# Patient Record
Sex: Female | Born: 1940 | Race: White | Hispanic: No | Marital: Married | Smoking: Current every day smoker
Health system: Southern US, Community
[De-identification: ages and names within clinical notes are randomized; demographics above are authoritative.]

## PROBLEM LIST (undated history)

## (undated) DIAGNOSIS — I471 Supraventricular tachycardia: Secondary | ICD-10-CM

## (undated) DIAGNOSIS — G40309 Generalized idiopathic epilepsy and epileptic syndromes, not intractable, without status epilepticus: Secondary | ICD-10-CM

## (undated) DIAGNOSIS — G4730 Sleep apnea, unspecified: Secondary | ICD-10-CM

## (undated) DIAGNOSIS — G4710 Hypersomnia, unspecified: Secondary | ICD-10-CM

## (undated) DIAGNOSIS — K219 Gastro-esophageal reflux disease without esophagitis: Secondary | ICD-10-CM

## (undated) DIAGNOSIS — T7840XA Allergy, unspecified, initial encounter: Secondary | ICD-10-CM

## (undated) DIAGNOSIS — I6529 Occlusion and stenosis of unspecified carotid artery: Secondary | ICD-10-CM

## (undated) DIAGNOSIS — H269 Unspecified cataract: Secondary | ICD-10-CM

## (undated) DIAGNOSIS — J45909 Unspecified asthma, uncomplicated: Secondary | ICD-10-CM

## (undated) DIAGNOSIS — R1310 Dysphagia, unspecified: Secondary | ICD-10-CM

## (undated) DIAGNOSIS — H9190 Unspecified hearing loss, unspecified ear: Secondary | ICD-10-CM

## (undated) DIAGNOSIS — I00 Rheumatic fever without heart involvement: Secondary | ICD-10-CM

## (undated) DIAGNOSIS — Z974 Presence of external hearing-aid: Secondary | ICD-10-CM

## (undated) DIAGNOSIS — R569 Unspecified convulsions: Secondary | ICD-10-CM

## (undated) DIAGNOSIS — J40 Bronchitis, not specified as acute or chronic: Secondary | ICD-10-CM

## (undated) DIAGNOSIS — R0602 Shortness of breath: Secondary | ICD-10-CM

## (undated) DIAGNOSIS — Z973 Presence of spectacles and contact lenses: Secondary | ICD-10-CM

## (undated) DIAGNOSIS — E785 Hyperlipidemia, unspecified: Secondary | ICD-10-CM

## (undated) DIAGNOSIS — G4734 Idiopathic sleep related nonobstructive alveolar hypoventilation: Secondary | ICD-10-CM

## (undated) DIAGNOSIS — J449 Chronic obstructive pulmonary disease, unspecified: Secondary | ICD-10-CM

## (undated) DIAGNOSIS — I2510 Atherosclerotic heart disease of native coronary artery without angina pectoris: Secondary | ICD-10-CM

## (undated) DIAGNOSIS — R519 Headache, unspecified: Secondary | ICD-10-CM

## (undated) DIAGNOSIS — IMO0001 Reserved for inherently not codable concepts without codable children: Secondary | ICD-10-CM

## (undated) DIAGNOSIS — R112 Nausea with vomiting, unspecified: Secondary | ICD-10-CM

## (undated) DIAGNOSIS — Z9889 Other specified postprocedural states: Secondary | ICD-10-CM

## (undated) DIAGNOSIS — M1990 Unspecified osteoarthritis, unspecified site: Secondary | ICD-10-CM

## (undated) DIAGNOSIS — R51 Headache: Secondary | ICD-10-CM

## (undated) DIAGNOSIS — F419 Anxiety disorder, unspecified: Secondary | ICD-10-CM

## (undated) DIAGNOSIS — I4719 Other supraventricular tachycardia: Secondary | ICD-10-CM

## (undated) DIAGNOSIS — E7800 Pure hypercholesterolemia, unspecified: Secondary | ICD-10-CM

## (undated) DIAGNOSIS — I509 Heart failure, unspecified: Secondary | ICD-10-CM

## (undated) DIAGNOSIS — J4489 Other specified chronic obstructive pulmonary disease: Secondary | ICD-10-CM

## (undated) DIAGNOSIS — C801 Malignant (primary) neoplasm, unspecified: Secondary | ICD-10-CM

## (undated) HISTORY — PX: ABLATION: SHX5711

## (undated) HISTORY — DX: Anxiety disorder, unspecified: F41.9

## (undated) HISTORY — DX: Pure hypercholesterolemia, unspecified: E78.00

## (undated) HISTORY — PX: SKIN CANCER EXCISION: SHX779

## (undated) HISTORY — DX: Other specified chronic obstructive pulmonary disease: J44.89

## (undated) HISTORY — DX: Unspecified osteoarthritis, unspecified site: M19.90

## (undated) HISTORY — DX: Gastro-esophageal reflux disease without esophagitis: K21.9

## (undated) HISTORY — DX: Sleep apnea, unspecified: G47.30

## (undated) HISTORY — DX: Generalized idiopathic epilepsy and epileptic syndromes, not intractable, without status epilepticus: G40.309

## (undated) HISTORY — DX: Reserved for inherently not codable concepts without codable children: IMO0001

## (undated) HISTORY — DX: Occlusion and stenosis of unspecified carotid artery: I65.29

## (undated) HISTORY — PX: TONSILLECTOMY AND ADENOIDECTOMY: SUR1326

## (undated) HISTORY — DX: Atherosclerotic heart disease of native coronary artery without angina pectoris: I25.10

## (undated) HISTORY — DX: Unspecified asthma, uncomplicated: J45.909

## (undated) HISTORY — DX: Chronic obstructive pulmonary disease, unspecified: J44.9

## (undated) HISTORY — DX: Hyperlipidemia, unspecified: E78.5

## (undated) HISTORY — DX: Hypersomnia, unspecified: G47.10

## (undated) HISTORY — DX: Shortness of breath: R06.02

## (undated) HISTORY — PX: HERNIA REPAIR: SHX51

## (undated) HISTORY — DX: Bronchitis, not specified as acute or chronic: J40

## (undated) HISTORY — DX: Heart failure, unspecified: I50.9

## (undated) HISTORY — DX: Allergy, unspecified, initial encounter: T78.40XA

## (undated) HISTORY — PX: ABDOMINAL HYSTERECTOMY: SHX81

## (undated) HISTORY — DX: Dysphagia, unspecified: R13.10

---

## 2009-09-10 ENCOUNTER — Ambulatory Visit: Payer: Self-pay | Admitting: Vascular Surgery

## 2009-12-31 ENCOUNTER — Ambulatory Visit: Payer: Self-pay | Admitting: Vascular Surgery

## 2010-03-11 ENCOUNTER — Ambulatory Visit: Payer: Self-pay | Admitting: Vascular Surgery

## 2010-03-11 DIAGNOSIS — R0602 Shortness of breath: Secondary | ICD-10-CM

## 2010-03-11 HISTORY — DX: Shortness of breath: R06.02

## 2010-10-06 ENCOUNTER — Ambulatory Visit: Payer: Self-pay | Admitting: Vascular Surgery

## 2010-10-26 ENCOUNTER — Ambulatory Visit (HOSPITAL_COMMUNITY)
Admission: RE | Admit: 2010-10-26 | Discharge: 2010-10-26 | Payer: Self-pay | Source: Home / Self Care | Attending: Vascular Surgery | Admitting: Vascular Surgery

## 2010-11-11 ENCOUNTER — Inpatient Hospital Stay (HOSPITAL_COMMUNITY)
Admission: RE | Admit: 2010-11-11 | Discharge: 2010-11-12 | Payer: Self-pay | Source: Home / Self Care | Attending: Vascular Surgery | Admitting: Vascular Surgery

## 2010-11-11 ENCOUNTER — Encounter: Payer: Self-pay | Admitting: Vascular Surgery

## 2010-11-11 HISTORY — PX: CAROTID ENDARTERECTOMY: SUR193

## 2010-11-12 NOTE — Op Note (Signed)
NAME:  [name], [name]            ACCOUNT NO.:  [id number]  MEDICAL RECORD NO.:  [id number]          PATIENT TYPE:  INP  LOCATION:  3303                         FACILITY:  [facility]  PHYSICIAN:  Di Kindle. Edilia Bo, M.D.DATE OF BIRTH:  01-31-1941  DATE OF PROCEDURE:  11/11/2010 DATE OF DISCHARGE:                              OPERATIVE REPORT   PREOPERATIVE DIAGNOSIS:  Symptomatic greater than 80% left carotid stenosis.  POSTOPERATIVE DIAGNOSIS:  Symptomatic greater than 80% left carotid stenosis.  PROCEDURES:  Left carotid endarterectomy with Dacron patch angioplasty.  SURGEON:  Di Kindle. Edilia Bo, MD  ASSISTANT:  Della Goo, PA-C.  ANESTHESIA:  General.  FINDINGS:  95% left carotid stenosis.  INDICATIONS:  This is a 70 year old woman who I have been following with carotid disease.  The carotid stenosis on the left progressed to greater than 80%.  She had had one episode of transient loss of function of theright hand, which lasted several minutes, and it was felt likely related to her left carotid stenosis.  She is brought in for left carotid endarterectomy in order to lower her risk of future stroke.  TECHNIQUE:  The patient was taken to the operating room, received a general anesthetic.  The left neck was prepped and draped in usual sterile fashion.  An incision was made along the anterior border of the sternocleidomastoid and the dissection carried down to the common carotid artery, which was dissected free and controlled with Rumel tourniquet.  Of note, the plaque extended very low in the common carotid artery, and I had to extend the incision and dissect out the common carotid artery quite low.  I then divided the facial vein between 2-0 silk ties and exposed the internal carotid artery above the plaque. There was a very bulky calcific plaque at the bifurcation.  The superior thyroid artery and external carotid arteries were also controlled.  Next, the  patient was heparinized.  Clamp was then placed on the internal then the external and common carotid artery.  A longitudinal arteriotomy was made in the common carotid artery, and this was extended to the plaque into the internal carotid artery.  A long 10 shunt was placed into the internal carotid artery then backbled and then placed in the common carotid artery and secured with Rumel tourniquet.  Flow was reestablished to the shunt.  An endarterectomy plane was established proximally, and the plaque was sharply divided.  Eversion endarterectomy was performed of the external carotid artery.  Distally, there was a nice taper in the plaque, and no tacking sutures were required.  The artery was irrigated with copious amounts of heparin and dextran, and all loose debris removed.  A long Dacron patch was then sewn using continuous 6-0 Prolene suture.  Prior to completing the patch closure, the shunt was removed.  The artery backbled and flushed appropriately, and anastomosis completed.  Flow was reestablished first to the external carotid artery and into the internal carotid artery.  At the completion, there was a good pulse distal to the patch and a good Doppler signal. Hemostasis was obtained in the wound, and the wound was closed with deep layer of 3-0  Vicryl.  The platysma was closed with running 3-0 Vicryl, and the skin closed with 4-0 subcuticular stitch. Sterile dressing was applied.  The patient tolerated the procedure well, was transferred to the recovery room in stable condition.  All needle and sponge counts were correct.     Di Kindle. Edilia Bo, M.D.     CSD/MEDQ  D:  11/11/2010  T:  11/11/2010  Job:  [id number]  cc:   Baldo Daub, MD  Electronically Signed by Waverly Ferrari M.D. on 11/12/2010 10:22:16 AM

## 2010-11-13 NOTE — Consult Note (Signed)
[name], [name] NO.:  [id number]  MEDICAL RECORD NO.:  [id number]          PATIENT TYPE:  INP  LOCATION:  3303                         FACILITY:  [facility]  PHYSICIAN:  Luis Abed, MD, FACCDATE OF BIRTH:  September 14, 1941  DATE OF CONSULTATION:  11/11/2010 DATE OF DISCHARGE:                                CONSULTATION   PRIMARY CARDIOLOGIST:  Dr. Dulce Sellar.  CARDIOVASCULAR THORACIC SURGEON:  Di Kindle. Edilia Bo, MD  REASON FOR CONSULTATION:  Chest discomfort.  HISTORY OF PRESENT ILLNESS:  [name] is a 70 year old Caucasian female with no known cardiac history but other than SVT for which she had ablation approximately 4-6 years ago (no records available or whatever about this SVT 6 years ago), but known carotid artery disease having just had left carotid endarterectomy today and known 60-79% stenosis in the right carotid.  History also is significant for hyperlipidemia, COPD (intermittent home O2 use), and long history of tobacco abuse smoking 2 packs per day since 1984, who presents now with substernal chest discomfort described as tightness in the immediate postop setting.  The patient reports a long history of intermittent chest discomfort that is difficult to pin down in terms of its character.  She describes tightness as well as heaviness in her chest that can be associated with position, deep inspiration, cough, as well as exertion.  She is unsure if her exercise tolerance has changed over the last year but she does note that she has to take breaks when doing her housework but unsure if these breaks have increased in frequency.  She denies lower extremity edema but does report chronic orthopnea.  Denies PND.  No other recent changes like fevers, chills, nausea and vomiting, although today she is mildly nauseous with her symptoms.  She does report a history of minimal diaphoresis, although she has not been diaphoretic today with her  chest discomfort.  She also reports over the last few months minimal symptoms of palpitations but nothing as severe as what she had prior to her ablation.  Currently, she is frustrated as her mouth is very dry and she is having difficulty talking.  She also is frustrated because she is not quite as clear-headed, is normal on pain medication after her left carotid endarterectomy.  The patient is still experiencing her substernal chest discomfort, especially severe with cough..  The patient had EKG completed which shows some very subtle changes in the superior lateral leads when compared to prior tracing.  Mostly nonspecific ST-T wave changes.  No cardiac enzymes are back yet but CBC and CMET unremarkable.  The patient's vital signs are close to within normal limits and stable, but respiration rate is 29, 99% on 2 liters and BP minimally elevated at 134/51.  She appears uncomfortable but not in distress currently.  PAST MEDICAL HISTORY: 1. Carotid artery disease.     a.     Left carotid endarterectomy, November 11, 2010. b.     Right carotid stenosis 60-79%. 2. Hyperlipidemia. 3. COPD (intermittent O2 at home). 4. Ongoing tobacco abuse (54 pack years, 2 ppd currently). 5. AV nodal reentrant SVT.     a.  Ablation 2005 and again in 2007. 6. GERD. 7. Gastritis. 8. Peptic ulcer. 9. History of seizure disorder. 10.S/P C-section x2. 11.S/P foot surgery. 12.S/P hernia repair.  SOCIAL HISTORY:  The patient has a 54 pack-year smoking history, currently smoking 2 ppd, no significant EtOH, no illicit drug use.  No herbal meds, regular diet, no regular exercise but independent in terms of daily activities.  FAMILY HISTORY:  Negative for premature diagnosis of coronary artery disease.  REVIEW OF SYSTEMS:  Please see HPI.  All other systems reviewed were negative.  CODE STATUS:  Full.  ALLERGIES AND INTOLERANCES: 1. CONTRAST MEDIA. 2. AVELOX.  MEDICATIONS: 1. ECASA 325 mg p.o.  daily. 2. Bethanechol 25 mg b.i.d. 3. IV antibiotics. 4. Protonix daily. 5. Dilantin. 6. Spiriva. 7. P.r.n. medications.  PHYSICAL EXAMINATION:  VITAL SIGNS:  Temperature 98.3 degrees Fahrenheit, BP 134/51 with pulse 73 and respiration rate 29, O2 saturation 99% on 2 liters nasal cannula. GENERAL:  The patient is slightly confused and hard of hearing but easily redirected and answers all questions appropriately, appears uncomfortable, and a little lethargic. HEAD:  Normocephalic, atraumatic.  Pupils are equal, round, reactive to light.  Extraocular muscles are intact.  Nares are patent without discharge.  Oropharynx without erythema or exudates. NECK:  Supple without lymphadenopathy.  No thyromegaly. HEART:  Rate is irregular with audible S1, S2 and S4.  No clicks, rubs, murmurs, or gallops.  Pulses are 2+ and equal in both upper and lower extremities bilaterally. LUNGS:  Clear to auscultation bilaterally. SKIN:  No rashes, lesions, or petechiae. ABDOMEN:  Soft, nontender, nondistended.  Normal abdominal bowel sounds. No rebound or guarding.  No hepatosplenomegaly. EXTREMITIES:  Without clubbing, cyanosis, or edema. MUSCULOSKELETAL:  Without joint deformity or effusions.  No spinal or CVA tenderness.  Cranial nerves II-XII grossly intact.  No cranial nerve VIII.  The patient is hard-of-hearing.  Strength 5/5 in all extremities and axial groups, normal sensation and normal cerebellar function.  RADIOLOGY:  None.  EKG:  NSR, 82 bpm, minimal T-wave inversion with less than 1 mm of ST depression in V4-V6 with very subtly changed from prior tracing.  No significant Q-waves.  Normal axis.  No evidence of hypertrophy, PR 142, QRS 70, and QTC 432.  LABORATORY DATA:  WBC is 9.5, HGB 15.4, HCT 45.1, PLT count is 285,000. Sodium 142, potassium 5.0, chloride 103, bicarb 26, BUN 13, creatinine 1.01, glucose 110.  Liver function tests within normal limits.  Protime 12.5, INR 0.91.   Urinalysis with small amount of blood and few squamous epithelial cells.  ASSESSMENT/PLAN:  The patient initially seen by Lenard Galloway, PA-C and then seen and thoroughly assessed by attending cardiologist, Dr. Willa Rough.  [name] is a 70 year old Caucasian female with no known cardiac history other than supraventricular tachycardia for which she underwent ablation in 2005 and 2007, but history significant for carotid artery disease for which she underwent carotid endarterectomy today, chronic obstructive pulmonary disease with home O2 intermittently, hyperlipidemia, and 54 pack-year smoking history ongoing currently at 2 packs per day who presents with chest discomfort with both typical and atypical features in the absence of any convincing objective evidence of ischemia.  Chest pain - would follow enzymes and if negative arrange for followup with Dr. Dulce Sellar.  If positive will need diagnostic left heart catheterization.  It is noteworthy that the patient had a negative nuclear stress test and normal 2-D echocardiogram 1 year ago.  Also symptoms could easily be explained by chronic obstructive pulmonary  disease.     Jarrett Ables, PAC   ______________________________ Luis Abed, MD, FACCMS/MEDQ  D:  11/11/2010  T:  11/12/2010  Job:  [id number]  Electronically Signed by Jarrett Ables PAC on 11/12/2010 01:45:45 PM Electronically Signed by Willa Rough MD FACC on 11/13/2010 09:10:41 AM

## 2010-11-15 LAB — CBC
HCT: 45.1 % (ref 36.0–46.0)
HCT: 35.9 % — ABNORMAL LOW (ref 36.0–46.0)
Hemoglobin: 15.4 g/dL — ABNORMAL HIGH (ref 12.0–15.0)
Hemoglobin: 12.7 g/dL (ref 12.0–15.0)
Hemoglobin: 12.1 g/dL (ref 12.0–15.0)
MCH: 29.9 pg (ref 26.0–34.0)
MCH: 30.3 pg (ref 26.0–34.0)
MCHC: 33.8 g/dL (ref 30.0–36.0)
MCV: 87.6 fL (ref 78.0–100.0)
Platelets: 285 10*3/uL (ref 150–400)
Platelets: 227 10*3/uL (ref 150–400)
RBC: 5.15 MIL/uL — ABNORMAL HIGH (ref 3.87–5.11)
RBC: 4 MIL/uL (ref 3.87–5.11)
WBC: 9.5 10*3/uL (ref 4.0–10.5)
WBC: 15.7 10*3/uL — ABNORMAL HIGH (ref 4.0–10.5)
WBC: 11.7 10*3/uL — ABNORMAL HIGH (ref 4.0–10.5)

## 2010-11-15 LAB — COMPREHENSIVE METABOLIC PANEL
ALT: 23 U/L (ref 0–35)
AST: 26 U/L (ref 0–37)
Albumin: 4.2 g/dL (ref 3.5–5.2)
Calcium: 9.5 mg/dL (ref 8.4–10.5)
Chloride: 103 mEq/L (ref 96–112)
Creatinine, Ser: 1.01 mg/dL (ref 0.4–1.2)
Sodium: 142 mEq/L (ref 135–145)
Total Bilirubin: 0.7 mg/dL (ref 0.3–1.2)

## 2010-11-15 LAB — BASIC METABOLIC PANEL
BUN: 8 mg/dL (ref 6–23)
BUN: 4 mg/dL — ABNORMAL LOW (ref 6–23)
CO2: 25 mEq/L (ref 19–32)
Calcium: 8 mg/dL — ABNORMAL LOW (ref 8.4–10.5)
Calcium: 7.6 mg/dL — ABNORMAL LOW (ref 8.4–10.5)
Chloride: 107 mEq/L (ref 96–112)
Creatinine, Ser: 0.81 mg/dL (ref 0.4–1.2)
Creatinine, Ser: 0.72 mg/dL (ref 0.4–1.2)
GFR calc Af Amer: >60 mL/min (ref >60)
GFR calc non Af Amer: >60 mL/min (ref >60)
GFR calc non Af Amer: >60 mL/min (ref >60)
Potassium: 4 mEq/L (ref 3.5–5.1)

## 2010-11-15 LAB — CARDIAC PANEL(CRET KIN+CKTOT+MB+TROPI)
CK, MB: 1.6 ng/mL (ref 0.3–4.0)
Total CK: 108 U/L (ref 7–177)
Troponin I: 0.02 ng/mL (ref 0.00–0.06)
Troponin I: <0.01 ng/mL (ref 0.00–0.06)

## 2010-11-15 LAB — URINALYSIS, ROUTINE W REFLEX MICROSCOPIC
Nitrite: NEGATIVE
Protein, ur: NEGATIVE mg/dL

## 2010-11-15 LAB — TYPE AND SCREEN
ABO/RH(D): O POS
Antibody Screen: NEGATIVE

## 2010-11-15 LAB — URINE MICROSCOPIC-ADD ON

## 2010-11-16 NOTE — Discharge Summary (Signed)
NAME:  [name], AVERY            ACCOUNT NO.:  [id number]  MEDICAL RECORD NO.:  [id number]          PATIENT TYPE:  INP  LOCATION:  3303                         FACILITY:  [facility]  PHYSICIAN:  Di Kindle. Edilia Bo, M.D.DATE OF BIRTH:  05-07-1941  DATE OF ADMISSION:  11/11/2010 DATE OF DISCHARGE:  11/12/2010                              DISCHARGE SUMMARY   REASON FOR ADMISSION:  Symptomatic left carotid stenosis.  HISTORY:  This is a pleasant 70 year old woman who was seen with a greater than 80% left carotid stenosis.  She had had a previous episode of transient loss of function in her right hand which had lasted several minutes, this was in the past.  She has had no further episodes.  Given the severity of the carotid stenosis and her previous symptoms, left carotid endarterectomy was recommended in order to lower her risk of future stroke.  Remainder of the history and physical is as dictated without addition or deletion.  HOSPITAL COURSE:  The patient was admitted on November 11, 2010.  She underwent an uneventful left carotid endarterectomy with Dacron patch angioplasty.  Of note, it is a very long stenosis and required extensive endarterectomy extending well down into the common carotid artery. However, she remained hemodynamically stable throughout the procedure and awoke neurologically intact.  She was monitored in the recovery room where she remained stable.  She was then transferred to the step-down unit.  Once she got to the step-down unit, she began complaining of some substernal chest pressure.  An EKG was obtained which showed some very subtle changes and nonspecific ST-T wave abnormalities with possible lateral ischemia.  She was started on intravenous nitroglycerin. Cardiac enzymes were obtained, and Cardiology consult was consulted. They evaluated the patient.  Her enzymes remained negative, and she remained stable.  On postoperative day #1, Cardiology evaluated  the patient and felt that her chest pain was not cardiac in origin.  She continued to have some vague chest pain; however, she is somewhat of a difficult historian.  She remained neurologically intact.  She was eating without difficulty, voiding and ambulating, and she was discharged home with instructions to follow up in 2 weeks in our office. She has also been asked to follow up with Dr. Dulce Sellar in [facility] her cardiologist.  DISCHARGE DIAGNOSIS:  Symptomatic left carotid stenosis.  SECONDARY DIAGNOSES: 1. Hyperlipidemia. 2. Chronic obstructive pulmonary disease. 3. History of ongoing tobacco abuse. 4. History of atrioventricular nodal reentrant supraventricular     tachycardia status post ablation in 2005 and 2007.  CONDITION ON DISCHARGE:  Good.  PROCEDURES THIS ADMISSION:  Left carotid endarterectomy with Dacron patch angioplasty on November 11, 2010.  MEDICATIONS ON DISCHARGE: 1. L-arginine 500 mg p.o. b.i.d. 2. Mucinex 1200 mg p.o. every 12 hours. 3. Alendronate 70 mg p.o. weekly. 4. Spiriva 18 mcg inhaled p.o. daily. 5. Proventil inhaler 2 puffs daily as needed. 6. Vitamin E over the counter one p.o. daily. 7. Bethanechol 25 mg p.o. b.i.d. 8. Nexium 40 mg p.o. nightly. 9. Aspirin 81 mg p.o. 2-3 times a day. 10.Dilantin 100 mg p.o. q.i.d.  CONDITION ON DISCHARGE:  Good.  PERTINENT LABORATORY EVALUATION:  This admission includes negative cardiac enzymes as described above.  Creatinine 0.72.  Hemoglobin is 12.1 on discharge.     Di Kindle. Edilia Bo, M.D.     CSD/MEDQ  D:  11/12/2010  T:  11/13/2010  Job:  [id number]  cc:   Dr. Norman Herrlich  Electronically Signed by Waverly Ferrari M.D. on 11/16/2010 03:25:57 PM

## 2010-12-01 ENCOUNTER — Ambulatory Visit (INDEPENDENT_AMBULATORY_CARE_PROVIDER_SITE_OTHER): Payer: PRIVATE HEALTH INSURANCE | Admitting: Vascular Surgery

## 2010-12-01 DIAGNOSIS — I6529 Occlusion and stenosis of unspecified carotid artery: Secondary | ICD-10-CM

## 2010-12-03 NOTE — Assessment & Plan Note (Signed)
OFFICE VISIT  [name], [name] DOB:  12-Sep-1941                                       12/01/2010 NFAOZ#:[id number]  I saw this patient in the office today for follow-up after her recent left carotid endarterectomy.  This is a pleasant 69-year woman whom I have been following with carotid disease.  The stenosis on the left progressed to greater than 80%.  She had had one episode of transient loss of function in the right hand which lasted several minutes and it was felt that the left carotid stenosis could be symptomatic.  She underwent elective left carotid endarterectomy with Dacron patch angioplasty on 11/11/2010.  She did well postoperatively and returns for her first outpatient visit.  Overall she has been doing quite well has no specific complaint.  PHYSICAL EXAMINATION:  Blood pressure is 132/84, heart rate is 74, temperature 98.1.  The neck incision is healed nicely.  She has no focal neurologic deficits.  Lungs:  Clear bilaterally to auscultation.  Overall I am pleased with her progress.  She does have a known 60-79% right carotid stenosis and will follow this closely.  She will be due for carotid duplex scan in 6 months which I have ordered.  She knows to call sooner if she has problems.  In the meantime she knows to continue taking her aspirin.    Di Kindle. Edilia Bo, M.D. Electronically Signed  CSD/MEDQ  D:  12/01/2010  T:  12/02/2010  Job:  [id number]  cc:   Norman Herrlich, M.D.

## 2011-01-03 LAB — COMPREHENSIVE METABOLIC PANEL
ALT: 19 U/L (ref 0–35)
AST: 24 U/L (ref 0–37)
Albumin: 3.9 g/dL (ref 3.5–5.2)
BUN: 11 mg/dL (ref 6–23)
Creatinine, Ser: 0.91 mg/dL (ref 0.4–1.2)
Glucose, Bld: 76 mg/dL (ref 70–99)
Sodium: 137 mEq/L (ref 135–145)
Total Bilirubin: 0.3 mg/dL (ref 0.3–1.2)
Total Protein: 6.7 g/dL (ref 6.0–8.3)

## 2011-01-03 LAB — CBC
HCT: 44.2 % (ref 36.0–46.0)
Hemoglobin: 15.3 g/dL — ABNORMAL HIGH (ref 12.0–15.0)
MCHC: 34.6 g/dL (ref 30.0–36.0)
MCV: 88.9 fL (ref 78.0–100.0)
RBC: 4.97 MIL/uL (ref 3.87–5.11)

## 2011-01-03 LAB — PROTIME-INR: Prothrombin Time: 12.6 seconds (ref 11.6–15.2)

## 2011-01-03 LAB — SURGICAL PCR SCREEN
MRSA, PCR: NEGATIVE
Staphylococcus aureus: NEGATIVE

## 2011-01-03 LAB — URINALYSIS, ROUTINE W REFLEX MICROSCOPIC
Hgb urine dipstick: NEGATIVE
Protein, ur: NEGATIVE mg/dL
Urobilinogen, UA: 0.2 mg/dL (ref 0.0–1.0)
pH: 7 (ref 5.0–8.0)

## 2011-01-03 LAB — TYPE AND SCREEN
ABO/RH(D): O POS
Antibody Screen: NEGATIVE

## 2011-03-08 NOTE — H&P (Signed)
HISTORY AND PHYSICAL EXAMINATION   Mar 11, 2010   Re:  [name], [name]              DOB:  August 10, 1941   I saw the patient in the office today for continued followup of her  carotid disease.  I have been following her with bilateral carotid  disease.  I had originally seen her in consultation in November of 2010.  She had one brief episode of loss of function of the right hand but at  the time was under a tremendous amount of stress and it was felt that it  could potentially be related to this.  Her carotid duplex scan showed a  less than 80% left carotid stenosis.  We were considering carotid  endarterectomy however she had no further symptoms and therefore we  elected to simply see her back in 6 months with a followup duplex scan.  Since I saw her last she has had no history of stroke, TIAs, expressive  or receptive aphasia or amaurosis fugax.  She continues to be under a  lot of stress and has had several deaths in the family.   PAST MEDICAL HISTORY:  Her past medical history is significant for  hypercholesterolemia and COPD.  She also had an ablation procedure I  believe by Dr. Rudolpho Sevin in [facility].  She has had no history of  myocardial infarction, congestive heart failure or diabetes.   FAMILY HISTORY:  She is unaware of any history of premature  cardiovascular disease.   SOCIAL HISTORY:  She is married.  She has five children.  She smokes two  packs per day of cigarettes and has been smoking since she was a  teenager.   ALLERGIES:  She has no known drug allergies.   MEDICATIONS:  1. Advair inhaler p.r.n.  2. Proventil inhaler p.r.n.  3. Dilantin 100 mg 2 tablets p.o. t.i.d.  4. Nexium 40 mg p.o. daily.  5. Aspirin 81 mg p.o. b.i.d.   REVIEW OF SYSTEMS:  GENERAL:  She has had no recent weight loss, weight  gain or problems with her appetite.  CARDIOVASCULAR:  She has had no recent chest pain, chest pressure,  palpitations or arrhythmias.  She does  admit to dyspnea on exertion.  PULMONARY:  She occasionally uses home O2.  She has had occasional  bronchitis and occasional wheezing and asthma.  GI:  She has a history of reflux.  GU, neurologic, musculoskeletal, psychiatric, ENT, hematologic review of  systems is unremarkable.   PHYSICAL EXAMINATION:  General:  This is a pleasant 70 year old who  appears her stated age.  Vital signs:  Her blood pressure is 108/72,  heart rate 74, respiratory rate 16.  HEENT:  Unremarkable.  Lungs:  Are  clear bilaterally to auscultation without rales, rhonchi or wheezing.  Cardiovascular:  She has bilateral carotid bruits.  She has a regular  rate and rhythm.  She has palpable femoral pulses and warm and well-  perfused feet without ischemic ulcers.  Neurologic:  She has no focal  weakness or paresthesias.   I have independently interpreted her carotid duplex scan today which  shows that her left carotid stenosis has progressed significantly.  She  now has a greater than 80% left carotid stenosis.  Peak systolic  velocity is 410 cm/sec with an end diastolic velocity of 185 cm/sec.  Both vertebral arteries are patent with normally directed flow.  She  does have a 60%-79% right carotid stenosis.   Given the  progression of the left carotid stenosis to greater than 80% I  have recommended left carotid endarterectomy in order to lower her risk  of future stroke.  We have discussed the indications for the procedure  and the potential complications including but not limited to stroke  (periprocedural risk 1%-2%), MI, nerve injury, bleeding, or other  unpredictable medical problems.  All of her questions were answered and  she is agreeable to proceed.  She knows to continue her aspirin right up  through surgery.  Her surgery has been scheduled for June 3.     Di Kindle. Edilia Bo, M.D.  Electronically Signed   CSD/MEDQ  D:  03/11/2010  T:  03/12/2010  Job:  [id number]   cc:   Dr Rudolpho Sevin  Dr Foye Deer  Dr Norman Herrlich

## 2011-03-08 NOTE — Procedures (Signed)
CAROTID DUPLEX EXAM   INDICATION:  Carotid disease.   HISTORY:  Diabetes:  No.  Cardiac:  No.  Hypertension:  No.  Smoking:  Yes.  Previous Surgery:  No.  CV History:  Currently asymptomatic.  Amaurosis Fugax No, Paresthesias No, Hemiparesis No                                       RIGHT             LEFT  Brachial systolic pressure:         122               124  Brachial Doppler waveforms:         Normal            Normal  Vertebral direction of flow:        Antegrade         Antegrade  DUPLEX VELOCITIES (cm/sec)  CCA peak systolic                   67                66  ECA peak systolic                   141               150  ICA peak systolic                   169               261  ICA end diastolic                   67                100  PLAQUE MORPHOLOGY:                  Mixed             Calcific  PLAQUE AMOUNT:                      Moderate          Moderate / severe  PLAQUE LOCATION:                    ICA / ECA         ICA / ECA / CCA   IMPRESSION:  1. 40%-59% stenosis of the right proximal internal carotid artery.  2. Doppler velocity suggests a 60%-79% stenosis of the left proximal      internal carotid artery, however, the stenosis may be      underestimated due to overlying calcific plaque shadowing.   ___________________________________________  Di Kindle. Edilia Bo, M.D.   CH/MEDQ  D:  09/10/2009  T:  09/10/2009  Job:  [id number]

## 2011-03-08 NOTE — Consult Note (Signed)
NEW PATIENT CONSULTATION   [name], [name]  DOB:  Jul 07, 1941                                       09/10/2009  CHART#:[id number]   I saw the patient in the office today in consultation concerning  bilateral carotid disease.  This is a pleasant 70 year old right-handed  woman who when she was driving had a very brief episode where she could  not see that lasted only a second or two.  I do not get any clear-cut  history of amaurosis fugax.  Her symptoms were not limited to one side.  She denies any previous history of stroke or history of expressive or  receptive aphasia.  She eventually acknowledged that she had had an  episode about a month ago where she had temporarily lost function of her  hand for a few minutes where she could not hold her cigarette and I  think this very well may have been a left hemispheric TIA.  She felt she  has been under a lot of stress because of her mother's been sick and she  tries to attribute most of her symptoms to stress.   PAST MEDICAL HISTORY:  Is significant for hypercholesterolemia and COPD.  In addition, she has had some arrhythmias and has been worked up by Dr.  Philipp Deputy at [facility] for I believe an ablation procedure.  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  Her cardiologist is Dr. Dulce Sellar.  She  denies any history of diabetes, hypertension, history of previous  myocardial infarction or history of congestive heart failure.   FAMILY HISTORY:  There is no history of premature cardiovascular  disease.   SOCIAL HISTORY:  She is married.  She has 5 children.  She smokes 2  packs per day of cigarettes and has been smoking for as long as she can  remember.   ALLERGIES:  No known drug allergies.   MEDICATIONS:  Are documented on the medical history form in her chart.   REVIEW OF SYSTEMS:  GENERAL:  She says she has had some weight loss and  has lost about 15 pounds in the last 6 months which she attributes to  poor  appetite.  She has had no fever.  CARDIAC:  She admits to occasional chest pain and chest pressure with  sometimes pressure in the left shoulder.  She admits to orthopnea and  dyspnea on exertion.  PULMONARY:  She is on home O2 p.r.n. and has had occasional episodes of  bronchitis.  GI:  She has a history of reflux.  VASCULAR:  She does admit to claudication and some rest pain in her  feet.  She has had no history of DVT or phlebitis.  NEUROLOGIC:  She has occasional headaches and has had seizures in the  past.  MUSCULOSKELETAL:  She does have arthritis and osteoporosis.  GU, psychiatric, ENT, hematologic and integument review of systems is  unremarkable.   PHYSICAL EXAMINATION:  General:  This is a pleasant 70 year old woman  who appears her stated age.  Vital signs:  Blood pressure 133/84, heart  rate is 73, temperature is 97.6.  HEENT:  Extraocular motions are  intact.  Conjunctivae are normal.  There is no facial asymmetry.  Neck:  Supple.  There is no jugular venous distention.  There is no cervical  lymphadenopathy.  Lungs:  Are clear bilaterally  to auscultation without  rales, rhonchi or wheezing.  Cardiovascular:  She has a harsh left  carotid bruit with a softer right carotid bruit.  She has a regular rate  and rhythm without murmur appreciated.  She has no significant  peripheral edema.  She has slight diminished femoral pulses.  I cannot  palpate pedal pulses although both feet appear adequately perfused  without ischemic ulcers.  Abdomen:  Soft and nontender.  I cannot  palpate an aneurysm.  She has normal pitched bowel sounds.  Musculoskeletal:  There are no major deformities or cyanosis.  Neurological:  Exam is nonfocal with no focal weakness or paresthesias.  Skin:  She has no rashes or ulcers.   She did have a carotid duplex scan done at [facility] which  suggested a 60%-79% right carotid stenosis with an 80%-99% left carotid  stenosis based on velocity  criteria.  This study was done on September 04, 2009.  We repeated her carotid duplex scan here which I have  independently interpreted and this showed only 40%-59% carotid stenosis  on the right with a 60%-79% left carotid stenosis.  It is certainly  possible that if she had a hemorrhagic plaque which disrupted that this  could explain the change in the findings on her carotid duplex scan  although it could simply be difference in lab technique.   I have also reviewed her lab studies which show a normal creatinine and  no significant anemia.   Given her symptoms of temporary weakness in the right hand with a  significant left carotid stenosis I think consideration should be given  to left carotid endarterectomy in order to lower her risk of future  stroke.  I feel the risk of stroke without surgery is approximately 10%  in the next year and then 5% per year thereafter.  I have explained that  we can significantly lower her risk of stroke with carotid  endarterectomy and I have reviewed the procedure and the potential  complications including but not limited to bleeding, stroke (peri  procedural risk 1%-2%), nerve injury, MI or other unpredictable medical  problems.  However, I am somewhat concerned about her cardiac history  and she tells me she has occasional chest pain and chest pressure  sometimes with radiation to the left shoulder.  I have asked for her to  make an appointment with Dr. Dulce Sellar to assess her for a preoperative  cardiac evaluation.  Once this workup is complete I will see her back  and we will discuss potentially proceeding with left carotid  endarterectomy.  In the meantime I have encouraged her to continue  taking her aspirin and we have had a long discussion about the  importance of tobacco cessation.  If she had an embolic event from her  left carotid plaque that the stenosis could change.  She denies any  history of diabetes.  She has had no previous history of  stroke, TIAs,  expressive or receptive aphasia.  She did tell me after...   Di Kindle. Edilia Bo, M.D.  Electronically Signed   CSD/MEDQ  D:  09/10/2009  T:  09/11/2009  Job:  [id number]   cc:   Dr Foye Deer  Dr Norman Herrlich  Dr Philipp Deputy

## 2011-03-08 NOTE — Procedures (Signed)
CAROTID DUPLEX EXAM   INDICATION:  Follow up known carotid disease.   HISTORY:  Diabetes:  No.  Cardiac:  No.  Hypertension:  No.  Smoking:  Yes.  Previous Surgery:  No.  CV History:  Asymptomatic.  Amaurosis Fugax No, Paresthesias No, Hemiparesis No.                                       RIGHT             LEFT  Brachial systolic pressure:         125               149  Brachial Doppler waveforms:         Normal            Normal  Vertebral direction of flow:        Antegrade         Antegrade  DUPLEX VELOCITIES (cm/sec)  CCA peak systolic                   61                67  ECA peak systolic                   82                110  ICA peak systolic                   231               369  ICA end diastolic                   97                186  PLAQUE MORPHOLOGY:                  Mixed             Mixed  PLAQUE AMOUNT:                      Moderate-to-severe                  Severe  PLAQUE LOCATION:                    ICA, ECA, CCA     ICA, ECA, CCA   IMPRESSION:  1. Doppler velocities suggest 60% to 79% stenosis in the right      internal carotid artery.  2. Doppler velocities suggest 80% to 99% stenosis in the left internal      carotid artery.  3. Antegrade flow noted in the bilateral vertebral arteries.  4. No significant change from previous examination.   ___________________________________________  Di Kindle. Edilia Bo, M.D.   NT/MEDQ  D:  10/06/2010  T:  10/06/2010  Job:  [id number]

## 2011-03-08 NOTE — Assessment & Plan Note (Signed)
OFFICE VISIT   AMBUR, PROVINCE  DOB:  [birth year]                                       12/31/2009  WJXBJ#:[id number]   I saw the patient in the office today for followup of her carotid  disease.  I had originally seen her in consultation on 09/10/2009.  She  had had an episode a month prior to this visit in October when she  temporarily lost function of her right hand for a few minutes and could  not hold her cigarette.  Of note, she had been under tremendous amount  of stress at that time as her mother was sick and initially she  attributed her symptoms to stress.  She ultimately underwent a carotid  duplex scan at [facility] which showed a greater than 80% left  carotid stenosis by velocity criteria.  She was later referred to our  office.  We repeated the duplex scan and the stenosis on the left was in  the 60%-79% range but was clearly less than 80%.  We were considering  left carotid endarterectomy, however, I was concerned because she had  been having some chest pain.  She was referred to Dr. Dulce Sellar and  underwent a stress test which showed no evidence of ischemia.  This was  done on January 18.  She returns now today for continued followup of her  carotid disease.   Since I saw her last she has had no history of stroke, TIAs, expressive  or receptive aphasia, or amaurosis fugax.  Thus it has been  approximately 4 months since her brief episode of weakness in the right  hand.   SOCIAL HISTORY:  She is married.  She has five children.  She smokes two  packs per day of cigarettes.  She does not drink alcohol on a regular  basis.   REVIEW OF SYSTEMS:  CARDIOVASCULAR:  She has had no further chest pain  or chest pressure.  She has had no palpitations or arrhythmias.  She has  no orthopnea or dyspnea on exertion.  She has had no claudication, rest  pain or nonhealing ulcers.  She has had no history of DVT.  PULMONARY:  She has had no  productive cough, bronchitis, asthma or  wheezing.   PHYSICAL EXAMINATION:  General:  This is a pleasant day 70 year old  woman who appears her stated age.  Vital signs:  Blood pressure is  133/100 in the right arm and 148/80 in the left arm.  Heart rate is 77.  Respiratory rate 16.  Lungs:  Are clear bilaterally to auscultation  without rales, rhonchi or wheezing.  Cardiovascular:  She has bilateral  carotid bruits.  She has a regular rate and rhythm without murmur  appreciated.  She has palpable femoral pulses.  I cannot palpable pedal  pulses, however, both feet are adequately perfused.  Abdomen:  Soft and  nontender with normal pitched bowel sounds.  Neurologic:  Exam is  nonfocal.   Given that she has had no further symptoms and the stenosis on the left  is less than 80% I have recommended that we simply obtain a carotid  duplex scan in 2 months which would be 6 months from her last study.  She was under tremendous amount of stress when she had her brief episode  in October and her symptoms could simply be  related to this.  If her  stenosis on the left does progress to greater than 80% then I think we  should consider carotid endarterectomy or obviously if she develops any  new left hemispheric symptoms.  However, at this point she has not had  any further symptoms and the duplex suggests the stenosis is less than  80%.  I will see her back in 2 months with followup duplex scan.  She  knows to call sooner if she has problems.     Di Kindle. Edilia Bo, M.D.  Electronically Signed   CSD/MEDQ  D:  12/31/2009  T:  01/01/2010  Job:  [id number]   cc:   Dr Norman Herrlich  Dr Foye Deer  Dr Jeanne Ivan

## 2011-03-08 NOTE — H&P (Signed)
HISTORY AND PHYSICAL EXAMINATION   October 06, 2010   Re:  [name], Wallace              DOB:  11-11-1940   I saw Ms. [name] in the office today for any follow-up of her carotid  disease.  This is a pleasant 70 year old woman who I had last seen in  March 2011 with a less than 80% left carotid stenosis and 60-79% right  carotid stenosis.  On follow-up duplex scan in May 2011, the stenosis on  the left had progressed to greater than 80% and we discussed proceeding  with left carotid endarterectomy.  She subsequently developed bronchitis  and then was transiently lost to follow-up.  She returns today to  discuss potentially proceeding with left carotid endarterectomy.  Since  I saw her in March, she has had no history of stroke, TIAs, expressive  or receptive aphasia, or amaurosis fugax.  She has had 1 episode in the  past where she had a transient loss of function in her right hand which  lasted several minutes.  She has had no further episodes.  She has  recently been treated for bronchitis and this appears to be improving.  She says she typically gets bronchitis in the spring and the fall.   PAST MEDICAL HISTORY:  Significant for hypercholesterolemia and COPD.  She has also had an ablation procedure in the past and cardiologist is  Dr. Dulce Sellar.  I believe Dr. __________  performed her ablation procedure  in [facility].  She denies any previous history of myocardial  infarction, history of congestive heart failure.   SOCIAL HISTORY:  She is married.  She has 5 children.  She smokes 2 pack  per day of cigarettes.  She has never had success in quitting.   FAMILY HISTORY:  There is no history of premature cardiovascular  disease.   ALLERGIES:  Codeine.   MEDICATIONS:  1. Alendronate sodium 70 mg weekly.  2. Advair inhaler p.r.n.  3. Proventil inhaler p.r.n.  4. Dilantin mg 2 capsules p.o. t.i.d.  5. Nexium 40 mg daily p.r.n.  6. Guaifenesin 400 mg p.o. q.4  h p.r.n. cough.  7. Aspirin 81 mg p.o. b.i.d.  8. Vitamin E 800 international units daily.  9. L-Arginine daily.   REVIEW OF SYSTEMS:  GENERAL:  She had no recent weight loss, weight gain  or problems with her appetite.  CARDIOVASCULAR:  She has had no chest pain, chest pressure or  palpitations.  She does admit to some dyspnea on exertion and orthopnea.  She has had no claudication, rest pain or nonhealing ulcers.  She has  had a previous DVT in the past.  GI: She does have a history of reflux and occasional problems  swallowing.  NEUROLOGIC:  She has had headaches and seizures in the past.  PULMONARY:  She has had bronchitis recently and is recovering from this.  She has occasional episodes of asthma or wheezing.  GU: She has some dysuria.  ENT: She has some gradual change her eyesight but no sudden change.  MUSCULOSKELETAL:  She does have arthritis.  Hematologic, psychiatric, integumentary review of systems is  unremarkable and documented on the medical history form in the chart.   PHYSICAL EXAMINATION:  This is a pleasant 70 year old who appears her  stated age.  Blood pressure is 125/77, heart rate is 75, respiratory rate is 24.  HEENT:  Unremarkable.  LUNGS:  Clear bilaterally to auscultation without rales, rhonchi or  wheezing.  CARDIOVASCULAR:  She has a left carotid bruit.  She has a regular rate  and rhythm.  She has palpable femoral pulses and warm well-perfused feet  without ischemic ulcers.  She has no significant lower extremity  swelling.  ABDOMEN:  Soft, nontender with normal pitched bowel sounds.  MUSCULOSKELETAL:  No major deformities or cyanosis.  NEUROLOGIC:  She has no focal weakness or paresthesias.  SKIN:  There are no ulcers or rashes.   I have independently interpreted her carotid duplex scan today which  shows a greater than 80% left carotid stenosis with a 60-79% right  carotid stenosis.  End-diastolic velocity on the left is 186 cm/sec  suggesting a  very tight stenosis.  Both vertebral arteries are patent  with normally directed flow.   Given the severity of the left carotid stenosis, I have recommended left  carotid endarterectomy in order to lower her risk of future stroke.  We  have discussed indications for procedure and the potential complications  including but not limited to bleeding, stroke (peri procedural risk of  2%), nerve injury, MI, or other unpredictable medical problems.  All  questions were answered and she is agreeable to proceed.  She is  recovering from bronchitis and would prefer to wait until early January  to schedule her surgery.  Her surgery has tentatively been scheduled for  January 3.  She knows to continue taking her aspirin and let us know if  she develops any new neurologic symptoms.     Di Kindle. Edilia Bo, M.D.  Electronically Signed   CSD/MEDQ  D:  10/06/2010  T:  10/07/2010  Job:  [id number]   cc:   Dr. Norman Herrlich

## 2011-03-08 NOTE — Procedures (Signed)
CAROTID DUPLEX EXAM   INDICATION:  Followup known carotid disease.   HISTORY:  Diabetes:  No.  Cardiac:  No.  Hypertension:  No.  Smoking:  Yes.  Previous Surgery:  No.  CV History:  Asymptomatic.  Amaurosis Fugax No, Paresthesias No, Hemiparesis No                                       RIGHT             LEFT  Brachial systolic pressure:         120               122  Brachial Doppler waveforms:         Triphasic         Triphasic  Vertebral direction of flow:        Antegrade         Antegrade  DUPLEX VELOCITIES (cm/sec)  CCA peak systolic                   94                75  ECA peak systolic                   116               178  ICA peak systolic                   205               410  ICA end diastolic                   83                185  PLAQUE MORPHOLOGY:                  Mixed             Mixed  PLAQUE AMOUNT:                      Mild to severe    Severe  PLAQUE LOCATION:                    ICA, ECA          ICA, ECA, CCA   IMPRESSION:  1. Right internal carotid artery velocity suggests 60%-79% stenosis.  2. Left internal carotid artery velocity suggests 80%-99% stenosis.  3. Antegrade flow in bilateral vertebral arteries.  4. Significant change from previous exam.   ___________________________________________  Di Kindle. Edilia Bo, M.D.   NT/MEDQ  D:  03/11/2010  T:  03/11/2010  Job:  [id number]

## 2011-06-01 ENCOUNTER — Other Ambulatory Visit: Payer: PRIVATE HEALTH INSURANCE

## 2011-06-01 ENCOUNTER — Ambulatory Visit: Payer: PRIVATE HEALTH INSURANCE | Admitting: Vascular Surgery

## 2011-06-02 ENCOUNTER — Encounter: Payer: Self-pay | Admitting: Physician Assistant

## 2011-06-03 ENCOUNTER — Encounter: Payer: Self-pay | Admitting: Physician Assistant

## 2011-06-03 ENCOUNTER — Other Ambulatory Visit (INDEPENDENT_AMBULATORY_CARE_PROVIDER_SITE_OTHER): Payer: Medicare Other

## 2011-06-03 ENCOUNTER — Ambulatory Visit (INDEPENDENT_AMBULATORY_CARE_PROVIDER_SITE_OTHER): Payer: Medicare Other | Admitting: Physician Assistant

## 2011-06-03 VITALS — BP 144/82 | HR 73

## 2011-06-03 DIAGNOSIS — I6529 Occlusion and stenosis of unspecified carotid artery: Secondary | ICD-10-CM

## 2011-06-03 DIAGNOSIS — Z48812 Encounter for surgical aftercare following surgery on the circulatory system: Secondary | ICD-10-CM

## 2011-06-03 NOTE — Progress Notes (Signed)
Subjective:     Patient ID: [name], female   DOB: [birth year], 70 y.o.   MRN: [id number]  HPI  Pt presents for 6 month f/u of known carotid stenosis with duplex.  She is s/p left CEA 10/2010 by [name].  Prior to surgery she had complained of one episode of transient loss of function in the right hand which lasted several minutes and this was thought to be due to the left carotid stenosis.  At her initial f/u after surgery she was without complaint.  Due to her 60-79% RICAS she was scheduled for repeat duplex and f/u in 6 months.  Today she complains of multiple non focal neurologic sx including posterior headaches almost daily, intermittent loss of function in the right hand, "cloudy" vision, and questionable aphasia.  She denies frank loss of vision and states she does have cataracts.  She denies facial droop, CP, SOB, N/V, and claudication.  She continues to smoke 2 ppd and states that she is "down from 4 packs per day".    Carotid duplex today reveals RICAS of 60-79% which is unchanged since 09/2010 and patent LICA with endarterectomy. However, there are elevated velocities in the mid segment suggestive of stenosis in the 40-59% range. There is patent antegrade vertebrals bilaterally.  Past Medical History  Diagnosis Date  . COPD (chronic obstructive pulmonary disease)   . Reflux   . Shortness of breath 03/11/2010    while laying still  . Hyperlipidemia   . Osteoporosis   . Arthritis   . Bronchitis   . Anxiety   . Pure hypercholesterolemia   . Hypersomnia with sleep apnea, unspecified   . Dysphagia, unspecified   . Chronic airway obstruction, not elsewhere classified   . Generalized convulsive epilepsy without mention of intractable epilepsy     Past Surgical History  Procedure Date  . Carotid endarterectomy 11/11/2010    left with Dacron patch angioplasty  . Hernia repair   . Cesarean section     2 c-sections  . Tonsillectomy and adenoidectomy   . Abdominal  hysterectomy    Allergies  Allergen Reactions  . Codeine    History   Social History  . Marital Status: Married    Spouse Name: N/A    Number of Children: N/A  . Years of Education: N/A   Occupational History  . Not on file.   Social History Main Topics  . 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 Smokeless tobacco: Not on file  . Alcohol Use: No  . Drug Use: Not on file  . Sexually Active:    Other Topics Concern  . Not on file   Social History Narrative  . No narrative on file     Review of Systems  Respiratory: Negative.   Cardiovascular: Negative.   Gastrointestinal: Negative.   Neurological: Positive for weakness, numbness and headaches. Negative for dizziness, tremors, seizures, syncope, facial asymmetry, speech difficulty and light-headedness.  Hematological: Negative.   Psychiatric/Behavioral: Negative.        Objective:   Physical Exam  Constitutional: She is oriented to person, place, and time. She appears well-developed and well-nourished.  HENT:  Head: Normocephalic and atraumatic.  Eyes: EOM are normal. Pupils are equal, round, and reactive to light.  Neck: Normal range of motion. No JVD present.  Cardiovascular: Normal rate and regular rhythm.   No murmur heard. Pulmonary/Chest: Effort normal and breath sounds normal.  Abdominal:  Soft. Bowel sounds are normal. There is no tenderness. There is no guarding.  Musculoskeletal: Normal range of motion.  Neurological: She is alert and oriented to person, place, and time. No cranial nerve deficit. Coordination normal.  Skin: Skin is warm and dry.  Psychiatric: Her behavior is normal. Thought content normal.  Left carotid incision well healed.     Assessment:     ECCVOD s/p left CEA 10/2010 with recurrent stenosis at mid segment and unchanged RICAS 60-79%.  Pt has multiple non focal neurologic sx.      Plan:     Smoking cessation  discussed Refer to neurology for eval of neurologic sx and r/o TIAs F/U with [name] in 3 months with repeat carotid duplex Pt knows to call with questions, issues, or problems. Plan discussed with Dr. Imogene Burn who is in agreement.  Dr. Imogene Burn is the physician in the clinic today.

## 2011-06-13 NOTE — Procedures (Unsigned)
CAROTID DUPLEX EXAM  INDICATION:  Followup carotid stenosis.  HISTORY: Diabetes:  No. Cardiac:  No. Hypertension:  No. Smoking:  Yes. Previous Surgery:  Left carotid endarterectomy on 11/11/2010. CV History:  Currently experiencing vision disturbances and headaches. Amaurosis Fugax No, Paresthesias Yes, Hemiparesis No                                      RIGHT             LEFT Brachial systolic pressure:         138               136 Brachial Doppler waveforms:         WNL               WNL Vertebral direction of flow:        Antegrade         Antegrade DUPLEX VELOCITIES (cm/sec) CCA peak systolic                   67                90 ECA peak systolic                   128               74 ICA peak systolic                   225               39 - P/124 - M ICA end diastolic                   77                19 - P/57 - M PLAQUE MORPHOLOGY:                  Calcified         Heterogeneous PLAQUE AMOUNT:                      Moderate to severe                  Moderate PLAQUE LOCATION:                    CCA, ICA          ICA mid  IMPRESSION: 1. Right internal carotid artery stenosis in the 60%-79% range. 2. Left internal carotid artery is patent with history of     endarterectomy, with elevated velocities in the mid segment     suggestive of stenosis in the 40%-59% range. 3. Patent bilateral external carotid arteries. 4. Patent antegrade bilateral vertebral arteries. 5. Unchanged on the right since study on 10/06/2010.  ___________________________________________ Di Kindle. Edilia Bo, M.D.  SH/MEDQ  D:  06/03/2011  T:  06/03/2011  Job:  [id number]

## 2011-07-18 ENCOUNTER — Other Ambulatory Visit: Payer: Self-pay | Admitting: Neurology

## 2011-07-18 DIAGNOSIS — F17200 Nicotine dependence, unspecified, uncomplicated: Secondary | ICD-10-CM

## 2011-07-18 DIAGNOSIS — I779 Disorder of arteries and arterioles, unspecified: Secondary | ICD-10-CM

## 2011-07-18 DIAGNOSIS — R569 Unspecified convulsions: Secondary | ICD-10-CM

## 2011-07-18 DIAGNOSIS — R4701 Aphasia: Secondary | ICD-10-CM

## 2011-07-23 ENCOUNTER — Other Ambulatory Visit: Payer: Medicare Other

## 2011-08-01 ENCOUNTER — Ambulatory Visit
Admission: RE | Admit: 2011-08-01 | Discharge: 2011-08-01 | Disposition: A | Discharge status: Home / Self Care | Payer: Medicare Other | Source: Ambulatory Visit | Attending: Neurology | Admitting: Neurology

## 2011-08-01 ENCOUNTER — Other Ambulatory Visit: Payer: Self-pay | Admitting: Neurology

## 2011-08-01 DIAGNOSIS — R569 Unspecified convulsions: Secondary | ICD-10-CM

## 2011-08-01 DIAGNOSIS — Z77018 Contact with and (suspected) exposure to other hazardous metals: Secondary | ICD-10-CM

## 2011-08-01 DIAGNOSIS — I779 Disorder of arteries and arterioles, unspecified: Secondary | ICD-10-CM

## 2011-08-01 DIAGNOSIS — F17200 Nicotine dependence, unspecified, uncomplicated: Secondary | ICD-10-CM

## 2011-08-01 DIAGNOSIS — R4701 Aphasia: Secondary | ICD-10-CM

## 2011-09-06 ENCOUNTER — Encounter: Payer: Self-pay | Admitting: Vascular Surgery

## 2011-09-07 ENCOUNTER — Ambulatory Visit (INDEPENDENT_AMBULATORY_CARE_PROVIDER_SITE_OTHER): Payer: Medicare Other | Admitting: Vascular Surgery

## 2011-09-07 ENCOUNTER — Encounter: Payer: Self-pay | Admitting: Vascular Surgery

## 2011-09-07 ENCOUNTER — Other Ambulatory Visit (INDEPENDENT_AMBULATORY_CARE_PROVIDER_SITE_OTHER): Payer: Medicare Other

## 2011-09-07 VITALS — BP 110/70 | HR 68 | Resp 12 | Ht 62.0 in | Wt 118.0 lb

## 2011-09-07 DIAGNOSIS — Z48812 Encounter for surgical aftercare following surgery on the circulatory system: Secondary | ICD-10-CM

## 2011-09-07 DIAGNOSIS — I6529 Occlusion and stenosis of unspecified carotid artery: Secondary | ICD-10-CM

## 2011-09-07 DIAGNOSIS — Z9889 Other specified postprocedural states: Secondary | ICD-10-CM

## 2011-09-07 NOTE — Progress Notes (Signed)
[facility] [place]  Patient name: [name] MRN: [id number] DOB: [birth year] Sex: female  CC: followup of carotid disease  HPI: [name] is a 70 y.o. female who underwent a left carotid endarterectomy for symptomatic left carotid stenosis on 11/11/2010. She had duplex scan on 06/03/2011 which suggested some mild recurrent disease in the mid segment of her carotid endarterectomy site and a 40-59% range. In addition she had a 60-79% right carotid stenosis. Comes in for a followup duplex scan.  She's had no focal weakness or paresthesias. She did undergo an MRI after her last visit which showed no evidence of stroke. She continues to have some chronic headache. There are no aggravating or alleviating factors associated with this.  Past Medical History  Diagnosis Date  . COPD (chronic obstructive pulmonary disease)   . Reflux   . Shortness of breath 03/11/2010    while laying still  . Hyperlipidemia   . Osteoporosis   . Arthritis   . Bronchitis   . Anxiety   . Pure hypercholesterolemia   . Hypersomnia with sleep apnea, unspecified   . Dysphagia, unspecified   . Chronic airway obstruction, not elsewhere classified   . Generalized convulsive epilepsy without mention of intractable epilepsy     History reviewed. No pertinent family history.  SOCIAL HISTORY: History  Substance Use Topics  . 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 Smokeless tobacco: Not on file  . Alcohol Use: No    Allergies  Allergen Reactions  . Codeine     Current Outpatient Prescriptions  Medication Sig Dispense Refill  . Albuterol (PROVENTIL IN) Inhale into the lungs as needed.        Marland Kitchen alendronate (FOSAMAX) 70 MG tablet Take 70 mg by mouth every 7 (seven) days. Take with a full glass of water on an empty stomach.       Marland Kitchen aspirin 81 MG tablet Take 81 mg by mouth 2 (two) times daily.        . chlorhexidine  (PERIDEX) 0.12 % solution       . esomeprazole (NEXIUM) 40 MG capsule Take 40 mg by mouth daily as needed.        . Fluticasone-Salmeterol (ADVAIR HFA IN) Inhale into the lungs.        . L-ARGININE PO Take by mouth daily.        Marland Kitchen Phenytoin (DILANTIN PO) Take 100 mg by mouth 3 (three) times daily. 2 cap       . predniSONE (DELTASONE) 10 MG tablet Take 10 mg by mouth daily as needed.        . vitamin E 800 UNIT capsule Take 800 Units by mouth daily.          REVIEW OF SYSTEMS: Arly.Keller ] denotes positive finding; [  ] denotes negative finding CARDIOVASCULAR:  Arly.Keller ] chest pain stable  [ ]  chest pressure   [ ]  palpitations   [ ]  orthopnea   [ ]  dyspnea on exertion   [ ]  claudication   [ ]  rest pain   [ ]  DVT   [ ]  phlebitis PULMONARY:   [ ]  productive cough   [ ]  asthma   [ ]  wheezing NEUROLOGIC:   [ ]  weakness  [ ]  paresthesias  [ ]  aphasia  [ ]  amaurosis  [ ]  dizziness Arly.Keller ] headaches chronic HEMATOLOGIC:   [ ]  bleeding problems   [ ]   clotting disorders MUSCULOSKELETAL:  [ ]  joint pain   [ ]  joint swelling [ ]  leg swelling GASTROINTESTINAL: [ ]   blood in stool  [ ]   hematemesis GENITOURINARY:  [ ]   dysuria  [ ]   hematuria PSYCHIATRIC:  [ ]  history of major depression INTEGUMENTARY:  [ ]  rashes  [ ]  ulcers CONSTITUTIONAL:  [ ]  fever   [ ]  chills  PHYSICAL EXAM: Filed Vitals:   09/07/11 1109 09/07/11 1110  BP: 114/78 110/70  Pulse: 69 68  Resp: 16 12  Height: 5\' 2"  (1.575 m)   Weight: 118 lb (53.524 kg)   SpO2: 98%    Body mass index is 21.58 kg/(m^2). GENERAL: The patient is a well-nourished female, in no acute distress. The vital signs are documented above. CARDIOVASCULAR: There is a regular rate and rhythm without significant murmur appreciated. I do not detect any carotid bruits. PULMONARY: There is good air exchange bilaterally without wheezing or rales. ABDOMEN: Soft and non-tender with normal pitched bowel sounds.  MUSCULOSKELETAL: There are no major deformities or  cyanosis. NEUROLOGIC: No focal weakness or paresthesias are detected. SKIN: There are no ulcers or rashes noted. PSYCHIATRIC: The patient has a normal affect.  DATA:  Independently interpreted her carotid duplex scan today which shows that the mild recurrent stenosis on the left has resolved.she has a 60-79% right carotid stenosis although the velocities are slightly improved compared to the study 3 months ago.  MEDICAL ISSUES: Reassured her that her carotid endarterectomy site on the left is widely patent and that her stenosis on the right has actually improved slightly. Carlos we'll need to follow this closely. I've ordered a followup carotid duplex scan in 6 months and I'll see her back at that time. She knows to call sooner she has problems. In addition we've discussed the importance of tobacco cessation and I've offered to refer her to the tobacco cessation clinic at [place], however she states that she can not to travel to [facility] for this.  Chudney Scheffler S [facility] [place] Office: [phone]

## 2011-09-14 NOTE — Procedures (Unsigned)
CAROTID DUPLEX EXAM  INDICATION:  Carotid artery stenosis.  HISTORY: Diabetes:  No. Cardiac:  Tachycardia with subsequent ablation. Hypertension:  No. Smoking:  Yes. Previous Surgery:  Left carotid endarterectomy with DPA, on 11/11/10. CV History: Amaurosis Fugax No paresthesias No, Hemiparesis No.                                      RIGHT             LEFT Brachial systolic pressure:         120               128 Brachial Doppler waveforms:         Triphasic         Triphasic Vertebral direction of flow:        Antegrade         Antegrade DUPLEX VELOCITIES (cm/sec) CCA peak systolic                   79                99 ECA peak systolic                   112               116 ICA peak systolic                   162               38 - prox/87 - mid ICA end diastolic                   77                25 - prox/41 - mid PLAQUE MORPHOLOGY:                  Mixed, mostly calcified             Homogeneous PLAQUE AMOUNT:                      Moderate          Mild PLAQUE LOCATION:                    CCA, ICA          CCA  IMPRESSION: 1. 60% to 79% right internal carotid artery stenosis. 2. Patent left internal carotid artery, status post carotid     endarterectomy with velocities within normal limits. 3. Note: This study represents no significant change when compared     with the previous examination performed on 06/03/2011.  ___________________________________________ Di Kindle. Edilia Bo,  M.D.  CI/MEDQ  D:  09/07/2011  T:  09/07/2011  Job:  [id number]

## 2012-03-13 ENCOUNTER — Encounter: Payer: Self-pay | Admitting: Neurosurgery

## 2012-03-14 ENCOUNTER — Ambulatory Visit (INDEPENDENT_AMBULATORY_CARE_PROVIDER_SITE_OTHER): Payer: Medicare Other | Admitting: Vascular Surgery

## 2012-03-14 ENCOUNTER — Ambulatory Visit (INDEPENDENT_AMBULATORY_CARE_PROVIDER_SITE_OTHER): Payer: Medicare Other | Admitting: Neurosurgery

## 2012-03-14 ENCOUNTER — Encounter: Payer: Self-pay | Admitting: Neurosurgery

## 2012-03-14 VITALS — BP 128/77 | HR 67 | Resp 16 | Ht 62.0 in | Wt 122.5 lb

## 2012-03-14 DIAGNOSIS — I6529 Occlusion and stenosis of unspecified carotid artery: Secondary | ICD-10-CM

## 2012-03-14 DIAGNOSIS — Z9889 Other specified postprocedural states: Secondary | ICD-10-CM

## 2012-03-14 DIAGNOSIS — Z48812 Encounter for surgical aftercare following surgery on the circulatory system: Secondary | ICD-10-CM

## 2012-03-14 NOTE — Progress Notes (Signed)
Addended by: Sharee Pimple on: 03/14/2012 12:32 PM   Modules accepted: Orders

## 2012-03-14 NOTE — Progress Notes (Signed)
Carotid duplex performed @ [facility] on 03/14/2012

## 2012-03-14 NOTE — Progress Notes (Addendum)
[facility] [place] HISTORY AND PHYSICAL   CC: Six-month carotid duplex for surveillance of known stenosis Referring Physician: Edilia Bo  History of Present Illness: 71 year old female patient of [name] followed for known carotid stenosis on the right with a history of a left CEA in January 2012. Patient reports no signs or symptoms of CVA, TIA, amaurosis fugax or word finding difficulty. Patient does state she has some visual disturbance and has been seen by an ophthalmologist 2 times this month, she's been diagnosed with cataracts as well as glaucoma. Patient also has complaints of arthritis in her upper extremities which is worsening but no new vascular problems.  Past Medical History  Diagnosis Date  . COPD (chronic obstructive pulmonary disease)   . Reflux   . Shortness of breath 03/11/2010    while laying still  . Hyperlipidemia   . Osteoporosis   . Arthritis   . Bronchitis   . Anxiety   . Pure hypercholesterolemia   . Hypersomnia with sleep apnea, unspecified   . Dysphagia, unspecified   . Chronic airway obstruction, not elsewhere classified   . Generalized convulsive epilepsy without mention of intractable epilepsy     ROS: [x]  Positive   [ ]  Denies    General: [ ]  Weight loss, [ ]  Fever, [ ]  chills Neurologic: [ ]  Dizziness, [ ]  Blackouts, [ ]  Seizure [ ]  Stroke, [ ]  "Mini stroke", [ ]  Slurred speech, [x ] Temporary blindness; [x ] weakness in arms or legs, [ ]  Hoarseness Cardiac: [x ] Chest pain/pressure, [x ] Shortness of breath at rest [x ] Shortness of breath with exertion, [ ]  Atrial fibrillation or irregular heartbeat Vascular: [ ]  Pain in legs with walking, [x ] Pain in legs at rest, [ ]  Pain in legs at night,  [ ]  Non-healing ulcer, [x ] Blood clot in vein/DVT,  history of DVT Pulmonary: [ ]  Home oxygen, [ x] Productive cough, [ ]  Coughing up blood, [x ] Asthma,  [x ] Wheezing Musculoskeletal:  [ ]  Arthritis, [ ]  Low back pain, [ ]  Joint  pain Hematologic: [ ]  Easy Bruising, [ ]  Anemia; [ ]  Hepatitis Gastrointestinal: [ ]  Blood in stool, [ ]  Gastroesophageal Reflux/heartburn, [ ]  Trouble swallowing Urinary: [ ]  chronic Kidney disease, [ ]  on HD - [ ]  MWF or [ ]  TTHS, [x ] Burning with urination, [ ]  Difficulty urinating Skin: [ ]  Rashes, [ ]  Wounds Psychological: [ ]  Anxiety, [ ]  Depression   Social History History  Substance Use Topics  . 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 Smokeless tobacco: Not on file  . Alcohol Use: No    Family History History reviewed. No pertinent family history.  Allergies  Allergen Reactions  . Codeine     Current Outpatient Prescriptions  Medication Sig Dispense Refill  . Albuterol (PROVENTIL IN) Inhale into the lungs as needed.        Marland Kitchen alendronate (FOSAMAX) 70 MG tablet Take 70 mg by mouth every 7 (seven) days. Take with a full glass of water on an empty stomach.       Marland Kitchen aspirin 81 MG tablet Take 81 mg by mouth 2 (two) times daily.        Marland Kitchen esomeprazole (NEXIUM) 40 MG capsule Take 40 mg by mouth daily as needed.        . Fluticasone-Salmeterol (ADVAIR HFA IN) Inhale into the lungs.        Marland Kitchen  Phenytoin (DILANTIN PO) Take 100 mg by mouth 3 (three) times daily. 2 cap       . vitamin E 800 UNIT capsule Take 800 Units by mouth daily.        . chlorhexidine (PERIDEX) 0.12 % solution       . L-ARGININE PO Take by mouth daily.        . predniSONE (DELTASONE) 10 MG tablet Take 10 mg by mouth daily as needed.          Physical Examination  Filed Vitals:   03/14/12 1101  BP: 128/77  Pulse: 67  Resp: 16    Body mass index is 22.41 kg/(m^2).  General:  WDWN in NAD Gait: Normal HEENT: WNL Eyes: Pupils equal Pulmonary: normal non-labored breathing , without Rales, rhonchi,  wheezing Cardiac: RRR, without  Murmurs, rubs or gallops; Abdomen: soft, NT, no masses Skin: no rashes, ulcers noted  Vascular Exam Pulses: 2+ radial  pulses bilaterally Carotid bruits: Bilateral carotid pulses to auscultation no bruits are heard Extremities without ischemic changes, no Gangrene , no cellulitis; no open wounds;  Musculoskeletal: no muscle wasting or atrophy   Neurologic: A&O X 3; Appropriate Affect ; SENSATION: normal; MOTOR FUNCTION:  moving all extremities equally. Speech is fluent/normal  Non-Invasive Vascular Imaging CAROTID DUPLEX 03/14/2012  Right ICA 60 - 79 % stenosis Left ICA 40 - 59 % stenosis   ASSESSMENT/PLAN: Asymptomatic carotid stenosis right greater than left, history of left CEA in January 2012. Patient knows signs and symptoms of CVA and noticed report to the nearest emergency room should this occur. Otherwise, we will bring her back in 6 months for repeat carotid duplex, she is in agreement with this, her questions were encouraged and answered.  Lauree Chandler ANP   Clinic MD: Edilia Bo

## 2012-03-21 NOTE — Procedures (Unsigned)
CAROTID DUPLEX EXAM  INDICATION:  Carotid stenosis.  HISTORY: Diabetes:  No. Cardiac:  No. Hypertension:  No. Smoking:  Currently. Previous Surgery:  Left carotid endarterectomy on 11/11/2010. CV History:  Currently experiencing bilateral numbness and tingling of the upper extremities and vision changes. Amaurosis Fugax Yes, Paresthesias Yes, Hemiparesis No                                      RIGHT             LEFT Brachial systolic pressure:         128               132 Brachial Doppler waveforms:         WNL               WNL Vertebral direction of flow:        Antegrade         Antegrade DUPLEX VELOCITIES (cm/sec) CCA peak systolic                   69                84 ECA peak systolic                   99                100 ICA peak systolic                   229               42-P/102-M ICA end diastolic                   70                23-P/45-M PLAQUE MORPHOLOGY:                  Calcified         Not visualized PLAQUE AMOUNT:                      Moderate to severe                  Not visualized PLAQUE LOCATION:                    CCA/ICA           Not visualized  IMPRESSION: 1. Right internal carotid artery stenosis present in the 60-79% range. 2. Bilateral external carotid arteries appear patent. 3. Left internal carotid artery is patent with history of     endarterectomy, no hyperplasia identified, with mild hemodynamic     change at mid segment with mild tortuosity noted. 4. Bilateral vertebral arteries are patent and antegrade. 5. Essentially unchanged since previous study on 09/07/2011.  ___________________________________________ Di Kindle. Edilia Bo, M.D.  SH/MEDQ  D:  03/14/2012  T:  03/14/2012  Job:  [id number]

## 2012-09-17 ENCOUNTER — Encounter: Payer: Self-pay | Admitting: Neurosurgery

## 2012-09-18 ENCOUNTER — Ambulatory Visit: Payer: Medicare Other | Admitting: Neurosurgery

## 2012-09-18 ENCOUNTER — Other Ambulatory Visit: Payer: Medicare Other

## 2012-10-03 ENCOUNTER — Encounter: Payer: Self-pay | Admitting: Neurosurgery

## 2012-10-03 ENCOUNTER — Encounter: Payer: Self-pay | Admitting: Vascular Surgery

## 2012-10-03 ENCOUNTER — Ambulatory Visit (INDEPENDENT_AMBULATORY_CARE_PROVIDER_SITE_OTHER): Payer: Medicare Other | Admitting: Neurosurgery

## 2012-10-03 ENCOUNTER — Other Ambulatory Visit (INDEPENDENT_AMBULATORY_CARE_PROVIDER_SITE_OTHER): Payer: Medicare Other | Admitting: *Deleted

## 2012-10-03 VITALS — BP 105/68 | HR 64 | Resp 16 | Ht 62.0 in | Wt 119.0 lb

## 2012-10-03 DIAGNOSIS — Z48812 Encounter for surgical aftercare following surgery on the circulatory system: Secondary | ICD-10-CM

## 2012-10-03 DIAGNOSIS — I6529 Occlusion and stenosis of unspecified carotid artery: Secondary | ICD-10-CM

## 2012-10-03 NOTE — Progress Notes (Signed)
[facility] [place] Carotid Office Note  CC: Carotid surveillance Referring Physician: Edilia Wallace  History of Present Illness: 71 year old female patient of Dr. Edilia Wallace status post left CEA in January 2012. The patient denies any signs or symptoms of CVA, TIA, amaurosis fugax or any neural deficit. The patient denies any new medical diagnoses or recent surgery.  Past Medical History  Diagnosis Date  . COPD (chronic obstructive pulmonary disease)   . Reflux   . Shortness of breath 03/11/2010    while laying still  . Hyperlipidemia   . Osteoporosis   . Arthritis   . Bronchitis   . Anxiety   . Pure hypercholesterolemia   . Hypersomnia with sleep apnea, unspecified   . Dysphagia, unspecified   . Chronic airway obstruction, not elsewhere classified   . Generalized convulsive epilepsy without mention of intractable epilepsy   . Carotid artery occlusion     ROS: [x]  Positive   [ ]  Denies    General: [ ]  Weight loss, [ ]  Fever, [ ]  chills Neurologic: [ ]  Dizziness, [ ]  Blackouts, [ ]  Seizure [ ]  Stroke, [ ]  "Mini stroke", [ ]  Slurred speech, [ ]  Temporary blindness; [ ]  weakness in arms or legs, [ ]  Hoarseness Cardiac: [ ]  Chest pain/pressure, [ ]  Shortness of breath at rest [ ]  Shortness of breath with exertion, [ ]  Atrial fibrillation or irregular heartbeat Vascular: [ ]  Pain in legs with walking, [ ]  Pain in legs at rest, [ ]  Pain in legs at night,  [ ]  Non-healing ulcer, [ ]  Blood clot in vein/DVT,   Pulmonary: [ ]  Home oxygen, [ ]  Productive cough, [ ]  Coughing up blood, [ ]  Asthma,  [ ]  Wheezing Musculoskeletal:  [ ]  Arthritis, [ ]  Low back pain, [ ]  Joint pain Hematologic: [ ]  Easy Bruising, [ ]  Anemia; [ ]  Hepatitis Gastrointestinal: [ ]  Blood in stool, [ ]  Gastroesophageal Reflux/heartburn, [ ]  Trouble swallowing Urinary: [ ]  chronic Kidney disease, [ ]  on HD - [ ]  MWF or [ ]  TTHS, [ ]  Burning with urination, [ ]  Difficulty urinating Skin: [ ]  Rashes, [ ]   Wounds Psychological: [ ]  Anxiety, [ ]  Depression   Social History History  Substance Use Topics  . Smoking status: Current Every Day Smoker -- 2.0 packs/day for 84 years    Types: Cigarettes  . Smokeless tobacco: Never Used  . Alcohol Use: No    Family History Family History  Problem Relation Age of Onset  . Heart disease Mother     CHF  . Heart disease Father     Allergies  Allergen Reactions  . Codeine Shortness Of Breath    Current Outpatient Prescriptions  Medication Sig Dispense Refill  . Albuterol (PROVENTIL IN) Inhale into the lungs as needed.        Marland Kitchen aspirin 81 MG tablet Take 81 mg by mouth 2 (two) times daily.        . bethanechol (URECHOLINE) 25 MG tablet       . calcium-vitamin D (OSCAL WITH D) 500-200 MG-UNIT per tablet Take 1,000 tablets by mouth daily.      . chlorhexidine (PERIDEX) 0.12 % solution       . esomeprazole (NEXIUM) 40 MG capsule Take 40 mg by mouth daily as needed.        Marland Kitchen HYDROcodone-acetaminophen (LORTAB) 10-500 MG per tablet       . Phenytoin (DILANTIN PO) Take 100 mg by mouth 3 (three) times  daily. 2 cap       . predniSONE (DELTASONE) 10 MG tablet Take 10 mg by mouth daily as needed.        . tiotropium (SPIRIVA) 18 MCG inhalation capsule Place 18 mcg into inhaler and inhale daily.      . vitamin E 800 UNIT capsule Take 800 Units by mouth daily.        Marland Kitchen alendronate (FOSAMAX) 70 MG tablet Take 70 mg by mouth every 7 (seven) days. Take with a full glass of water on an empty stomach.       . Fluticasone-Salmeterol (ADVAIR HFA IN) Inhale into the lungs.        . L-ARGININE PO Take by mouth daily.          Physical Examination  Filed Vitals:   10/03/12 1402  BP: 105/68  Pulse: 64  Resp:     Body mass index is 21.77 kg/(m^2).  General:  WDWN in NAD Gait: Normal HEENT: WNL Eyes: Pupils equal Pulmonary: normal non-labored breathing , without Rales, rhonchi,  wheezing Cardiac: RRR, without  Murmurs, rubs or gallops; Abdomen: soft,  NT, no masses Skin: no rashes, ulcers noted  Vascular Exam Pulses: 3+ radial pulses Carotid bruits: Carotid pulses to auscultation I do not hear any bruit Extremities without ischemic changes, no Gangrene , no cellulitis; no open wounds;  Musculoskeletal: no muscle wasting or atrophy   Neurologic: A&O X 3; Appropriate Affect ; SENSATION: normal; MOTOR FUNCTION:  moving all extremities equally. Speech is fluent/normal  Non-Invasive Vascular Imaging CAROTID DUPLEX 10/03/2012  Right ICA 60 - 79 % stenosis Left ICA 0 - 19% stenosis   ASSESSMENT/PLAN: Asymptomatic patient with stable carotid duplex. The patient will followup in 6 months with repeat carotid duplex, her questions were encouraged and answered, she is in agreement with this plan. The patient knows the signs and symptoms of CVA and knows to call 911 should this occur.  [name] ANP   Clinic MD: [name]

## 2012-10-03 NOTE — Addendum Note (Signed)
Addended by: Sharee Pimple on: 10/03/2012 04:07 PM   Modules accepted: Orders

## 2013-04-03 ENCOUNTER — Ambulatory Visit: Payer: Medicare Other | Admitting: Neurosurgery

## 2013-04-03 ENCOUNTER — Other Ambulatory Visit: Payer: Medicare Other

## 2013-04-10 ENCOUNTER — Other Ambulatory Visit: Payer: Self-pay

## 2013-04-10 ENCOUNTER — Other Ambulatory Visit (INDEPENDENT_AMBULATORY_CARE_PROVIDER_SITE_OTHER): Payer: Medicare Other | Admitting: *Deleted

## 2013-04-10 DIAGNOSIS — I6529 Occlusion and stenosis of unspecified carotid artery: Secondary | ICD-10-CM

## 2013-04-10 DIAGNOSIS — Z48812 Encounter for surgical aftercare following surgery on the circulatory system: Secondary | ICD-10-CM

## 2013-04-12 ENCOUNTER — Encounter: Payer: Self-pay | Admitting: Vascular Surgery

## 2013-12-06 ENCOUNTER — Other Ambulatory Visit: Payer: Self-pay | Admitting: Vascular Surgery

## 2013-12-06 DIAGNOSIS — I6529 Occlusion and stenosis of unspecified carotid artery: Secondary | ICD-10-CM

## 2013-12-06 DIAGNOSIS — Z48812 Encounter for surgical aftercare following surgery on the circulatory system: Secondary | ICD-10-CM

## 2014-02-11 ENCOUNTER — Telehealth: Payer: Self-pay

## 2014-02-11 NOTE — Telephone Encounter (Signed)
Phone call from pt. C/o an "achy feeling in (L) neck"  for approx. The past 2-4 weeks.  Also reported she has had a headache and an aching behind the left eye recenty.   Questioned about further sx's.  Reports she has had some difficulty with saying what she is thinking. Denies facial droop; states "I haven't noticed it."  Questioned about unilateral weakness; stated "I feel weak all over."  Also reported recent difficulty holding a pencil in either hand.  Voiced concern of not being checked in over a year, and asking to have July 1st appt. Moved-up.  Advised to notify her PCP about the above sx's of headache and aching behind the left eye.  Advised will contact her with new appt. Per her request.  Encouraged to seek medical attention for any stroke-like symptoms; ie: loss of vision in eye, unilateral weakness, dyspasia, confusion, or any other concerns.  Verb. Understanding.

## 2014-02-12 NOTE — Telephone Encounter (Signed)
Left detailed message for pt with new appointment time of 02/26/14 at 2:00 for carotid ultrasound and at 3:00 with Dr Scot Dock. dpm

## 2014-02-25 ENCOUNTER — Encounter: Payer: Self-pay | Admitting: Vascular Surgery

## 2014-02-26 ENCOUNTER — Ambulatory Visit (INDEPENDENT_AMBULATORY_CARE_PROVIDER_SITE_OTHER): Payer: Medicare HMO | Admitting: Vascular Surgery

## 2014-02-26 ENCOUNTER — Ambulatory Visit (HOSPITAL_COMMUNITY)
Admission: RE | Admit: 2014-02-26 | Discharge: 2014-02-26 | Disposition: A | Discharge status: Home / Self Care | Payer: Medicare HMO | Source: Ambulatory Visit | Attending: Vascular Surgery | Admitting: Vascular Surgery

## 2014-02-26 ENCOUNTER — Encounter: Payer: Self-pay | Admitting: Vascular Surgery

## 2014-02-26 ENCOUNTER — Other Ambulatory Visit: Payer: Self-pay | Admitting: Vascular Surgery

## 2014-02-26 VITALS — BP 122/80 | HR 77 | Ht 62.0 in | Wt 107.1 lb

## 2014-02-26 DIAGNOSIS — Z48812 Encounter for surgical aftercare following surgery on the circulatory system: Secondary | ICD-10-CM

## 2014-02-26 DIAGNOSIS — R51 Headache: Secondary | ICD-10-CM

## 2014-02-26 DIAGNOSIS — I6529 Occlusion and stenosis of unspecified carotid artery: Secondary | ICD-10-CM

## 2014-02-26 DIAGNOSIS — G8929 Other chronic pain: Secondary | ICD-10-CM

## 2014-02-26 NOTE — Addendum Note (Signed)
Addended by: Mena Goes on: 02/26/2014 03:23 PM   Modules accepted: Orders

## 2014-02-26 NOTE — Progress Notes (Signed)
Vascular and Vein Specialist of [facility]  Patient name: [name] MRN: [id number] DOB: Dec 02, 1940 Sex: female  REASON FOR VISIT: Follow up of carotid disease.  HPI: [name] is a 73 y.o. female who underwent a left carotid endarterectomy in January of 2012. this was for asymptomatic left carotid stenosis. She was last seen in our office by the nurse practitioner on 10/03/2012.  At that time, the left carotid endarterectomy site was widely patent. There was a 60-79% right carotid stenosis. The patient was set up for a 6 month follow up visit.   Since I saw her last she denies any history of stroke, TIAs, expressive or receptive aphasia, or amaurosis fugax. She does complain of simply not feeling well but has no specific complaints. She apparently has had problems with arrhythmias in the past and has undergone a previous ablation procedure. She is followed by Dr. Bettina Gavia.  She had been on a statin in the past but did not tolerate this. She states that she does have a prescription for Crestor but has not taken his yet. She is on aspirin. Does continue to smoke greater than a pack per day of cigarettes.  Past Medical History  Diagnosis Date  . COPD (chronic obstructive pulmonary disease)   . Reflux   . Shortness of breath 03/11/2010    while laying still  . Hyperlipidemia   . Osteoporosis   . Arthritis   . Bronchitis   . Anxiety   . Pure hypercholesterolemia   . Hypersomnia with sleep apnea, unspecified   . Dysphagia, unspecified(787.20)   . Chronic airway obstruction, not elsewhere classified   . Generalized convulsive epilepsy without mention of intractable epilepsy   . Carotid artery occlusion    Family History  Problem Relation Age of Onset  . Heart disease Mother     CHF  . Hyperlipidemia Mother   . Hypertension Mother   . Heart attack Mother   . Deep vein thrombosis Mother   . AAA (abdominal aortic aneurysm) Mother   . Heart disease Father   . Hyperlipidemia  Father   . Hypertension Father   . Heart attack Father   . Cancer Sister   . Hyperlipidemia Sister   . Hypertension Sister   . Cancer Brother   . Hyperlipidemia Brother    SOCIAL HISTORY: History  Substance Use Topics  . Smoking status: Current Every Day Smoker -- 2.00 packs/day for 84 years    Types: Cigarettes  . Smokeless tobacco: Never Used  . Alcohol Use: No   Allergies  Allergen Reactions  . Codeine Shortness Of Breath   Current Outpatient Prescriptions  Medication Sig Dispense Refill  . Albuterol (PROVENTIL IN) Inhale into the lungs as needed.        Marland Kitchen alendronate (FOSAMAX) 70 MG tablet Take 70 mg by mouth every 7 (seven) days. Take with a full glass of water on an empty stomach.       Marland Kitchen aspirin 81 MG tablet Take 81 mg by mouth 2 (two) times daily.        . calcium-vitamin D (OSCAL WITH D) 500-200 MG-UNIT per tablet Take 1,000 tablets by mouth daily.      Marland Kitchen esomeprazole (NEXIUM) 40 MG capsule Take 40 mg by mouth daily as needed.        Marland Kitchen HYDROcodone-acetaminophen (LORTAB) 10-500 MG per tablet       . Phenytoin (DILANTIN PO) Take 100 mg by mouth 3 (three) times daily. 2 cap       .  predniSONE (DELTASONE) 10 MG tablet Take 10 mg by mouth daily as needed.        . tiotropium (SPIRIVA) 18 MCG inhalation capsule Place 18 mcg into inhaler and inhale daily.      . vitamin E 800 UNIT capsule Take 800 Units by mouth daily.        . bethanechol (URECHOLINE) 25 MG tablet       . chlorhexidine (PERIDEX) 0.12 % solution       . Fluticasone-Salmeterol (ADVAIR HFA IN) Inhale into the lungs.        . L-ARGININE PO Take by mouth daily.         No current facility-administered medications for this visit.   REVIEW OF SYSTEMS: Valu.Nieves ] denotes positive finding; [  ] denotes negative finding  CARDIOVASCULAR:  [ ]  chest pain   [ ]  chest pressure   [ ]  palpitations   Valu.Nieves ] orthopnea   [ ]  dyspnea on exertion   [ ]  claudication   [ ]  rest pain   Valu.Nieves ] DVT   [ ]  phlebitis PULMONARY:   Valu.Nieves ]  productive cough   Valu.Nieves ] asthma   [X]  wheezing NEUROLOGIC:   Valu.Nieves ] weakness- generalized  [ ]  paresthesias  [ ]  aphasia  [ ]  amaurosis  [ ]  dizziness HEMATOLOGIC:   [ ]  bleeding problems   [ ]  clotting disorders MUSCULOSKELETAL:  [ ]  joint pain   [ ]  joint swelling [ ]  leg swelling GASTROINTESTINAL: [ ]   blood in stool  [ ]   hematemesis GENITOURINARY:  Valu.Nieves ]  dysuria  [ ]   hematuria PSYCHIATRIC:  [ ]  history of major depression INTEGUMENTARY:  [ ]  rashes  [ ]  ulcers CONSTITUTIONAL:  [ ]  fever   [ ]  chills  PHYSICAL EXAM: Filed Vitals:   02/26/14 1430 02/26/14 1433  BP: 116/65 122/80  Pulse: 77   Height: 5\' 2"  (1.575 m)   Weight: 107 lb 1.6 oz (48.58 kg)   SpO2: 97%    Body mass index is 19.58 kg/(m^2). GENERAL: The patient is a well-nourished female, in no acute distress. The vital signs are documented above. CARDIOVASCULAR: There is a regular rate and rhythm. I do not detect carotid bruits. PULMONARY: There is good air exchange bilaterally without wheezing or rales. ABDOMEN: Soft and non-tender with normal pitched bowel sounds.  MUSCULOSKELETAL: There are no major deformities or cyanosis. NEUROLOGIC: No focal weakness or paresthesias are detected. SKIN: There are no ulcers or rashes noted. PSYCHIATRIC: The patient has a normal affect.  DATA:  I have independently interpreted her carotid duplex scan which shows a 60-79% right carotid stenosis. The left carotid endarterectomy site is widely patent. Both vertebral arteries are patent with normally directed flow.  I have compared this study to the most recent study which was done 04/10/2013,  And there has been no significant change.  MEDICAL ISSUES:  Occlusion and stenosis of carotid artery without mention of cerebral infarction Her left carotid endarterectomy site is widely patent. She has an asymptomatic 60-79% right carotid stenosis. She is on aspirin but is not on a statin. I've explained to her that there is strong evidence in  the vascular literature that patients with peripheral vascular disease have significantly lower mortality if they are on both aspirin and a statin. She states that she has a prescription for Crestor but has not tried this. I have encouraged her to try to Crestor if she has problems with this to discuss this  with her primary care physician. I've ordered a fall carotid duplex scan in 6 months. She understands that if the right carotid stenosis progresses to greater than 80%, or she developed right hemispheric symptoms, we would need to consider right carotid endarterectomy. I'll see her back in 6 months. She knows to call sooner if she has problems.    Return in about 6 months (around 08/29/2014).  Angelia Mould [facility] [place] [facility]per: [phone]

## 2014-02-26 NOTE — Assessment & Plan Note (Signed)
Her left carotid endarterectomy site is widely patent. She has an asymptomatic 60-79% right carotid stenosis. She is on aspirin but is not on a statin. I've explained to her that there is strong evidence in the vascular literature that patients with peripheral vascular disease have significantly lower mortality if they are on both aspirin and a statin. She states that she has a prescription for Crestor but has not tried this. I have encouraged her to try to Crestor if she has problems with this to discuss this with her primary care physician. I've ordered a fall carotid duplex scan in 6 months. She understands that if the right carotid stenosis progresses to greater than 80%, or she developed right hemispheric symptoms, we would need to consider right carotid endarterectomy. I'll see her back in 6 months. She knows to call sooner if she has problems.

## 2014-04-16 ENCOUNTER — Ambulatory Visit: Payer: Medicare Other | Admitting: Vascular Surgery

## 2014-04-16 ENCOUNTER — Other Ambulatory Visit (HOSPITAL_COMMUNITY): Payer: Medicare Other

## 2014-04-23 ENCOUNTER — Other Ambulatory Visit (HOSPITAL_COMMUNITY): Payer: Commercial Managed Care - HMO

## 2014-04-23 ENCOUNTER — Ambulatory Visit: Payer: Self-pay | Admitting: Vascular Surgery

## 2014-09-03 ENCOUNTER — Other Ambulatory Visit (HOSPITAL_COMMUNITY): Payer: Commercial Managed Care - HMO

## 2014-09-03 ENCOUNTER — Ambulatory Visit: Payer: Commercial Managed Care - HMO | Admitting: Vascular Surgery

## 2014-09-09 ENCOUNTER — Encounter: Payer: Self-pay | Admitting: Vascular Surgery

## 2014-09-10 ENCOUNTER — Other Ambulatory Visit (HOSPITAL_COMMUNITY): Payer: Commercial Managed Care - HMO

## 2014-09-10 ENCOUNTER — Ambulatory Visit: Payer: Commercial Managed Care - HMO | Admitting: Vascular Surgery

## 2014-10-08 ENCOUNTER — Ambulatory Visit: Payer: Commercial Managed Care - HMO | Admitting: Vascular Surgery

## 2014-10-08 ENCOUNTER — Other Ambulatory Visit (HOSPITAL_COMMUNITY): Payer: Commercial Managed Care - HMO

## 2014-11-25 ENCOUNTER — Encounter: Payer: Self-pay | Admitting: Vascular Surgery

## 2014-11-26 ENCOUNTER — Encounter: Payer: Self-pay | Admitting: Vascular Surgery

## 2014-11-26 ENCOUNTER — Other Ambulatory Visit: Payer: Self-pay | Admitting: Vascular Surgery

## 2014-11-26 ENCOUNTER — Ambulatory Visit (HOSPITAL_COMMUNITY)
Admission: RE | Admit: 2014-11-26 | Discharge: 2014-11-26 | Disposition: A | Discharge status: Home / Self Care | Payer: Commercial Managed Care - HMO | Source: Ambulatory Visit | Attending: Vascular Surgery | Admitting: Vascular Surgery

## 2014-11-26 ENCOUNTER — Ambulatory Visit (INDEPENDENT_AMBULATORY_CARE_PROVIDER_SITE_OTHER): Payer: Commercial Managed Care - HMO | Admitting: Vascular Surgery

## 2014-11-26 VITALS — BP 129/84 | HR 62 | Ht 62.0 in | Wt 106.1 lb

## 2014-11-26 DIAGNOSIS — I6523 Occlusion and stenosis of bilateral carotid arteries: Secondary | ICD-10-CM | POA: Insufficient documentation

## 2014-11-26 DIAGNOSIS — I739 Peripheral vascular disease, unspecified: Secondary | ICD-10-CM | POA: Diagnosis not present

## 2014-11-26 DIAGNOSIS — Z48812 Encounter for surgical aftercare following surgery on the circulatory system: Secondary | ICD-10-CM

## 2014-11-26 NOTE — Addendum Note (Signed)
Addended by: Mena Goes on: 11/26/2014 02:01 PM   Modules accepted: Orders

## 2014-11-26 NOTE — Progress Notes (Signed)
Vascular and Vein Specialist of [facility]  Patient name: [name] MRN: [id number] DOB: 07-08-1941 Sex: female  REASON FOR VISIT: Follow up of carotid disease  HPI: [name] is a 74 y.o. female who is status post left carotid endarterectomy in 2012. I've been following a 60-79% right carotid stenosis. She comes in for a 6 month follow up visit. Since I saw her last, she denies any history of focal weakness or paresthesias. She denies any amaurosis fugax. She is on aspirin. She does not tolerate statins.  Her only other complaint today is pain in her legs when she is out in the cold. This occurs with simply standing. Her symptoms are not necessarily aggravated by activity and this does not sound like claudication.   Past Medical History  Diagnosis Date  . COPD (chronic obstructive pulmonary disease)   . Reflux   . Shortness of breath 03/11/2010    while laying still  . Hyperlipidemia   . Osteoporosis   . Arthritis   . Bronchitis   . Anxiety   . Pure hypercholesterolemia   . Hypersomnia with sleep apnea, unspecified   . Dysphagia, unspecified(787.20)   . Chronic airway obstruction, not elsewhere classified   . Generalized convulsive epilepsy without mention of intractable epilepsy   . Carotid artery occlusion    Family History  Problem Relation Age of Onset  . Heart disease Mother     CHF  . Hyperlipidemia Mother   . Hypertension Mother   . Heart attack Mother   . Deep vein thrombosis Mother   . AAA (abdominal aortic aneurysm) Mother   . Heart disease Father   . Hyperlipidemia Father   . Hypertension Father   . Heart attack Father   . Cancer Sister   . Hyperlipidemia Sister   . Hypertension Sister   . Cancer Brother   . Hyperlipidemia Brother    SOCIAL HISTORY: History  Substance Use Topics  . Smoking status: Current Every Day Smoker -- 2.00 packs/day for 30 years    Types: Cigarettes  . Smokeless tobacco: Never Used  . Alcohol Use: No   Allergies   Allergen Reactions  . Codeine Shortness Of Breath   Current Outpatient Prescriptions  Medication Sig Dispense Refill  . Albuterol (PROVENTIL IN) Inhale into the lungs as needed.      Marland Kitchen alendronate (FOSAMAX) 70 MG tablet Take 70 mg by mouth every 7 (seven) days. Take with a full glass of water on an empty stomach.     Marland Kitchen aspirin 81 MG tablet Take 81 mg by mouth 2 (two) times daily.      . bethanechol (URECHOLINE) 25 MG tablet     . calcium-vitamin D (OSCAL WITH D) 500-200 MG-UNIT per tablet Take 1,000 tablets by mouth daily.    . chlorhexidine (PERIDEX) 0.12 % solution     . esomeprazole (NEXIUM) 40 MG capsule Take 40 mg by mouth daily as needed.      Marland Kitchen HYDROcodone-acetaminophen (LORTAB) 10-500 MG per tablet     . L-ARGININE PO Take by mouth daily.      Marland Kitchen oxycodone (OXY-IR) 5 MG capsule Take 5 mg by mouth every 4 (four) hours as needed.    Marland Kitchen Phenytoin (DILANTIN PO) Take 100 mg by mouth 3 (three) times daily. 2 cap     . tiotropium (SPIRIVA) 18 MCG inhalation capsule Place 18 mcg into inhaler and inhale daily.    . Fluticasone-Salmeterol (ADVAIR HFA IN) Inhale into the lungs.      Marland Kitchen  predniSONE (DELTASONE) 10 MG tablet Take 10 mg by mouth daily as needed.      . vitamin E 800 UNIT capsule Take 800 Units by mouth daily.       No current facility-administered medications for this visit.   REVIEW OF SYSTEMS: Valu.Nieves ] denotes positive finding; [  ] denotes negative finding  CARDIOVASCULAR:  [ ]  chest pain   [ ]  chest pressure   [ ]  palpitations   Valu.Nieves ] orthopnea   Valu.Nieves ] dyspnea on exertion   [ ]  claudication   [ ]  rest pain   [ ]  DVT   [ ]  phlebitis PULMONARY:   Valu.Nieves ] productive cough   Valu.Nieves ] asthma   Valu.Nieves ] wheezing NEUROLOGIC:   [ ]  weakness  [ ]  paresthesias  [ ]  aphasia  [ ]  amaurosis  [ ]  dizziness HEMATOLOGIC:   [ ]  bleeding problems   [ ]  clotting disorders MUSCULOSKELETAL:  [ ]  joint pain   [ ]  joint swelling [ ]  leg swelling GASTROINTESTINAL: [ ]   blood in stool  [ ]    hematemesis GENITOURINARY:  [ ]   dysuria  [ ]   hematuria PSYCHIATRIC:  [ ]  history of major depression INTEGUMENTARY:  [ ]  rashes  [ ]  ulcers CONSTITUTIONAL:  [ ]  fever   [ ]  chills  PHYSICAL EXAM: Filed Vitals:   11/26/14 1116 11/26/14 1119  BP: 134/74 129/84  Pulse: 62   Height: 5\' 2"  (1.575 m)   Weight: 106 lb 1.6 oz (48.127 kg)   SpO2: 98%    Body mass index is 19.4 kg/(m^2). GENERAL: The patient is a well-nourished female, in no acute distress. The vital signs are documented above. CARDIOVASCULAR: There is a regular rate and rhythm. I do not detect carotid bruits. She has slightly diminished femoral pulses and slightly diminished dorsalis pedis pulses. PULMONARY: There is good air exchange bilaterally without wheezing or rales. ABDOMEN: Soft and non-tender with normal pitched bowel sounds.  MUSCULOSKELETAL: There are no major deformities or cyanosis. NEUROLOGIC: No focal weakness or paresthesias are detected. SKIN: There are no ulcers or rashes noted. PSYCHIATRIC: The patient has a normal affect.  DATA:  I have independently interpreted her carotid duplex scan today. She has no evidence of significant recurrent carotid stenosis on the left. On the right side she has a 60-79% stenosis. The highest velocities are in the proximal internal carotid artery with peak systolic velocity of 160 cm/s and an end diastolic velocity of 77 cm/s. These velocities have increased only slightly compared to 6 months ago.  MEDICAL ISSUES:  BILATERAL CAROTID DISEASE: the left carotid endarterectomy site is widely patent without evidence of recurrent stenosis. The 60-79% right carotid stenosis is stable. She understands we would not consider right carotid endarterectomy and was the stenosis progressed to greater than 80% or she develop new focal symptoms. He is on aspirin. She does not tolerate statins. She does continue to smoke but is trying to quit. We have discussed the importance of tobacco  cessation today. I will order a follow up carotid duplex scan in 6 months and I'll see her back at that time.  She was also complaining of leg pain today although she does have palpable pulses they're slightly diminished. Her pain occurs with standing and she does have significant back issues. However when she returns in 6 months we will obtain ABIs.   Return in about 6 months (around 05/27/2015).  Iceis Knab S [facility] of [place] [facility]:  [phone]    

## 2015-06-02 ENCOUNTER — Encounter: Payer: Self-pay | Admitting: Vascular Surgery

## 2015-06-03 ENCOUNTER — Encounter: Payer: Self-pay | Admitting: Vascular Surgery

## 2015-06-03 ENCOUNTER — Other Ambulatory Visit: Payer: Self-pay | Admitting: Vascular Surgery

## 2015-06-03 ENCOUNTER — Ambulatory Visit (INDEPENDENT_AMBULATORY_CARE_PROVIDER_SITE_OTHER)
Admission: RE | Admit: 2015-06-03 | Discharge: 2015-06-03 | Disposition: A | Discharge status: Home / Self Care | Payer: Commercial Managed Care - HMO | Source: Ambulatory Visit | Attending: Vascular Surgery | Admitting: Vascular Surgery

## 2015-06-03 ENCOUNTER — Ambulatory Visit (HOSPITAL_COMMUNITY)
Admission: RE | Admit: 2015-06-03 | Discharge: 2015-06-03 | Disposition: A | Discharge status: Home / Self Care | Payer: Commercial Managed Care - HMO | Source: Ambulatory Visit | Attending: Vascular Surgery | Admitting: Vascular Surgery

## 2015-06-03 ENCOUNTER — Ambulatory Visit (INDEPENDENT_AMBULATORY_CARE_PROVIDER_SITE_OTHER): Payer: Commercial Managed Care - HMO | Admitting: Vascular Surgery

## 2015-06-03 VITALS — BP 148/64 | HR 63 | Ht 62.0 in | Wt 110.0 lb

## 2015-06-03 DIAGNOSIS — Z48812 Encounter for surgical aftercare following surgery on the circulatory system: Secondary | ICD-10-CM

## 2015-06-03 DIAGNOSIS — R0989 Other specified symptoms and signs involving the circulatory and respiratory systems: Secondary | ICD-10-CM | POA: Insufficient documentation

## 2015-06-03 DIAGNOSIS — I6521 Occlusion and stenosis of right carotid artery: Secondary | ICD-10-CM | POA: Insufficient documentation

## 2015-06-03 DIAGNOSIS — M79604 Pain in right leg: Secondary | ICD-10-CM

## 2015-06-03 DIAGNOSIS — M79605 Pain in left leg: Secondary | ICD-10-CM

## 2015-06-03 DIAGNOSIS — I739 Peripheral vascular disease, unspecified: Secondary | ICD-10-CM

## 2015-06-03 DIAGNOSIS — Z9889 Other specified postprocedural states: Secondary | ICD-10-CM

## 2015-06-03 DIAGNOSIS — I6523 Occlusion and stenosis of bilateral carotid arteries: Secondary | ICD-10-CM | POA: Diagnosis not present

## 2015-06-03 DIAGNOSIS — F17200 Nicotine dependence, unspecified, uncomplicated: Secondary | ICD-10-CM

## 2015-06-03 DIAGNOSIS — Z720 Tobacco use: Secondary | ICD-10-CM

## 2015-06-03 NOTE — Patient Instructions (Addendum)
Stroke Prevention Some medical conditions and behaviors are associated with an increased chance of having a stroke. You may prevent a stroke by making healthy choices and managing medical conditions. HOW CAN I REDUCE MY RISK OF HAVING A STROKE?   Stay physically active. Get at least 30 minutes of activity on most or all days.  Do not smoke. It may also be helpful to avoid exposure to secondhand smoke.  Limit alcohol use. Moderate alcohol use is considered to be:  No more than 2 drinks per day for men.  No more than 1 drink per day for nonpregnant women.  Eat healthy foods. This involves:  Eating 5 or more servings of fruits and vegetables a day.  Making dietary changes that address high blood pressure (hypertension), high cholesterol, diabetes, or obesity.  Manage your cholesterol levels.  Making food choices that are high in fiber and low in saturated fat, trans fat, and cholesterol may control cholesterol levels.  Take any prescribed medicines to control cholesterol as directed by your health care provider.  Manage your diabetes.  Controlling your carbohydrate and sugar intake is recommended to manage diabetes.  Take any prescribed medicines to control diabetes as directed by your health care provider.  Control your hypertension.  Making food choices that are low in salt (sodium), saturated fat, trans fat, and cholesterol is recommended to manage hypertension.  Take any prescribed medicines to control hypertension as directed by your health care provider.  Maintain a healthy weight.  Reducing calorie intake and making food choices that are low in sodium, saturated fat, trans fat, and cholesterol are recommended to manage weight.  Stop drug abuse.  Avoid taking birth control pills.  Talk to your health care provider about the risks of taking birth control pills if you are over 35 years old, smoke, get migraines, or have ever had a blood clot.  Get evaluated for sleep  disorders (sleep apnea).  Talk to your health care provider about getting a sleep evaluation if you snore a lot or have excessive sleepiness.  Take medicines only as directed by your health care provider.  For some people, aspirin or blood thinners (anticoagulants) are helpful in reducing the risk of forming abnormal blood clots that can lead to stroke. If you have the irregular heart rhythm of atrial fibrillation, you should be on a blood thinner unless there is a good reason you cannot take them.  Understand all your medicine instructions.  Make sure that other conditions (such as anemia or atherosclerosis) are addressed. SEEK IMMEDIATE MEDICAL CARE IF:   You have sudden weakness or numbness of the face, arm, or leg, especially on one side of the body.  Your face or eyelid droops to one side.  You have sudden confusion.  You have trouble speaking (aphasia) or understanding.  You have sudden trouble seeing in one or both eyes.  You have sudden trouble walking.  You have dizziness.  You have a loss of balance or coordination.  You have a sudden, severe headache with no known cause.  You have new chest pain or an irregular heartbeat. Any of these symptoms may represent a serious problem that is an emergency. Do not wait to see if the symptoms will go away. Get medical help at once. Call your local emergency services (911 in [place] Do not drive yourself to the hospital. Document Released: 11/17/2004 Document Revised: 02/24/2014 Document Reviewed: 04/12/2013 [employer] Patient Information 2015 [employer]. This information is not intended to replace advice given   to you by your health care provider. Make sure you discuss any questions you have with your health care provider.   Smoking Cessation Quitting smoking is important to your health and has many advantages. However, it is not always easy to quit since nicotine is a very addictive drug. Oftentimes, people try 3 times or more  before being able to quit. This document explains the best ways for you to prepare to quit smoking. Quitting takes hard work and a lot of effort, but you can do it. ADVANTAGES OF QUITTING SMOKING  You will live longer, feel better, and live better.  Your body will feel the impact of quitting smoking almost immediately.  Within 20 minutes, blood pressure decreases. Your pulse returns to its normal level.  After 8 hours, carbon monoxide levels in the blood return to normal. Your oxygen level increases.  After 24 hours, the chance of having a heart attack starts to decrease. Your breath, hair, and body stop smelling like smoke.  After 48 hours, damaged nerve endings begin to recover. Your sense of taste and smell improve.  After 72 hours, the body is virtually free of nicotine. Your bronchial tubes relax and breathing becomes easier.  After 2 to 12 weeks, lungs can hold more air. Exercise becomes easier and circulation improves.  The risk of having a heart attack, stroke, cancer, or lung disease is greatly reduced.  After 1 year, the risk of coronary heart disease is cut in half.  After 5 years, the risk of stroke falls to the same as a nonsmoker.  After 10 years, the risk of lung cancer is cut in half and the risk of other cancers decreases significantly.  After 15 years, the risk of coronary heart disease drops, usually to the level of a nonsmoker.  If you are pregnant, quitting smoking will improve your chances of having a healthy baby.  The people you live with, especially any children, will be healthier.  You will have extra money to spend on things other than cigarettes. QUESTIONS TO THINK ABOUT BEFORE ATTEMPTING TO QUIT You may want to talk about your answers with your health care provider.  Why do you want to quit?  If you tried to quit in the past, what helped and what did not?  What will be the most difficult situations for you after you quit? How will you plan to  handle them?  Who can help you through the tough times? Your family? Friends? A health care provider?  What pleasures do you get from smoking? What ways can you still get pleasure if you quit? Here are some questions to ask your health care provider:  How can you help me to be successful at quitting?  What medicine do you think would be best for me and how should I take it?  What should I do if I need more help?  What is smoking withdrawal like? How can I get information on withdrawal? GET READY  Set a quit date.  Change your environment by getting rid of all cigarettes, ashtrays, matches, and lighters in your home, car, or work. Do not let people smoke in your home.  Review your past attempts to quit. Think about what worked and what did not. GET SUPPORT AND ENCOURAGEMENT You have a better chance of being successful if you have help. You can get support in many ways.  Tell your family, friends, and coworkers that you are going to quit and need their support. Ask them not   to smoke around you.  Get individual, group, or telephone counseling and support. Programs are available at local hospitals and health centers. Call your local health department for information about programs in your area.  Spiritual beliefs and practices may help some smokers quit.  Download a "quit meter" on your computer to keep track of quit statistics, such as how long you have gone without smoking, cigarettes not smoked, and money saved.  Get a self-help book about quitting smoking and staying off tobacco. LEARN NEW SKILLS AND BEHAVIORS  Distract yourself from urges to smoke. Talk to someone, go for a walk, or occupy your time with a task.  Change your normal routine. Take a different route to work. Drink tea instead of coffee. Eat breakfast in a different place.  Reduce your stress. Take a hot bath, exercise, or read a book.  Plan something enjoyable to do every day. Reward yourself for not  smoking.  Explore interactive web-based programs that specialize in helping you quit. GET MEDICINE AND USE IT CORRECTLY Medicines can help you stop smoking and decrease the urge to smoke. Combining medicine with the above behavioral methods and support can greatly increase your chances of successfully quitting smoking.  Nicotine replacement therapy helps deliver nicotine to your body without the negative effects and risks of smoking. Nicotine replacement therapy includes nicotine gum, lozenges, inhalers, nasal sprays, and skin patches. Some may be available over-the-counter and others require a prescription.  Antidepressant medicine helps people abstain from smoking, but how this works is unknown. This medicine is available by prescription.  Nicotinic receptor partial agonist medicine simulates the effect of nicotine in your brain. This medicine is available by prescription. Ask your health care provider for advice about which medicines to use and how to use them based on your health history. Your health care provider will tell you what side effects to look out for if you choose to be on a medicine or therapy. Carefully read the information on the package. Do not use any other product containing nicotine while using a nicotine replacement product.  RELAPSE OR DIFFICULT SITUATIONS Most relapses occur within the first 3 months after quitting. Do not be discouraged if you start smoking again. Remember, most people try several times before finally quitting. You may have symptoms of withdrawal because your body is used to nicotine. You may crave cigarettes, be irritable, feel very hungry, cough often, get headaches, or have difficulty concentrating. The withdrawal symptoms are only temporary. They are strongest when you first quit, but they will go away within 10-14 days. To reduce the chances of relapse, try to:  Avoid drinking alcohol. Drinking lowers your chances of successfully quitting.  Reduce the  amount of caffeine you consume. Once you quit smoking, the amount of caffeine in your body increases and can give you symptoms, such as a rapid heartbeat, sweating, and anxiety.  Avoid smokers because they can make you want to smoke.  Do not let weight gain distract you. Many smokers will gain weight when they quit, usually less than 10 pounds. Eat a healthy diet and stay active. You can always lose the weight gained after you quit.  Find ways to improve your mood other than smoking. FOR MORE INFORMATION  www.smokefree.gov  Document Released: 10/04/2001 Document Revised: 02/24/2014 Document Reviewed: 01/19/2012 [employer] Patient Information 2015 [employer]. This information is not intended to replace advice given to you by your health care provider. Make sure you discuss any questions you have with your health care   provider.    Smoking Cessation, Tips for Success If you are ready to quit smoking, congratulations! You have chosen to help yourself be healthier. Cigarettes bring nicotine, tar, carbon monoxide, and other irritants into your body. Your lungs, heart, and blood vessels will be able to work better without these poisons. There are many different ways to quit smoking. Nicotine gum, nicotine patches, a nicotine inhaler, or nicotine nasal spray can help with physical craving. Hypnosis, support groups, and medicines help break the habit of smoking. WHAT THINGS CAN I DO TO MAKE QUITTING EASIER?  Here are some tips to help you quit for good:  Pick a date when you will quit smoking completely. Tell all of your friends and family about your plan to quit on that date.  Do not try to slowly cut down on the number of cigarettes you are smoking. Pick a quit date and quit smoking completely starting on that day.  Throw away all cigarettes.   Clean and remove all ashtrays from your home, work, and car.  On a card, write down your reasons for quitting. Carry the card with you and read it  when you get the urge to smoke.  Cleanse your body of nicotine. Drink enough water and fluids to keep your urine clear or pale yellow. Do this after quitting to flush the nicotine from your body.  Learn to predict your moods. Do not let a bad situation be your excuse to have a cigarette. Some situations in your life might tempt you into wanting a cigarette.  Never have "just one" cigarette. It leads to wanting another and another. Remind yourself of your decision to quit.  Change habits associated with smoking. If you smoked while driving or when feeling stressed, try other activities to replace smoking. Stand up when drinking your coffee. Brush your teeth after eating. Sit in a different chair when you read the paper. Avoid alcohol while trying to quit, and try to drink fewer caffeinated beverages. Alcohol and caffeine may urge you to smoke.  Avoid foods and drinks that can trigger a desire to smoke, such as sugary or spicy foods and alcohol.  Ask people who smoke not to smoke around you.  Have something planned to do right after eating or having a cup of coffee. For example, plan to take a walk or exercise.  Try a relaxation exercise to calm you down and decrease your stress. Remember, you may be tense and nervous for the first 2 weeks after you quit, but this will pass.  Find new activities to keep your hands busy. Play with a pen, coin, or rubber band. Doodle or draw things on paper.  Brush your teeth right after eating. This will help cut down on the craving for the taste of tobacco after meals. You can also try mouthwash.   Use oral substitutes in place of cigarettes. Try using lemon drops, carrots, cinnamon sticks, or chewing gum. Keep them handy so they are available when you have the urge to smoke.  When you have the urge to smoke, try deep breathing.  Designate your home as a nonsmoking area.  If you are a heavy smoker, ask your health care provider about a prescription for  nicotine chewing gum. It can ease your withdrawal from nicotine.  Reward yourself. Set aside the cigarette money you save and buy yourself something nice.  Look for support from others. Join a support group or smoking cessation program. Ask someone at home or at work to  help you with your plan to quit smoking.  Always ask yourself, "Do I need this cigarette or is this just a reflex?" Tell yourself, "Today, I choose not to smoke," or "I do not want to smoke." You are reminding yourself of your decision to quit.  Do not replace cigarette smoking with electronic cigarettes (commonly called e-cigarettes). The safety of e-cigarettes is unknown, and some may contain harmful chemicals.  If you relapse, do not give up! Plan ahead and think about what you will do the next time you get the urge to smoke. HOW WILL I FEEL WHEN I QUIT SMOKING? You may have symptoms of withdrawal because your body is used to nicotine (the addictive substance in cigarettes). You may crave cigarettes, be irritable, feel very hungry, cough often, get headaches, or have difficulty concentrating. The withdrawal symptoms are only temporary. They are strongest when you first quit but will go away within 10-14 days. When withdrawal symptoms occur, stay in control. Think about your reasons for quitting. Remind yourself that these are signs that your body is healing and getting used to being without cigarettes. Remember that withdrawal symptoms are easier to treat than the major diseases that smoking can cause.  Even after the withdrawal is over, expect periodic urges to smoke. However, these cravings are generally short lived and will go away whether you smoke or not. Do not smoke! WHAT RESOURCES ARE AVAILABLE TO HELP ME QUIT SMOKING? Your health care provider can direct you to community resources or hospitals for support, which may include:  Group support.  Education.  Hypnosis.  Therapy. Document Released: 07/08/2004 Document  Revised: 02/24/2014 Document Reviewed: 03/28/2013 [facility] Patient Information 2015 [place], [place], [place]. This information is not intended to replace advice given to you by your health care provider. Make sure you discuss any questions you have with your health care provider.  [employer] smoking cessation class, to register, call: [phone]

## 2015-06-03 NOTE — Progress Notes (Signed)
Established Carotid Patient   History of Present Illness  [name] is a 74 y.o. female patient of Dr. Scot Dock who is status post left carotid endarterectomy in 2012. Dr. Scot Dock has been following a 60-79% right carotid stenosis. She comes in for a 6 month follow up visit. She is on aspirin. She does not tolerate statins.  At her last visit she complained of leg pain although she did have palpable pulses they were diminished. Her pain occurs with standing and she does have significant back issues including DJD. The plan was when she returns in 6 months we will obtain ABIs.   The patient denies any history of TIA or stroke symptoms, specifically the patient denies a history of amaurosis fugax or monocular blindness, denies a history unilateral  of facial drooping, denies a history of hemiplegia, and denies a history of receptive or expressive aphasia.   She has had what sounds like a cardiac ablation for rapid heart rate, Dr. Fara Chute (sp?), [place].  The patient denies New Medical or Surgical History.  Pt Diabetic: no Pt smoker: smoker  (1-2 ppd, started in 1984)  Pt meds include: Statin : no ASA: yes Other anticoagulants/antiplatelets: no   Past Medical History  Diagnosis Date  . COPD (chronic obstructive pulmonary disease)   . Reflux   . Shortness of breath 03/11/2010    while laying still  . Hyperlipidemia   . Osteoporosis   . Arthritis   . Bronchitis   . Anxiety   . Pure hypercholesterolemia   . Hypersomnia with sleep apnea, unspecified   . Dysphagia, unspecified(787.20)   . Chronic airway obstruction, not elsewhere classified   . Generalized convulsive epilepsy without mention of intractable epilepsy   . Carotid artery occlusion   . CHF (congestive heart failure)   . CAD (coronary artery disease)     Social History Social History  Substance Use Topics  . Smoking status: Current Every Day Smoker -- 2.00 packs/day for 30 years    Types: Cigarettes  .  Smokeless tobacco: Never Used  . Alcohol Use: No    Family History Family History  Problem Relation Age of Onset  . Heart disease Mother     CHF  . Hyperlipidemia Mother   . Hypertension Mother   . Heart attack Mother   . Deep vein thrombosis Mother   . AAA (abdominal aortic aneurysm) Mother   . Heart disease Father   . Hyperlipidemia Father   . Hypertension Father   . Heart attack Father   . Cancer Sister   . Hyperlipidemia Sister   . Hypertension Sister   . Cancer Brother   . Hyperlipidemia Brother     Surgical History Past Surgical History  Procedure Laterality Date  . Carotid endarterectomy  11/11/2010    left with Dacron patch angioplasty  . Hernia repair    . Cesarean section      2 c-sections  . Tonsillectomy and adenoidectomy    . Abdominal hysterectomy      Allergies  Allergen Reactions  . Codeine Shortness Of Breath    Current Outpatient Prescriptions  Medication Sig Dispense Refill  . Albuterol (PROVENTIL IN) Inhale into the lungs as needed.      Marland Kitchen alendronate (FOSAMAX) 70 MG tablet Take 70 mg by mouth every 7 (seven) days. Take with a full glass of water on an empty stomach.     Marland Kitchen aspirin 81 MG tablet Take 81 mg by mouth 2 (two) times daily.      Marland Kitchen  bethanechol (URECHOLINE) 25 MG tablet     . calcium-vitamin D (OSCAL WITH D) 500-200 MG-UNIT per tablet Take 1,000 tablets by mouth daily.    . chlorhexidine (PERIDEX) 0.12 % solution     . esomeprazole (NEXIUM) 40 MG capsule Take 40 mg by mouth daily as needed.      . Fluticasone-Salmeterol (ADVAIR HFA IN) Inhale into the lungs.      Marland Kitchen HYDROcodone-acetaminophen (LORTAB) 10-500 MG per tablet     . L-ARGININE PO Take by mouth daily.      Marland Kitchen oxycodone (OXY-IR) 5 MG capsule Take 5 mg by mouth every 4 (four) hours as needed.    Marland Kitchen Phenytoin (DILANTIN PO) Take 100 mg by mouth 3 (three) times daily. 2 cap     . predniSONE (DELTASONE) 10 MG tablet Take 10 mg by mouth daily as needed.      . tiotropium (SPIRIVA) 18  MCG inhalation capsule Place 18 mcg into inhaler and inhale daily.    . vitamin E 800 UNIT capsule Take 800 Units by mouth daily.       No current facility-administered medications for this visit.    Review of Systems : See HPI for pertinent positives and negatives.  Physical Examination  Filed Vitals:   06/03/15 1122 06/03/15 1134  BP: 126/76 148/64  Pulse: 63   Height: 5\' 2"  (1.575 m)   Weight: 110 lb (49.896 kg)   SpO2: 100%    Body mass index is 20.11 kg/(m^2).  General: WDWN thin female in NAD GAIT: normal Eyes: PERRLA Pulmonary:  Non-labored, limited air movement in all fields, rhonchi in left posterior fields, no rales,  no wheezing.  Cardiac: regular rhythm, no detected murmur.  VASCULAR EXAM  Carotid Bruits Right Left   Negative Negative    Aorta is not palpable. Radial pulses are 1+ palpable and equal.                                                                                                                            LE Pulses Right Left       POPLITEAL  not palpable   not palpable       POSTERIOR TIBIAL  not palpable   not palpable        DORSALIS PEDIS      ANTERIOR TIBIAL faintly palpable  not palpable     Gastrointestinal: soft, nontender, BS WNL, no r/g,  no palpable masses.  Musculoskeletal: age and COPD related muscle atrophy/wasting. M/S 4/5 throughout, extremities without ischemic changes. 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   Neurologic: A&O X 3; Appropriate Affect, Speech is normal CN 2-12 intact, pain and light touch intact in extremities, Motor exam as listed above.   Non-Invasive Vascular Imaging CAROTID DUPLEX 06/03/2015   CEREBROVASCULAR DUPLEX EVALUATION    INDICATION: Carotid stenosis    PREVIOUS INTERVENTION(S): Left carotid endarterectomy 11/11/2010.    DUPLEX EXAM:     RIGHT  LEFT  Peak Systolic  Velocities (cm/s) End Diastolic Velocities (cm/s) Plaque LOCATION Peak Systolic Velocities (cm/s) End Diastolic  Velocities (cm/s) Plaque  81 22  CCA PROXIMAL 86 23   59 18  CCA MID 73 25 HT  64 21 CP CCA DISTAL 59 13 HT  174 25 CP ECA 130 27   283 97 CP ICA PROXIMAL 68 31 HT  191 58 CP ICA MID 100 38   64 24  ICA DISTAL 90 35     4.8 ICA / CCA Ratio (PSV) 0.93  Antegrade Vertebral Flow Antegrade  338 Brachial Systolic Pressure (mmHg) 250  Triphasic Brachial Artery Waveforms Triphasic    Plaque Morphology:  HM = Homogeneous, HT = Heterogeneous, CP = Calcific Plaque, SP = Smooth Plaque, IP = Irregular Plaque  ADDITIONAL FINDINGS: Right subclavian artery PSV119cm/sec ;Left subclavian artery PSV 108cm/sec    IMPRESSION: Right internal carotid artery stenosis present in the 60%-79% range, which may be underestimated due to calcific plaque present making Doppler interrogation difficult. Left internal carotid artery is patent with history of carotid endarterectomy, mild hyperplasia present at the proximal to mid endarterectomized segment without hemodynamically significant changes present.    Compared to the previous exam:  Essentially unchanged since previous study on 11/26/2014.    ABI (Date: 06/03/2015)  R: 0.98 (no previous for comparison), DP: triphasic, PT: biphasic, TBI: 0.81  L: 1.08 (no previous for comparison), DP: triphasic, PT: triphasic, TBI: 1.04    Assessment: [name] is a 74 y.o. female who is status post left carotid endarterectomy in 2012. Dr. Scot Dock has been following a 60-79% right carotid stenosis. She has no history of stroke or TIA. Today's carotid Duplex suggests  60%-79% right internal carotid artery stenosis and patent left internal carotid artery with history of carotid endarterectomy, mild hyperplasia present at the proximal to mid endarterectomized segment without hemodynamically significant changes. Essentially unchanged since previous study on 11/26/2014.  Her pedal pulses are diminished to palpation, her legs feel weak when she walks; she has known  osteoporosis and DJD in her back. Today's ABI's and TBI's are normal with mostly triphasic waveforms, right PT is biphasic: no evidence of arterial occlusive disease.   Fortunately she does not have DM. Her primary atherosclerotic risk factor is long term smoking, currently 1-2 ppd.  Face to face time with patient was 25 minutes. Over 50% of this time was spent on counseling and coordination of care.   Plan:  The patient was counseled re smoking cessation and given several free resources re smoking cessation.  Follow-up in 6 months with Carotid Duplex.   I discussed in depth with the patient the nature of atherosclerosis, and emphasized the importance of maximal medical management including strict control of blood pressure, blood glucose, and lipid levels, obtaining regular exercise, and cessation of smoking.  The patient is aware that without maximal medical management the underlying atherosclerotic disease process will progress, limiting the benefit of any interventions. The patient was given information about stroke prevention and what symptoms should prompt the patient to seek immediate medical care. Thank you for allowing [place] to participate in this patient's care.  Clemon Chambers, RN, MSN, FNP-C [facility] Office: [phone]  Clinic Physician: Scot Dock  06/03/2015 11:39 AM

## 2016-01-01 ENCOUNTER — Encounter: Payer: Self-pay | Admitting: Family

## 2016-01-06 ENCOUNTER — Encounter (HOSPITAL_COMMUNITY): Payer: Commercial Managed Care - HMO

## 2016-01-06 ENCOUNTER — Ambulatory Visit: Payer: Commercial Managed Care - HMO | Admitting: Family

## 2016-02-16 ENCOUNTER — Encounter: Payer: Self-pay | Admitting: Family

## 2016-02-22 ENCOUNTER — Ambulatory Visit (HOSPITAL_COMMUNITY)
Admission: RE | Admit: 2016-02-22 | Discharge: 2016-02-22 | Disposition: A | Discharge status: Home / Self Care | Payer: Commercial Managed Care - HMO | Source: Ambulatory Visit | Attending: Vascular Surgery | Admitting: Vascular Surgery

## 2016-02-22 ENCOUNTER — Encounter: Payer: Self-pay | Admitting: Family

## 2016-02-22 ENCOUNTER — Ambulatory Visit (INDEPENDENT_AMBULATORY_CARE_PROVIDER_SITE_OTHER): Payer: Commercial Managed Care - HMO | Admitting: Family

## 2016-02-22 VITALS — BP 117/71 | HR 71 | Temp 97.0°F | Resp 20 | Ht 62.0 in | Wt 114.5 lb

## 2016-02-22 DIAGNOSIS — I6523 Occlusion and stenosis of bilateral carotid arteries: Secondary | ICD-10-CM | POA: Diagnosis not present

## 2016-02-22 DIAGNOSIS — Z9889 Other specified postprocedural states: Secondary | ICD-10-CM

## 2016-02-22 DIAGNOSIS — I2510 Atherosclerotic heart disease of native coronary artery without angina pectoris: Secondary | ICD-10-CM | POA: Insufficient documentation

## 2016-02-22 DIAGNOSIS — Z720 Tobacco use: Secondary | ICD-10-CM | POA: Diagnosis not present

## 2016-02-22 DIAGNOSIS — Z48812 Encounter for surgical aftercare following surgery on the circulatory system: Secondary | ICD-10-CM | POA: Insufficient documentation

## 2016-02-22 DIAGNOSIS — I509 Heart failure, unspecified: Secondary | ICD-10-CM | POA: Diagnosis not present

## 2016-02-22 DIAGNOSIS — I6529 Occlusion and stenosis of unspecified carotid artery: Secondary | ICD-10-CM | POA: Insufficient documentation

## 2016-02-22 DIAGNOSIS — F17200 Nicotine dependence, unspecified, uncomplicated: Secondary | ICD-10-CM

## 2016-02-22 DIAGNOSIS — R079 Chest pain, unspecified: Secondary | ICD-10-CM

## 2016-02-22 DIAGNOSIS — K219 Gastro-esophageal reflux disease without esophagitis: Secondary | ICD-10-CM | POA: Diagnosis not present

## 2016-02-22 DIAGNOSIS — I6521 Occlusion and stenosis of right carotid artery: Secondary | ICD-10-CM | POA: Diagnosis not present

## 2016-02-22 DIAGNOSIS — E785 Hyperlipidemia, unspecified: Secondary | ICD-10-CM | POA: Insufficient documentation

## 2016-02-22 DIAGNOSIS — I499 Cardiac arrhythmia, unspecified: Secondary | ICD-10-CM

## 2016-02-22 NOTE — Progress Notes (Signed)
Chief Complaint: Follow up Extracranial Carotid Artery Stenosis   History of Present Illness  [name] is a 75 y.o. female patient of Dr. Scot Dock who is status post left carotid endarterectomy in 2012. Dr. Scot Dock has been following a 60-79% right carotid stenosis. She comes in for a 6 month follow up visit. She is on aspirin. She does not tolerate statins.  The patient denies any history of TIA or stroke symptoms, specifically the patient denies a history of amaurosis fugax or monocular blindness, denies a history unilateral of facial drooping, denies a history of hemiplegia, and denies a history of receptive or expressive aphasia.   She has had what sounds like a cardiac ablation for rapid heart rate, Dr. Fara Chute (sp?), [place].  She feels like she wants to sleep all the time, she cut back on her Dilantin on her own volition and had plenty of energy.  She is scheduled to see a neurologist on 03/01/16.  Pt states she has had   She has been waking up some days with a headache in the last couple of months that resolves over the course of the day.  She has had intermittent left side chest pain for a year, states she last saw Dr. Bettina Gavia 2 years ago and had a stress test at that time. She has felt intermittent irregular heart beat for about a year.  Pt Diabetic: no Pt smoker: smoker (1-2 ppd, started in 1984)  Pt meds include: Statin : no ASA: yes Other anticoagulants/antiplatelets: no  Past Medical History  Diagnosis Date  . COPD (chronic obstructive pulmonary disease) ([place])   . Reflux   . Shortness of breath 03/11/2010    while laying still  . Hyperlipidemia   . Osteoporosis   . Arthritis   . Bronchitis   . Anxiety   . Pure hypercholesterolemia   . Hypersomnia with sleep apnea, unspecified   . Dysphagia, unspecified(787.20)   . Chronic airway obstruction, not elsewhere classified   . Generalized convulsive epilepsy without mention of intractable epilepsy    . Carotid artery occlusion   . CHF (congestive heart failure) ([place])   . CAD (coronary artery disease)     Social History Social History  Substance Use Topics  . Smoking status: Current Every Day Smoker -- 2.00 packs/day for 30 years    Types: Cigarettes  . Smokeless tobacco: Never Used  . Alcohol Use: No    Family History Family History  Problem Relation Age of Onset  . Heart disease Mother     CHF  . Hyperlipidemia Mother   . Hypertension Mother   . Heart attack Mother   . Deep vein thrombosis Mother   . AAA (abdominal aortic aneurysm) Mother   . Heart disease Father   . Hyperlipidemia Father   . Hypertension Father   . Heart attack Father   . Cancer Sister   . Hyperlipidemia Sister   . Hypertension Sister   . Cancer Brother   . Hyperlipidemia Brother     Surgical History Past Surgical History  Procedure Laterality Date  . Carotid endarterectomy  11/11/2010    left with Dacron patch angioplasty  . Hernia repair    . Cesarean section      2 c-sections  . Tonsillectomy and adenoidectomy    . Abdominal hysterectomy      Allergies  Allergen Reactions  . Codeine Shortness Of Breath    Current Outpatient Prescriptions  Medication Sig Dispense Refill  . Albuterol (PROVENTIL  IN) Inhale into the lungs as needed.      Marland Kitchen alendronate (FOSAMAX) 70 MG tablet Take 70 mg by mouth every 7 (seven) days. Take with a full glass of water on an empty stomach.     Marland Kitchen aspirin 81 MG tablet Take 81 mg by mouth 2 (two) times daily.      . calcium-vitamin D (OSCAL WITH D) 500-200 MG-UNIT per tablet Take 1,000 tablets by mouth daily.    . chlorhexidine (PERIDEX) 0.12 % solution     . Cyanocobalamin (VITAMIN B-12) 5000 MCG TBDP Take 1 tablet by mouth daily.    Marland Kitchen esomeprazole (NEXIUM) 40 MG capsule Take 40 mg by mouth daily as needed.      Marland Kitchen HYDROcodone-acetaminophen (LORTAB) 10-500 MG per tablet     . Multiple Vitamin (MULTIVITAMIN WITH MINERALS) TABS tablet Take 1 tablet by mouth  daily.    Marland Kitchen Phenytoin (DILANTIN PO) Take 100 mg by mouth 3 (three) times daily. 2 cap     . predniSONE (DELTASONE) 10 MG tablet Take 10 mg by mouth daily as needed.      . tiotropium (SPIRIVA) 18 MCG inhalation capsule Place 18 mcg into inhaler and inhale daily.    . vitamin E 800 UNIT capsule Take 800 Units by mouth daily.      . bethanechol (URECHOLINE) 25 MG tablet Reported on 02/22/2016    . Fluticasone-Salmeterol (ADVAIR HFA IN) Inhale into the lungs. Reported on 02/22/2016    . L-ARGININE PO Take by mouth daily. Reported on 02/22/2016    . oxycodone (OXY-IR) 5 MG capsule Take 5 mg by mouth every 4 (four) hours as needed. Reported on 02/22/2016     No current facility-administered medications for this visit.    Review of Systems : See HPI for pertinent positives and negatives.  Physical Examination  Filed Vitals:   02/22/16 1234  BP: 117/71  Pulse: 71  Temp: 97 F (36.1 C)  TempSrc: Oral  Resp: 20  Height: 5\' 2"  (1.575 m)  Weight: 114 lb 8 oz (51.937 kg)   Body mass index is 20.94 kg/(m^2).  General: WDWN thin female in NAD GAIT: normal Eyes: PERRLA Pulmonary: Non-labored, limited air movement in all fields, rhonchi in left posterior fields, no rales, no wheezing.  Cardiac: regular rhythm with occasional premature contractions with compensatory pauses, no detected murmur.  VASCULAR EXAM  Carotid Bruits Right Left   Negative Negative   Aorta is not palpable. Radial pulses are 1+ palpable and equal.      LE Pulses Right Left   POPLITEAL not palpable  not palpable   POSTERIOR TIBIAL not palpable  not palpable    DORSALIS PEDIS  ANTERIOR TIBIAL faintly palpable  not palpable     Gastrointestinal: soft, nontender, BS WNL, no r/g, no palpable masses.  Musculoskeletal: age and COPD related muscle  atrophy/wasting. M/S 4/5 throughout, extremities without ischemic changes. Multiple small varicosities in lower legs, no peripheral edema.   Neurologic: A&O X 3; Appropriate Affect, Speech is slightly hesitating at times, CN 2-12 intact except is very hard of hearing, pain and light touch intact in extremities, Motor exam as listed above.          Non-Invasive Vascular Imaging CAROTID DUPLEX 02/22/2016   CEREBROVASCULAR DUPLEX EVALUATION    INDICATION: Carotid artery disease    PREVIOUS INTERVENTION(S): Left carotid endarterectomy 11/11/2010    DUPLEX EXAM: Carotid duplex    RIGHT  LEFT  Peak Systolic Velocities (cm/s) End Diastolic Velocities (cm/s)  Plaque LOCATION Peak Systolic Velocities (cm/s) End Diastolic Velocities (cm/s) Plaque  89 19  CCA PROXIMAL 88 26   67 15  CCA MID 87 29 HT  75 20  CCA DISTAL 73 19 HT  202 21  ECA 93 17 HT  289 112  ICA PROXIMAL 80 30 HT  91 31  ICA MID 67 24   74 22  ICA DISTAL 75 28     4.3 ICA / CCA Ratio (PSV) N/A  Antegrade Vertebral Flow Antegrade  - Brachial Systolic Pressure (mmHg) -  Triphasic Brachial Artery Waveforms Triphasic    Plaque Morphology:  HM = Homogeneous, HT = Heterogeneous, CP = Calcific Plaque, SP = Smooth Plaque, IP = Irregular Plaque     ADDITIONAL FINDINGS: Biphasic subclavian arteries    IMPRESSION: 1. 60 - 79% right internal carotid artery stenosis upper end of range, calcific plaque may obscure higher velocity 2. Patent left carotid endarterectomy site with no evidence for restenosis    Compared to the previous exam:  Increase in right diastolic since exam of 123XX123      Assessment: LATAJA CHEATWOOD is a 75 y.o. female who is status post left carotid endarterectomy in 2012. Dr. Scot Dock has been following a 60-79% right carotid stenosis. She has no history of stroke or TIA.  Today's carotid Duplex suggests 60 - 79% right internal carotid artery stenosis upper end of range, calcific plaque may obscure  higher velocity Patent left carotid endarterectomy site with no evidence for restenosis. Increase in right diastolic since exam of 123XX123.  I advised pt to see Dr. Bettina Gavia within a week re her year hx of intermittent left side chest pain and irregular heartbeat; if not possible to see her cardiologist soon then be evaluated in the ED or by her PCP. She is scheduled to see a neurologist on 03/01/16. She has no lateralizing neurological sx's recently.  I discussed the above with Dr. Trula Slade pt's HPI and EDV of the right proximal ICA today (112) compared to August 2016 (97).  Her atherosclerotic risk factors include long term and current smoking.   Plan: Follow-up in 3 months with Carotid Duplex scan and see Dr. Scot Dock. The patient was counseled re smoking cessation and given several free resources re smoking cessation.    I discussed in depth with the patient the nature of atherosclerosis, and emphasized the importance of maximal medical management including strict control of blood pressure, blood glucose, and lipid levels, obtaining regular exercise, and cessation of smoking.  The patient is aware that without maximal medical management the underlying atherosclerotic disease process will progress, limiting the benefit of any interventions. The patient was given information about stroke prevention and what symptoms should prompt the patient to seek immediate medical care. Thank you for allowing [place] to participate in this patient's care.  Clemon Chambers, RN, MSN, FNP-C [facility] Office: [facility] Physician: Trula Slade  02/22/2016 12:41 PM

## 2016-02-22 NOTE — Patient Instructions (Addendum)
Stroke Prevention Some medical conditions and behaviors are associated with an increased chance of having a stroke. You may prevent a stroke by making healthy choices and managing medical conditions. HOW CAN I REDUCE MY RISK OF HAVING A STROKE?   Stay physically active. Get at least 30 minutes of activity on most or all days.  Do not smoke. It may also be helpful to avoid exposure to secondhand smoke.  Limit alcohol use. Moderate alcohol use is considered to be:  No more than 2 drinks per day for men.  No more than 1 drink per day for nonpregnant women.  Eat healthy foods. This involves:  Eating 5 or more servings of fruits and vegetables a day.  Making dietary changes that address high blood pressure (hypertension), high cholesterol, diabetes, or obesity.  Manage your cholesterol levels.  Making food choices that are high in fiber and low in saturated fat, trans fat, and cholesterol may control cholesterol levels.  Take any prescribed medicines to control cholesterol as directed by your health care provider.  Manage your diabetes.  Controlling your carbohydrate and sugar intake is recommended to manage diabetes.  Take any prescribed medicines to control diabetes as directed by your health care provider.  Control your hypertension.  Making food choices that are low in salt (sodium), saturated fat, trans fat, and cholesterol is recommended to manage hypertension.  Ask your health care provider if you need treatment to lower your blood pressure. Take any prescribed medicines to control hypertension as directed by your health care provider.  If you are 18-39 years of age, have your blood pressure checked every 3-5 years. If you are 40 years of age or older, have your blood pressure checked every year.  Maintain a healthy weight.  Reducing calorie intake and making food choices that are low in sodium, saturated fat, trans fat, and cholesterol are recommended to manage  weight.  Stop drug abuse.  Avoid taking birth control pills.  Talk to your health care provider about the risks of taking birth control pills if you are over 35 years old, smoke, get migraines, or have ever had a blood clot.  Get evaluated for sleep disorders (sleep apnea).  Talk to your health care provider about getting a sleep evaluation if you snore a lot or have excessive sleepiness.  Take medicines only as directed by your health care provider.  For some people, aspirin or blood thinners (anticoagulants) are helpful in reducing the risk of forming abnormal blood clots that can lead to stroke. If you have the irregular heart rhythm of atrial fibrillation, you should be on a blood thinner unless there is a good reason you cannot take them.  Understand all your medicine instructions.  Make sure that other conditions (such as anemia or atherosclerosis) are addressed. SEEK IMMEDIATE MEDICAL CARE IF:   You have sudden weakness or numbness of the face, arm, or leg, especially on one side of the body.  Your face or eyelid droops to one side.  You have sudden confusion.  You have trouble speaking (aphasia) or understanding.  You have sudden trouble seeing in one or both eyes.  You have sudden trouble walking.  You have dizziness.  You have a loss of balance or coordination.  You have a sudden, severe headache with no known cause.  You have new chest pain or an irregular heartbeat. Any of these symptoms may represent a serious problem that is an emergency. Do not wait to see if the symptoms will   go away. Get medical help at once. Call your local emergency services (911 in [place] Do not drive yourself to the hospital.   This information is not intended to replace advice given to you by your health care provider. Make sure you discuss any questions you have with your health care provider.   Document Released: 11/17/2004 Document Revised: 10/31/2014 Document Reviewed:  04/12/2013 Elsevier Interactive Patient Education 2016 Elsevier Inc.     Steps to Quit Smoking  Smoking tobacco can be harmful to your health and can affect almost every organ in your body. Smoking puts you, and those around you, at risk for developing many serious chronic diseases. Quitting smoking is difficult, but it is one of the best things that you can do for your health. It is never too late to quit. WHAT ARE THE BENEFITS OF QUITTING SMOKING? When you quit smoking, you lower your risk of developing serious diseases and conditions, such as:  Lung cancer or lung disease, such as COPD.  Heart disease.  Stroke.  Heart attack.  Infertility.  Osteoporosis and bone fractures. Additionally, symptoms such as coughing, wheezing, and shortness of breath may get better when you quit. You may also find that you get sick less often because your body is stronger at fighting off colds and infections. If you are pregnant, quitting smoking can help to reduce your chances of having a baby of low birth weight. HOW DO I GET READY TO QUIT? When you decide to quit smoking, create a plan to make sure that you are successful. Before you quit:  Pick a date to quit. Set a date within the next two weeks to give you time to prepare.  Write down the reasons why you are quitting. Keep this list in places where you will see it often, such as on your bathroom mirror or in your car or wallet.  Identify the people, places, things, and activities that make you want to smoke (triggers) and avoid them. Make sure to take these actions:  Throw away all cigarettes at home, at work, and in your car.  Throw away smoking accessories, such as ashtrays and lighters.  Clean your car and make sure to empty the ashtray.  Clean your home, including curtains and carpets.  Tell your family, friends, and coworkers that you are quitting. Support from your loved ones can make quitting easier.  Talk with your health care  provider about your options for quitting smoking.  Find out what treatment options are covered by your health insurance. WHAT STRATEGIES CAN I USE TO QUIT SMOKING?  Talk with your healthcare provider about different strategies to quit smoking. Some strategies include:  Quitting smoking altogether instead of gradually lessening how much you smoke over a period of time. Research shows that quitting "cold turkey" is more successful than gradually quitting.  Attending in-person counseling to help you build problem-solving skills. You are more likely to have success in quitting if you attend several counseling sessions. Even short sessions of 10 minutes can be effective.  Finding resources and support systems that can help you to quit smoking and remain smoke-free after you quit. These resources are most helpful when you use them often. They can include:  Online chats with a counselor.  Telephone quitlines.  Printed self-help materials.  Support groups or group counseling.  Text messaging programs.  Mobile phone applications.  Taking medicines to help you quit smoking. (If you are pregnant or breastfeeding, talk with your health care provider first.) Some   medicines contain nicotine and some do not. Both types of medicines help with cravings, but the medicines that include nicotine help to relieve withdrawal symptoms. Your health care provider may recommend:  Nicotine patches, gum, or lozenges.  Nicotine inhalers or sprays.  Non-nicotine medicine that is taken by mouth. Talk with your health care provider about combining strategies, such as taking medicines while you are also receiving in-person counseling. Using these two strategies together makes you more likely to succeed in quitting than if you used either strategy on its own. If you are pregnant or breastfeeding, talk with your health care provider about finding counseling or other support strategies to quit smoking. Do not take  medicine to help you quit smoking unless told to do so by your health care provider. WHAT THINGS CAN I DO TO MAKE IT EASIER TO QUIT? Quitting smoking might feel overwhelming at first, but there is a lot that you can do to make it easier. Take these important actions:  Reach out to your family and friends and ask that they support and encourage you during this time. Call telephone quitlines, reach out to support groups, or work with a counselor for support.  Ask people who smoke to avoid smoking around you.  Avoid places that trigger you to smoke, such as bars, parties, or smoke-break areas at work.  Spend time around people who do not smoke.  Lessen stress in your life, because stress can be a smoking trigger for some people. To lessen stress, try:  Exercising regularly.  Deep-breathing exercises.  Yoga.  Meditating.  Performing a body scan. This involves closing your eyes, scanning your body from head to toe, and noticing which parts of your body are particularly tense. Purposefully relax the muscles in those areas.  Download or purchase mobile phone or tablet apps (applications) that can help you stick to your quit plan by providing reminders, tips, and encouragement. There are many free apps, such as QuitGuide from the CDC (Centers for Disease Control and Prevention). You can find other support for quitting smoking (smoking cessation) through smokefree.gov and other websites. HOW WILL I FEEL WHEN I QUIT SMOKING? Within the first 24 hours of quitting smoking, you may start to feel some withdrawal symptoms. These symptoms are usually most noticeable 2-3 days after quitting, but they usually do not last beyond 2-3 weeks. Changes or symptoms that you might experience include:  Mood swings.  Restlessness, anxiety, or irritation.  Difficulty concentrating.  Dizziness.  Strong cravings for sugary foods in addition to nicotine.  Mild weight  gain.  Constipation.  Nausea.  Coughing or a sore throat.  Changes in how your medicines work in your body.  A depressed mood.  Difficulty sleeping (insomnia). After the first 2-3 weeks of quitting, you may start to notice more positive results, such as:  Improved sense of smell and taste.  Decreased coughing and sore throat.  Slower heart rate.  Lower blood pressure.  Clearer skin.  The ability to breathe more easily.  Fewer sick days. Quitting smoking is very challenging for most people. Do not get discouraged if you are not successful the first time. Some people need to make many attempts to quit before they achieve long-term success. Do your best to stick to your quit plan, and talk with your health care provider if you have any questions or concerns.   This information is not intended to replace advice given to you by your health care provider. Make sure you discuss any questions   you have with your health care provider.   Document Released: 10/04/2001 Document Revised: 02/24/2015 Document Reviewed: 02/24/2015 Elsevier Interactive Patient Education 2016 Elsevier Inc.  

## 2016-05-26 ENCOUNTER — Encounter: Payer: Self-pay | Admitting: Vascular Surgery

## 2016-05-30 ENCOUNTER — Other Ambulatory Visit: Payer: Self-pay

## 2016-05-30 DIAGNOSIS — I6529 Occlusion and stenosis of unspecified carotid artery: Secondary | ICD-10-CM

## 2016-06-01 ENCOUNTER — Ambulatory Visit (HOSPITAL_COMMUNITY)
Admission: RE | Admit: 2016-06-01 | Discharge: 2016-06-01 | Disposition: A | Discharge status: Home / Self Care | Payer: Commercial Managed Care - HMO | Source: Ambulatory Visit | Attending: Vascular Surgery | Admitting: Vascular Surgery

## 2016-06-01 ENCOUNTER — Encounter: Payer: Self-pay | Admitting: Vascular Surgery

## 2016-06-01 ENCOUNTER — Ambulatory Visit (INDEPENDENT_AMBULATORY_CARE_PROVIDER_SITE_OTHER): Payer: Commercial Managed Care - HMO | Admitting: Vascular Surgery

## 2016-06-01 VITALS — BP 143/79 | HR 71 | Temp 97.1°F | Resp 16 | Ht 62.0 in | Wt 114.0 lb

## 2016-06-01 DIAGNOSIS — K219 Gastro-esophageal reflux disease without esophagitis: Secondary | ICD-10-CM | POA: Diagnosis not present

## 2016-06-01 DIAGNOSIS — I6523 Occlusion and stenosis of bilateral carotid arteries: Secondary | ICD-10-CM | POA: Diagnosis not present

## 2016-06-01 DIAGNOSIS — I6529 Occlusion and stenosis of unspecified carotid artery: Secondary | ICD-10-CM | POA: Diagnosis present

## 2016-06-01 DIAGNOSIS — I509 Heart failure, unspecified: Secondary | ICD-10-CM | POA: Diagnosis not present

## 2016-06-01 DIAGNOSIS — F419 Anxiety disorder, unspecified: Secondary | ICD-10-CM | POA: Diagnosis not present

## 2016-06-01 DIAGNOSIS — Z01810 Encounter for preprocedural cardiovascular examination: Secondary | ICD-10-CM | POA: Diagnosis not present

## 2016-06-01 DIAGNOSIS — J449 Chronic obstructive pulmonary disease, unspecified: Secondary | ICD-10-CM | POA: Insufficient documentation

## 2016-06-01 DIAGNOSIS — I2510 Atherosclerotic heart disease of native coronary artery without angina pectoris: Secondary | ICD-10-CM | POA: Diagnosis not present

## 2016-06-01 DIAGNOSIS — E785 Hyperlipidemia, unspecified: Secondary | ICD-10-CM | POA: Insufficient documentation

## 2016-06-01 LAB — VAS US CAROTID
LCCADDIAS: 11 cm/s
LCCADSYS: 45 cm/s
LCCAPDIAS: 29 cm/s
LEFT ECA DIAS: 22 cm/s
LEFT VERTEBRAL DIAS: 21 cm/s
LICADDIAS: -38 cm/s
LICAPDIAS: 26 cm/s
LICAPSYS: 63 cm/s
Left CCA prox sys: 78 cm/s
Left ICA dist sys: -89 cm/s
RCCAPDIAS: 19 cm/s
RCCAPSYS: 66 cm/s
RIGHT CCA MID DIAS: 24 cm/s
RIGHT ECA DIAS: -27 cm/s
Right cca dist sys: -100 cm/s

## 2016-06-01 NOTE — Progress Notes (Signed)
[facility]  Patient name: [name] MRN: [id number] DOB: 1941/03/06 Sex: female  REASON FOR VISIT: Follow up of carotid disease  HPI: CURTISHA WRICE is a 75 y.o. female who I last saw in February 2016. She had a left carotid endarterectomy in 2012. I have been following A 60-79% right carotid stenosis. The patient was seen by our nurse practitioner on 02/22/2016. At that time she had a 60-79% right carotid stenosis however the diastolic velocities suggested that this was very close to 80%. This reason she was set up for a follow up duplex and an office visit with me.  This patient is somewhat of a difficult historian. However, I cannot get any clear-cut history of focal weakness or paresthesias on either side. She describes some occasional blurred vision but this sounds more like cataracts and does not sound like classic amaurosis fugax. She does not describe any expressive or receptive aphasia.  She does describe chest pain and chest pressure and is followed by Dr. Bettina Gavia in [place].   She is on aspirin but does not tolerate statins.  Past Medical History:  Diagnosis Date  . Anxiety   . Arthritis   . Bronchitis   . CAD (coronary artery disease)   . Carotid artery occlusion   . CHF (congestive heart failure) ([place])   . Chronic airway obstruction, not elsewhere classified   . COPD (chronic obstructive pulmonary disease) ([place])   . Dysphagia, unspecified(787.20)   . Generalized convulsive epilepsy without mention of intractable epilepsy   . Hyperlipidemia   . Hypersomnia with sleep apnea, unspecified   . Osteoporosis   . Pure hypercholesterolemia   . Reflux   . Shortness of breath 03/11/2010   while laying still    Family History  Problem Relation Age of Onset  . Heart disease Mother     CHF  . Hyperlipidemia Mother   . Hypertension Mother   . Heart attack Mother   . Deep vein thrombosis Mother   . AAA (abdominal aortic aneurysm)  Mother   . Heart disease Father   . Hyperlipidemia Father   . Hypertension Father   . Heart attack Father   . Cancer Sister   . Hyperlipidemia Sister   . Hypertension Sister   . Cancer Brother   . Hyperlipidemia Brother     SOCIAL HISTORY: Social History  Substance Use Topics  . Smoking status: Current Every Day Smoker    Packs/day: 2.00    Years: 30.00    Types: Cigarettes  . Smokeless tobacco: Never Used     Comment: Down to 1.5 packs per day 06/01/2016  . Alcohol use No    Allergies  Allergen Reactions  . Codeine Shortness Of Breath    Current Outpatient Prescriptions  Medication Sig Dispense Refill  . Albuterol (PROVENTIL IN) Inhale into the lungs as needed.      Marland Kitchen alendronate (FOSAMAX) 70 MG tablet Take 70 mg by mouth every 7 (seven) days. Take with a full glass of water on an empty stomach.     Marland Kitchen aspirin 81 MG tablet Take 81 mg by mouth 2 (two) times daily.      . bethanechol (URECHOLINE) 25 MG tablet Reported on 02/22/2016    . calcium-vitamin D (OSCAL WITH D) 500-200 MG-UNIT per tablet Take 1,000 tablets by mouth daily.    Marland Kitchen esomeprazole (NEXIUM) 40 MG capsule Take 40 mg by mouth daily as needed.      . Multiple  Vitamin (MULTIVITAMIN WITH MINERALS) TABS tablet Take 1 tablet by mouth daily.    Marland Kitchen oxycodone (OXY-IR) 5 MG capsule Take 5 mg by mouth every 4 (four) hours as needed. Reported on 02/22/2016    . tiotropium (SPIRIVA) 18 MCG inhalation capsule Place 18 mcg into inhaler and inhale daily.    . vitamin E 800 UNIT capsule Take 800 Units by mouth daily.      . chlorhexidine (PERIDEX) 0.12 % solution     . Cyanocobalamin (VITAMIN B-12) 5000 MCG TBDP Take 1 tablet by mouth daily.    . Fluticasone-Salmeterol (ADVAIR HFA IN) Inhale into the lungs. Reported on 02/22/2016    . HYDROcodone-acetaminophen (LORTAB) 10-500 MG per tablet     . L-ARGININE PO Take by mouth daily. Reported on 02/22/2016    . Phenytoin (DILANTIN PO) Take 100 mg by mouth 3 (three) times daily. 2 cap       . predniSONE (DELTASONE) 10 MG tablet Take 10 mg by mouth daily as needed.       No current facility-administered medications for this visit.     REVIEW OF SYSTEMS:  [X]  denotes positive finding, [ ]  denotes negative finding Cardiac  Comments:  Chest pain or chest pressure: X   Shortness of breath upon exertion:    Short of breath when lying flat:    Irregular heart rhythm:        Vascular    Pain in calf, thigh, or hip brought on by ambulation: X   Pain in feet at night that wakes you up from your sleep:  X   Blood clot in your veins:    Leg swelling:         Pulmonary    Oxygen at home: X   Productive cough:  X   Wheezing:  X       Neurologic    Sudden weakness in arms or legs:     Sudden numbness in arms or legs:     Sudden onset of difficulty speaking or slurred speech:    Temporary loss of vision in one eye:     Problems with dizziness:         Gastrointestinal    Blood in stool:     Vomited blood:         Genitourinary    Burning when urinating:     Blood in urine:        Psychiatric    Major depression:         Hematologic    Bleeding problems:    Problems with blood clotting too easily:        Skin    Rashes or ulcers:        Constitutional    Fever or chills:      PHYSICAL EXAM: Vitals:   06/01/16 1333 06/01/16 1336  BP: 135/83 (!) 143/79  Pulse: 71   Resp: 16   Temp: 97.1 F (36.2 C)   TempSrc: Oral   SpO2: 97%   Weight: 114 lb (51.7 kg)   Height: 5\' 2"  (1.575 m)     GENERAL: The patient is a well-nourished female, in no acute distress. The vital signs are documented above. CARDIAC: There is a regular rate and rhythm.  VASCULAR: I do not detect carotid bruits. Both feet are warm and well-perfused. PULMONARY: There is good air exchange bilaterally without wheezing or rales. ABDOMEN: Soft and non-tender with normal pitched bowel sounds.  MUSCULOSKELETAL: There are no major deformities or  cyanosis. NEUROLOGIC: No focal weakness or  paresthesias are detected. SKIN: There are no ulcers or rashes noted. PSYCHIATRIC: The patient has a normal affect.  DATA:   CAROTID DUPLEX: I have independently interpreted her carotid duplex scan today.  On the right side, peak systolic velocity in the internal carotid arteries to 314 cm/s. End-diastolic velocity ranged from 94-144 cm/s. The patient has an irregular cardiac rhythm which made interpretation of the end-diastolic velocity more challenging. The bifurcation on the right is noted to be high.  On the left side a left carotid endarterectomy site is widely patent.  Both vertebral arteries are patent with antegrade flow.  MEDICAL ISSUES:  RIGHT CAROTID STENOSIS: it looks like the patient's right carotid stenosis has progressed to greater than 80% although because of her arrhythmias the end-diastolic velocity was somewhat difficult to interpret. In addition the bifurcation on the right was high. For this reason I aborted a CT angiogram of the neck to further assess her right carotid stenosis. If this does appear to be greater than 80% and does appear be surgically accessible, then I would recommend right carotid endarterectomy in order to lower her risk of stroke. In addition, she has been having some chest pain so we will make arrangements for her to see Dr. Bettina Gavia her cardiologist in anticipation of possible right carotid endarterectomy. She is on aspirin. She does not tolerate statins. I will see her back after her CT angiogram of the neck. Of note her left carotid endarterectomy site is widely patent without evidence of restenosis.    Deitra Mayo [facility] [phone]

## 2016-06-03 ENCOUNTER — Other Ambulatory Visit: Payer: Self-pay | Admitting: *Deleted

## 2016-06-03 DIAGNOSIS — Z01812 Encounter for preprocedural laboratory examination: Secondary | ICD-10-CM

## 2016-06-08 ENCOUNTER — Ambulatory Visit (HOSPITAL_COMMUNITY)
Admission: RE | Admit: 2016-06-08 | Discharge: 2016-06-08 | Disposition: A | Discharge status: Home / Self Care | Payer: Commercial Managed Care - HMO | Source: Ambulatory Visit | Attending: Vascular Surgery | Admitting: Vascular Surgery

## 2016-06-08 ENCOUNTER — Encounter (HOSPITAL_COMMUNITY): Payer: Self-pay

## 2016-06-08 ENCOUNTER — Other Ambulatory Visit: Payer: Self-pay | Admitting: Vascular Surgery

## 2016-06-08 DIAGNOSIS — Z01810 Encounter for preprocedural cardiovascular examination: Secondary | ICD-10-CM | POA: Diagnosis not present

## 2016-06-08 DIAGNOSIS — I6523 Occlusion and stenosis of bilateral carotid arteries: Secondary | ICD-10-CM

## 2016-06-08 DIAGNOSIS — I6521 Occlusion and stenosis of right carotid artery: Secondary | ICD-10-CM | POA: Diagnosis not present

## 2016-06-08 LAB — CREATININE, SERUM
Creatinine, Ser: 0.84 mg/dL (ref 0.44–1.00)
GFR calc non Af Amer: >60 mL/min (ref >60)

## 2016-06-08 MED ORDER — IOPAMIDOL (ISOVUE-370) INJECTION 76%
INTRAVENOUS | Status: AC
  Administered 2016-06-08: 50 mL
  Filled 2016-06-08: qty 50

## 2016-06-13 ENCOUNTER — Encounter: Payer: Self-pay | Admitting: Vascular Surgery

## 2016-06-15 ENCOUNTER — Ambulatory Visit (INDEPENDENT_AMBULATORY_CARE_PROVIDER_SITE_OTHER): Payer: Commercial Managed Care - HMO | Admitting: Vascular Surgery

## 2016-06-15 ENCOUNTER — Encounter: Payer: Self-pay | Admitting: Vascular Surgery

## 2016-06-15 VITALS — BP 128/76 | HR 67 | Temp 98.5°F | Resp 16 | Ht 62.0 in | Wt 115.0 lb

## 2016-06-15 DIAGNOSIS — I6521 Occlusion and stenosis of right carotid artery: Secondary | ICD-10-CM

## 2016-06-15 NOTE — Progress Notes (Signed)
Patient name: [name] MRN: [id number] DOB: 1941/09/05 Sex: female  REASON FOR VISIT: Follow up of carotid disease.  HPI: [name] is a 75 y.o. female who I saw on 06/01/2016. The patient had a left carotid endarterectomy in 2012. I have been following A 60-79% right carotid stenosis. By history the patient did not appear to be symptomatic. Duplex scan of the time of her last visit showed a peak systolic velocity on the right of 314 cm/s and the end-diastolic velocity ranged from 94-1 44 cm/s. The study was somewhat difficult to interpret because of an irregular cardiac rhythm. In addition, the bifurcation on the right appeared somewhat high. For this reason I recommended a CT angiogram. She also been having some chest pain and therefore we arranged for her to see Dr. Bettina Gavia her cardiologist in anticipation of possible surgery. Of note her left carotid endarterectomy site was widely patent.  Patient is right-handed. She denies any history of stroke, TIAs, expressive or receptive aphasia. She does describe some occasional loss of vision although she's not sure what side this occurs on.  She describes some occasional chest pain. She has undergone previous ablation procedures in [facility].  She is on aspirin. She is not tolerant of statins.  Past Medical History:  Diagnosis Date  . Anxiety   . Arthritis   . Bronchitis   . CAD (coronary artery disease)   . Carotid artery occlusion   . CHF (congestive heart failure) ([place])   . Chronic airway obstruction, not elsewhere classified   . COPD (chronic obstructive pulmonary disease) ([place])   . Dysphagia, unspecified(787.20)   . Generalized convulsive epilepsy without mention of intractable epilepsy   . Hyperlipidemia   . Hypersomnia with sleep apnea, unspecified   . Osteoporosis   . Pure hypercholesterolemia   . Reflux   . Shortness of breath 03/11/2010   while laying still    Family History  Problem Relation Age of Onset    . Heart disease Mother     CHF  . Hyperlipidemia Mother   . Hypertension Mother   . Heart attack Mother   . Deep vein thrombosis Mother   . AAA (abdominal aortic aneurysm) Mother   . Heart disease Father   . Hyperlipidemia Father   . Hypertension Father   . Heart attack Father   . Cancer Sister   . Hyperlipidemia Sister   . Hypertension Sister   . Cancer Brother   . Hyperlipidemia Brother     SOCIAL HISTORY: Social History  Substance Use Topics  . Smoking status: Current Every Day Smoker    Packs/day: 2.00    Years: 30.00    Types: Cigarettes  . Smokeless tobacco: Never Used     Comment: Down to 1.5 packs per day 06/01/2016  . Alcohol use No    Allergies  Allergen Reactions  . Codeine Shortness Of Breath    Current Outpatient Prescriptions  Medication Sig Dispense Refill  . Albuterol (PROVENTIL IN) Inhale into the lungs as needed.      Marland Kitchen alendronate (FOSAMAX) 70 MG tablet Take 70 mg by mouth every 7 (seven) days. Take with a full glass of water on an empty stomach.     Marland Kitchen aspirin 81 MG tablet Take 81 mg by mouth 2 (two) times daily.      . Multiple Vitamin (MULTIVITAMIN WITH MINERALS) TABS tablet Take 1 tablet by mouth daily.    Marland Kitchen tiotropium (SPIRIVA) 18 MCG inhalation capsule Place 18  mcg into inhaler and inhale daily.    . vitamin E 800 UNIT capsule Take 800 Units by mouth daily.      . bethanechol (URECHOLINE) 25 MG tablet Reported on 02/22/2016    . calcium-vitamin D (OSCAL WITH D) 500-200 MG-UNIT per tablet Take 1,000 tablets by mouth daily.    . chlorhexidine (PERIDEX) 0.12 % solution     . Cyanocobalamin (VITAMIN B-12) 5000 MCG TBDP Take 1 tablet by mouth daily.    Marland Kitchen esomeprazole (NEXIUM) 40 MG capsule Take 40 mg by mouth daily as needed.      . Fluticasone-Salmeterol (ADVAIR HFA IN) Inhale into the lungs. Reported on 02/22/2016    . HYDROcodone-acetaminophen (LORTAB) 10-500 MG per tablet     . L-ARGININE PO Take by mouth daily. Reported on 02/22/2016    .  oxycodone (OXY-IR) 5 MG capsule Take 5 mg by mouth every 4 (four) hours as needed. Reported on 02/22/2016    . Phenytoin (DILANTIN PO) Take 100 mg by mouth 3 (three) times daily. 2 cap     . predniSONE (DELTASONE) 10 MG tablet Take 10 mg by mouth daily as needed.       No current facility-administered medications for this visit.     REVIEW OF SYSTEMS:  [X]  denotes positive finding, [ ]  denotes negative finding Cardiac  Comments:  Chest pain or chest pressure:    Shortness of breath upon exertion: X   Short of breath when lying flat: X   Irregular heart rhythm:        Vascular    Pain in calf, thigh, or hip brought on by ambulation:    Pain in feet at night that wakes you up from your sleep:     Blood clot in your veins:    Leg swelling:         Pulmonary    Oxygen at home: X prn  Productive cough:  X   Wheezing:  X       Neurologic    Sudden weakness in arms or legs:     Sudden numbness in arms or legs:     Sudden onset of difficulty speaking or slurred speech:    Temporary loss of vision in one eye:     Problems with dizziness:         Gastrointestinal    Blood in stool:     Vomited blood:         Genitourinary    Burning when urinating:     Blood in urine:        Psychiatric    Major depression:         Hematologic    Bleeding problems:    Problems with blood clotting too easily:        Skin    Rashes or ulcers:        Constitutional    Fever or chills:      PHYSICAL EXAM: Vitals:   06/15/16 1034 06/15/16 1036  BP: 129/80 128/76  Pulse: 67   Resp: 16   Temp: 98.5 F (36.9 C)   TempSrc: Oral   SpO2: 98%   Weight: 115 lb (52.2 kg)   Height: 5\' 2"  (1.575 m)     GENERAL: The patient is a well-nourished female, in no acute distress. The vital signs are documented above. CARDIAC: There is a regular rate and rhythm.  VASCULAR: She has a right carotid bruit. PULMONARY: There is good air exchange bilaterally without wheezing or  rales. ABDOMEN: Soft and  non-tender with normal pitched bowel sounds.  MUSCULOSKELETAL: There are no major deformities or cyanosis. NEUROLOGIC: No focal weakness or paresthesias are detected. SKIN: There are no ulcers or rashes noted. PSYCHIATRIC: The patient has a normal affect.  DATA:   CT ANGIOGRAM: I have reviewed the CT angiogram of the neck which was performed on 06/08/2016. This suggested a heavily calcified plaque in the right carotid. The stenosis was estimated at 80%.  As above, her carotid duplex scan suggests an 80% right carotid stenosis.  MEDICAL ISSUES:  ASYMPTOMATIC 80% RIGHT CAROTID STENOSIS: This patient has an 80% right carotid stenosis. She may be having some amaurosis fugax although the history is not clear. Regardless, given the severity of the stenosis, I have recommended right carotid endarterectomy in order to lower her risk of future stroke. By CT scan, although the stenosis extends fairly high, it does appear to be surgically accessible. I have reviewed the indications for carotid endarterectomy, that is to lower the risk of future stroke. I have also reviewed the potential complications of surgery, including but not limited to: bleeding, stroke (perioperative risk 1-2%), MI, nerve injury of other unpredictable medical problems. All of the patients questions were answered and they are agreeable to proceed with surgery.   Given her occasional chest pain and history of previous ablation, she will need preoperative cardiac clearance and we will arrange follow up with Dr. Bettina Gavia. Once this workup is complete we can schedule her for right carotid endarterectomy. She is on aspirin. She is not tolerant of statins.  Deitra Mayo [facility] [phone]

## 2016-06-16 ENCOUNTER — Other Ambulatory Visit: Payer: Self-pay

## 2016-06-16 ENCOUNTER — Telehealth: Payer: Self-pay | Admitting: Vascular Surgery

## 2016-06-16 NOTE — Telephone Encounter (Signed)
I spoke with the patient to give her the appointment information regarding the pre-op clearance Dr.Dickson requested yesterday with [facility] for 06/17/16 at 8am. She voiced understanding and stated she would keep this appt. awt

## 2016-06-24 ENCOUNTER — Encounter: Payer: Self-pay | Admitting: Cardiology

## 2016-06-30 ENCOUNTER — Encounter (HOSPITAL_COMMUNITY): Payer: Self-pay

## 2016-06-30 ENCOUNTER — Encounter (HOSPITAL_COMMUNITY)
Admission: RE | Admit: 2016-06-30 | Discharge: 2016-06-30 | Disposition: A | Discharge status: Home / Self Care | Payer: Commercial Managed Care - HMO | Source: Ambulatory Visit | Attending: Vascular Surgery | Admitting: Vascular Surgery

## 2016-06-30 DIAGNOSIS — I509 Heart failure, unspecified: Secondary | ICD-10-CM | POA: Insufficient documentation

## 2016-06-30 DIAGNOSIS — Z01812 Encounter for preprocedural laboratory examination: Secondary | ICD-10-CM | POA: Insufficient documentation

## 2016-06-30 DIAGNOSIS — K219 Gastro-esophageal reflux disease without esophagitis: Secondary | ICD-10-CM | POA: Insufficient documentation

## 2016-06-30 DIAGNOSIS — I2510 Atherosclerotic heart disease of native coronary artery without angina pectoris: Secondary | ICD-10-CM | POA: Insufficient documentation

## 2016-06-30 DIAGNOSIS — E785 Hyperlipidemia, unspecified: Secondary | ICD-10-CM | POA: Diagnosis not present

## 2016-06-30 DIAGNOSIS — I6521 Occlusion and stenosis of right carotid artery: Secondary | ICD-10-CM | POA: Diagnosis not present

## 2016-06-30 DIAGNOSIS — Z7952 Long term (current) use of systemic steroids: Secondary | ICD-10-CM | POA: Insufficient documentation

## 2016-06-30 DIAGNOSIS — Z7982 Long term (current) use of aspirin: Secondary | ICD-10-CM | POA: Diagnosis not present

## 2016-06-30 DIAGNOSIS — Z79899 Other long term (current) drug therapy: Secondary | ICD-10-CM | POA: Diagnosis not present

## 2016-06-30 DIAGNOSIS — F17200 Nicotine dependence, unspecified, uncomplicated: Secondary | ICD-10-CM | POA: Insufficient documentation

## 2016-06-30 DIAGNOSIS — Z0183 Encounter for blood typing: Secondary | ICD-10-CM | POA: Insufficient documentation

## 2016-06-30 DIAGNOSIS — Z01818 Encounter for other preprocedural examination: Secondary | ICD-10-CM | POA: Diagnosis present

## 2016-06-30 DIAGNOSIS — J449 Chronic obstructive pulmonary disease, unspecified: Secondary | ICD-10-CM | POA: Insufficient documentation

## 2016-06-30 HISTORY — DX: Other supraventricular tachycardia: I47.19

## 2016-06-30 HISTORY — DX: Gastro-esophageal reflux disease without esophagitis: K21.9

## 2016-06-30 HISTORY — DX: Unspecified cataract: H26.9

## 2016-06-30 HISTORY — DX: Presence of spectacles and contact lenses: Z97.3

## 2016-06-30 HISTORY — DX: Other specified postprocedural states: Z98.890

## 2016-06-30 HISTORY — DX: Unspecified convulsions: R56.9

## 2016-06-30 HISTORY — DX: Presence of external hearing-aid: Z97.4

## 2016-06-30 HISTORY — DX: Supraventricular tachycardia: I47.1

## 2016-06-30 HISTORY — DX: Malignant (primary) neoplasm, unspecified: C80.1

## 2016-06-30 HISTORY — DX: Rheumatic fever without heart involvement: I00

## 2016-06-30 HISTORY — DX: Unspecified hearing loss, unspecified ear: H91.90

## 2016-06-30 HISTORY — DX: Headache, unspecified: R51.9

## 2016-06-30 HISTORY — DX: Headache: R51

## 2016-06-30 HISTORY — DX: Reserved for inherently not codable concepts without codable children: IMO0001

## 2016-06-30 HISTORY — DX: Idiopathic sleep related nonobstructive alveolar hypoventilation: G47.34

## 2016-06-30 HISTORY — DX: Other specified postprocedural states: R11.2

## 2016-06-30 LAB — URINALYSIS, ROUTINE W REFLEX MICROSCOPIC
BILIRUBIN URINE: NEGATIVE
Glucose, UA: NEGATIVE mg/dL
HGB URINE DIPSTICK: NEGATIVE
KETONES UR: NEGATIVE mg/dL
Nitrite: NEGATIVE
PROTEIN: NEGATIVE mg/dL
Specific Gravity, Urine: 1.011 (ref 1.005–1.030)
pH: 6.5 (ref 5.0–8.0)

## 2016-06-30 LAB — URINE MICROSCOPIC-ADD ON: RBC / HPF: NONE SEEN RBC/hpf (ref 0–5)

## 2016-06-30 LAB — COMPREHENSIVE METABOLIC PANEL
ALBUMIN: 4 g/dL (ref 3.5–5.0)
ALK PHOS: 48 U/L (ref 38–126)
ALT: 22 U/L (ref 14–54)
ANION GAP: 10 (ref 5–15)
AST: 28 U/L (ref 15–41)
BUN: 8 mg/dL (ref 6–20)
CO2: 27 mmol/L (ref 22–32)
Calcium: 9.6 mg/dL (ref 8.9–10.3)
Chloride: 103 mmol/L (ref 101–111)
Creatinine, Ser: 0.77 mg/dL (ref 0.44–1.00)
GFR calc Af Amer: >60 mL/min (ref >60)
GFR calc non Af Amer: >60 mL/min (ref >60)
GLUCOSE: 94 mg/dL (ref 65–99)
POTASSIUM: 4.3 mmol/L (ref 3.5–5.1)
SODIUM: 140 mmol/L (ref 135–145)
Total Bilirubin: 0.7 mg/dL (ref 0.3–1.2)
Total Protein: 6.8 g/dL (ref 6.5–8.1)

## 2016-06-30 LAB — CBC
HCT: 45.9 % (ref 36.0–46.0)
HEMOGLOBIN: 15.3 g/dL — AB (ref 12.0–15.0)
MCH: 30.2 pg (ref 26.0–34.0)
MCHC: 33.3 g/dL (ref 30.0–36.0)
MCV: 90.5 fL (ref 78.0–100.0)
Platelets: 256 10*3/uL (ref 150–400)
RBC: 5.07 MIL/uL (ref 3.87–5.11)
RDW: 13.6 % (ref 11.5–15.5)
WBC: 9.8 10*3/uL (ref 4.0–10.5)

## 2016-06-30 LAB — SURGICAL PCR SCREEN
MRSA, PCR: NEGATIVE
STAPHYLOCOCCUS AUREUS: NEGATIVE

## 2016-06-30 LAB — TYPE AND SCREEN
ABO/RH(D): O POS
ANTIBODY SCREEN: NEGATIVE

## 2016-06-30 LAB — PROTIME-INR
INR: 0.93
Prothrombin Time: 12.5 seconds (ref 11.4–15.2)

## 2016-06-30 LAB — APTT: APTT: 30 s (ref 24–36)

## 2016-06-30 NOTE — Progress Notes (Signed)
Pt stated that she has a history of Bronchitis and is planning to follow up with PCP, Dr. Nelda Bucks, regarding symptoms; pt advised to notify surgeon if symptoms don't improve. Pt stated that she is under the care of Dr. Bettina Gavia, Cardiology,  but saw Dr. Agustin Cree for a stress test and cardiac clearance; records requested ( EKG tracing, stress, echo, clearance and LOV notes. Pt denies having a cardiac cath. Pt denies having a chest x ray within the last year. Pt denies having any recent labs. Pt chart forwarded to anesthesia to review cardiac history.

## 2016-06-30 NOTE — Pre-Procedure Instructions (Signed)
[name]  06/30/2016      [facility] [address] [place] [id number] [place] [id number] Phone: [phone] Fax: [phone]    Your procedure is scheduled on Friday, July 08, 2016  Report to [facility] Admitting at 5:30 A.M.  Call this number if you have problems the morning of surgery:  [phone]   Remember:  Do not eat food or drink liquids after midnight Thursday, July 07, 2016  Take these medicines the morning of surgery with A SIP OF WATER : Aspirin, Spiriva, if needed: Albuterol inhaler ( bring inhaler in with you on day of surgery) Stop taking vitamins, fish oil, and herbal medications. Do not take any NSAIDs ie: Ibuprofen, Advil, Naproxen,   (Anaprox) BC and Goody Powder  Do not wear jewelry, make-up or nail polish.  Do not wear lotions, powders, or perfumes, or deoderant.  Do not shave 48 hours prior to surgery.    Do not bring valuables to the hospital.  [facility] is not responsible for any belongings or valuables.  Contacts, dentures or bridgework may not be worn into surgery.  Leave your suitcase in the car.  After surgery it may be brought to your room.  For patients admitted to the hospital, discharge time will be determined by your treatment team.  Patients discharged the day of surgery will not be allowed to drive home.   Name and phone number of your driver:   Special instructions; [facility] - Preparing for Surgery  Before surgery, you can play an important role.  Because skin is not sterile, your skin needs to be as free of germs as possible.  You can reduce the number of germs on you skin by washing with CHG (chlorahexidine gluconate) soap before surgery.  CHG is an antiseptic cleaner which kills germs and bonds with the skin to continue killing germs even after washing.  Please DO NOT use if you have an allergy to CHG or antibacterial soaps.  If your skin becomes  reddened/irritated stop using the CHG and inform your nurse when you arrive at Short Stay.  Do not shave (including legs and underarms) for at least 48 hours prior to the first CHG shower.  You may shave your face.  Please follow these instructions carefully:   1.  Shower with CHG Soap the night before surgery and the morning of Surgery.  2.  If you choose to wash your hair, wash your hair first as usual with your normal shampoo.  3.  After you shampoo, rinse your hair and body thoroughly to remove the Shampoo.  4.  Use CHG as you would any other liquid soap.  You can apply chg directly  to the skin and wash gently with scrungie or a clean washcloth.  5.  Apply the CHG Soap to your body ONLY FROM THE NECK DOWN.  Do not use on open wounds or open sores.  Avoid contact with your eyes, ears, mouth and genitals (private parts).  Wash genitals (private parts) with your normal soap.  6.  Wash thoroughly, paying special attention to the area where your surgery will be performed.  7.  Thoroughly rinse your body with warm water from the neck down.  8.  DO NOT shower/wash with your normal soap after using and rinsing off the CHG Soap.  9.  Pat yourself dry with a clean towel.  10.  Wear clean pajamas.            11.  Place clean sheets on your bed the night of your first shower and do not sleep with pets.  Day of Surgery  Do not apply any lotions/deodorants the morning of surgery.  Please wear clean clothes to the hospital/surgery center.  Please read over the following fact sheets that you were given. Pain Booklet, Coughing and Deep Breathing, Blood Transfusion Information, MRSA Information and Surgical Site Infection Prevention

## 2016-06-30 NOTE — Progress Notes (Signed)
Spoke with Colletta Maryland, Surgical Scheduler, to make MD aware of abnormal UA.

## 2016-07-05 ENCOUNTER — Encounter (HOSPITAL_COMMUNITY): Payer: Self-pay

## 2016-07-05 ENCOUNTER — Other Ambulatory Visit: Payer: Self-pay | Admitting: *Deleted

## 2016-07-05 ENCOUNTER — Telehealth: Payer: Self-pay | Admitting: *Deleted

## 2016-07-05 DIAGNOSIS — R8290 Unspecified abnormal findings in urine: Secondary | ICD-10-CM

## 2016-07-05 MED ORDER — FLUCONAZOLE 200 MG PO TABS: 200.0000 mg | ORAL_TABLET | Freq: Every day | ORAL | 0 refills | Status: AC

## 2016-07-05 NOTE — Telephone Encounter (Signed)
Left message on patient's phone that this prescription had been faxed to [facility] in [place].

## 2016-07-05 NOTE — Telephone Encounter (Signed)
-----   Message from Gregery Na, RN sent at 07/05/2016  2:32 PM EDT ----- See below for Diflucan. Thanks! ----- Message ----- From: Angelia Mould, MD Sent: 07/05/2016  11:47 AM To: Gregery Na, RN  Diflucan 200 mg po daily for 7 days Thanks Gerald Stabs ----- Message ----- From: Gregery Na, RN Sent: 07/04/2016   4:48 PM To: Angelia Mould, MD  Received call from pre-op that Ms. Buxbaum has trace leukocytes and yeast in UA. Would we need to order something prior to (R) CEA this Friday?  Thanks, Colletta Maryland

## 2016-07-05 NOTE — Progress Notes (Signed)
Anesthesia Chart Review:  Pt is a 75 year old female scheduled for R CEA on 07/08/2016 with Deitra Mayo, MD.   - PCP is Nelda Bucks, MD - Cardiologist is Shirlee More, MD. Pt saw his colleague, Jenne Campus, MD, 06/17/16 for pre-op evaluation. His notes state pt could proceed with surgery if stress testing he ordered is normal (see below) - Sees Nani Skillern, PA with neurology (care everywhere) for hx seizures. Pt reports Ms. Rose stopped dilantin several months ago.   PMH includes:  CAD, CHF, PAT, hyperlipidemia, carotid artery occlusion (s/p L CEA), seizures/epilepsy, COPD, post-op N/V, GERD. Uses oxygen 2L at night prn. Current smoker. BMI 21  Medications include: albuterol, ASA, prednisone, spiriva  Preoperative labs reviewed.    EKG 03/28/16: sinus rhythm, frequent PACs. Old anterior infarct. Nonspecific T abnormality.   Nuclear stress test 06/21/16: No ischemia noted on this scan. Normal gated images. Normal EF  CT angio neck 06/08/16:  - Heavily calcified plaque in the right internal carotid artery narrowing the lumen by 80% diameter stenosis at the origin. Mild to moderate stenosis origin right external carotid artery. - Prior left carotid endarterectomy. Atherosclerotic calcification is present at the bifurcation without significant stenosis. - No significant vertebral artery stenosis.  Cardiac event monitor 04/21/16 (care everywhere): Unremarkable event monitor, asymptomatic patient. Only rare PAC's and PVC's noted.   Echo 03/31/16:  1. LV cavity normal in size. Normal global and regional wall motion. EF 60-65%. Grade I diastolic dysfunction.  2. Structurally normal tricuspid valve with mild regurgitation  If no changes, I anticipate pt can proceed with surgery as scheduled.   Willeen Cass, FNP-BC [facility]/Anesthesiology Phone: [phone] 07/05/2016 4:48 PM

## 2016-07-18 ENCOUNTER — Encounter (HOSPITAL_COMMUNITY): Payer: Self-pay | Admitting: *Deleted

## 2016-07-18 NOTE — Anesthesia Preprocedure Evaluation (Addendum)
Anesthesia Evaluation  Patient identified by MRN, date of birth, ID band Patient awake    Reviewed: Allergy & Precautions, NPO status , Patient's Chart, lab work & pertinent test results  History of Anesthesia Complications (+) PONV and history of anesthetic complications  Airway Mallampati: I  TM Distance: >3 FB Neck ROM: Full    Dental  (+) Edentulous Upper, Poor Dentition, Missing, Dental Advisory Given   Pulmonary COPD,  COPD inhaler, Current Smoker,    breath sounds clear to auscultation       Cardiovascular (-) angina+ Peripheral Vascular Disease   Rhythm:Regular Rate:Normal  8/17 Stress test: normal 6/17 EHO: normal   Neuro/Psych  Headaches, Seizures -, Well Controlled,  Anxiety    GI/Hepatic Neg liver ROS, GERD  Controlled,  Endo/Other  negative endocrine ROS  Renal/GU negative Renal ROS     Musculoskeletal  (+) Arthritis ,   Abdominal   Peds  Hematology negative hematology ROS (+)   Anesthesia Other Findings   Reproductive/Obstetrics                           Anesthesia Physical Anesthesia Plan  ASA: III  Anesthesia Plan: General   Post-op Pain Management:    Induction: Intravenous  Airway Management Planned: Oral ETT  Additional Equipment: Arterial line  Intra-op Plan:   Post-operative Plan: Extubation in OR  Informed Consent: I have reviewed the patients History and Physical, chart, labs and discussed the procedure including the risks, benefits and alternatives for the proposed anesthesia with the patient or authorized representative who has indicated his/her understanding and acceptance.   Dental advisory given  Plan Discussed with: Surgeon and CRNA  Anesthesia Plan Comments: (Plan routine monitors, A line, GETA)       Anesthesia Quick Evaluation

## 2016-07-19 ENCOUNTER — Inpatient Hospital Stay (HOSPITAL_COMMUNITY): Payer: Commercial Managed Care - HMO | Admitting: Anesthesiology

## 2016-07-19 ENCOUNTER — Inpatient Hospital Stay (HOSPITAL_COMMUNITY)
Admission: RE | Admit: 2016-07-19 | Discharge: 2016-07-20 | DRG: 038 | Disposition: A | Discharge status: Home / Self Care | Payer: Commercial Managed Care - HMO | Source: Ambulatory Visit | Attending: Vascular Surgery | Admitting: Vascular Surgery

## 2016-07-19 ENCOUNTER — Encounter (HOSPITAL_COMMUNITY): Payer: Self-pay | Admitting: Urology

## 2016-07-19 ENCOUNTER — Inpatient Hospital Stay (HOSPITAL_COMMUNITY): Payer: Commercial Managed Care - HMO | Admitting: Emergency Medicine

## 2016-07-19 ENCOUNTER — Encounter (HOSPITAL_COMMUNITY)
Admission: RE | Disposition: A | Discharge status: Home / Self Care | Payer: Self-pay | Source: Ambulatory Visit | Attending: Vascular Surgery

## 2016-07-19 DIAGNOSIS — J449 Chronic obstructive pulmonary disease, unspecified: Secondary | ICD-10-CM | POA: Diagnosis present

## 2016-07-19 DIAGNOSIS — K219 Gastro-esophageal reflux disease without esophagitis: Secondary | ICD-10-CM | POA: Diagnosis present

## 2016-07-19 DIAGNOSIS — G4730 Sleep apnea, unspecified: Secondary | ICD-10-CM | POA: Diagnosis present

## 2016-07-19 DIAGNOSIS — M1990 Unspecified osteoarthritis, unspecified site: Secondary | ICD-10-CM | POA: Diagnosis present

## 2016-07-19 DIAGNOSIS — I9581 Postprocedural hypotension: Secondary | ICD-10-CM

## 2016-07-19 DIAGNOSIS — F17200 Nicotine dependence, unspecified, uncomplicated: Secondary | ICD-10-CM | POA: Diagnosis present

## 2016-07-19 DIAGNOSIS — G40409 Other generalized epilepsy and epileptic syndromes, not intractable, without status epilepticus: Secondary | ICD-10-CM | POA: Diagnosis present

## 2016-07-19 DIAGNOSIS — Z809 Family history of malignant neoplasm, unspecified: Secondary | ICD-10-CM | POA: Diagnosis not present

## 2016-07-19 DIAGNOSIS — Z85828 Personal history of other malignant neoplasm of skin: Secondary | ICD-10-CM | POA: Diagnosis not present

## 2016-07-19 DIAGNOSIS — Z885 Allergy status to narcotic agent status: Secondary | ICD-10-CM

## 2016-07-19 DIAGNOSIS — Z79891 Long term (current) use of opiate analgesic: Secondary | ICD-10-CM

## 2016-07-19 DIAGNOSIS — I2510 Atherosclerotic heart disease of native coronary artery without angina pectoris: Secondary | ICD-10-CM | POA: Diagnosis present

## 2016-07-19 DIAGNOSIS — H9190 Unspecified hearing loss, unspecified ear: Secondary | ICD-10-CM | POA: Diagnosis present

## 2016-07-19 DIAGNOSIS — H539 Unspecified visual disturbance: Secondary | ICD-10-CM | POA: Diagnosis present

## 2016-07-19 DIAGNOSIS — I10 Essential (primary) hypertension: Secondary | ICD-10-CM | POA: Diagnosis present

## 2016-07-19 DIAGNOSIS — M810 Age-related osteoporosis without current pathological fracture: Secondary | ICD-10-CM | POA: Diagnosis present

## 2016-07-19 DIAGNOSIS — I4891 Unspecified atrial fibrillation: Secondary | ICD-10-CM | POA: Diagnosis not present

## 2016-07-19 DIAGNOSIS — E7800 Pure hypercholesterolemia, unspecified: Secondary | ICD-10-CM | POA: Diagnosis present

## 2016-07-19 DIAGNOSIS — R9431 Abnormal electrocardiogram [ECG] [EKG]: Secondary | ICD-10-CM | POA: Diagnosis not present

## 2016-07-19 DIAGNOSIS — Z8249 Family history of ischemic heart disease and other diseases of the circulatory system: Secondary | ICD-10-CM

## 2016-07-19 DIAGNOSIS — I471 Supraventricular tachycardia: Secondary | ICD-10-CM | POA: Diagnosis present

## 2016-07-19 DIAGNOSIS — Z7982 Long term (current) use of aspirin: Secondary | ICD-10-CM | POA: Diagnosis not present

## 2016-07-19 DIAGNOSIS — R110 Nausea: Secondary | ICD-10-CM | POA: Diagnosis not present

## 2016-07-19 DIAGNOSIS — R079 Chest pain, unspecified: Secondary | ICD-10-CM | POA: Diagnosis present

## 2016-07-19 DIAGNOSIS — F419 Anxiety disorder, unspecified: Secondary | ICD-10-CM | POA: Diagnosis present

## 2016-07-19 DIAGNOSIS — I6529 Occlusion and stenosis of unspecified carotid artery: Secondary | ICD-10-CM | POA: Diagnosis present

## 2016-07-19 DIAGNOSIS — I6521 Occlusion and stenosis of right carotid artery: Principal | ICD-10-CM | POA: Diagnosis present

## 2016-07-19 DIAGNOSIS — Z7952 Long term (current) use of systemic steroids: Secondary | ICD-10-CM

## 2016-07-19 DIAGNOSIS — Z7951 Long term (current) use of inhaled steroids: Secondary | ICD-10-CM

## 2016-07-19 DIAGNOSIS — Z79899 Other long term (current) drug therapy: Secondary | ICD-10-CM

## 2016-07-19 HISTORY — PX: ENDARTERECTOMY: SHX5162

## 2016-07-19 HISTORY — PX: PATCH ANGIOPLASTY: SHX6230

## 2016-07-19 LAB — CBC
HEMATOCRIT: 45 % (ref 36.0–46.0)
HEMOGLOBIN: 14.8 g/dL (ref 12.0–15.0)
MCH: 29.7 pg (ref 26.0–34.0)
MCHC: 32.9 g/dL (ref 30.0–36.0)
MCV: 90.4 fL (ref 78.0–100.0)
Platelets: 236 10*3/uL (ref 150–400)
RBC: 4.98 MIL/uL (ref 3.87–5.11)
RDW: 14.1 % (ref 11.5–15.5)
WBC: 10 10*3/uL (ref 4.0–10.5)

## 2016-07-19 LAB — COMPREHENSIVE METABOLIC PANEL
ALBUMIN: 3.6 g/dL (ref 3.5–5.0)
ALK PHOS: 43 U/L (ref 38–126)
ALT: 19 U/L (ref 14–54)
ANION GAP: 7 (ref 5–15)
AST: 22 U/L (ref 15–41)
BILIRUBIN TOTAL: 0.6 mg/dL (ref 0.3–1.2)
BUN: 8 mg/dL (ref 6–20)
CO2: 28 mmol/L (ref 22–32)
Calcium: 8.8 mg/dL — ABNORMAL LOW (ref 8.9–10.3)
Chloride: 104 mmol/L (ref 101–111)
Creatinine, Ser: 0.77 mg/dL (ref 0.44–1.00)
GFR calc Af Amer: >60 mL/min (ref >60)
GFR calc non Af Amer: >60 mL/min (ref >60)
GLUCOSE: 97 mg/dL (ref 65–99)
POTASSIUM: 3.9 mmol/L (ref 3.5–5.1)
SODIUM: 139 mmol/L (ref 135–145)
TOTAL PROTEIN: 5.9 g/dL — AB (ref 6.5–8.1)

## 2016-07-19 LAB — APTT: APTT: 28 s (ref 24–36)

## 2016-07-19 LAB — TYPE AND SCREEN
ABO/RH(D): O POS
ANTIBODY SCREEN: NEGATIVE

## 2016-07-19 LAB — PROTIME-INR
INR: 0.94
Prothrombin Time: 12.6 seconds (ref 11.4–15.2)

## 2016-07-19 SURGERY — ENDARTERECTOMY, CAROTID
Anesthesia: General | Site: Neck | Laterality: Right

## 2016-07-19 MED ORDER — PANTOPRAZOLE SODIUM 40 MG PO TBEC
40.0000 mg | DELAYED_RELEASE_TABLET | Freq: Every day | ORAL | Status: DC
  Administered 2016-07-20: 40 mg via ORAL
  Filled 2016-07-19: qty 1

## 2016-07-19 MED ORDER — CHLORHEXIDINE GLUCONATE CLOTH 2 % EX PADS: 6.0000 | MEDICATED_PAD | Freq: Once | CUTANEOUS | Status: DC

## 2016-07-19 MED ORDER — HEPARIN SODIUM (PORCINE) 1000 UNIT/ML IJ SOLN
INTRAMUSCULAR | Status: DC | PRN
  Administered 2016-07-19: 5000 [IU] via INTRAVENOUS
  Administered 2016-07-19: 2000 [IU] via INTRAVENOUS

## 2016-07-19 MED ORDER — TRAMADOL HCL 50 MG PO TABS
50.0000 mg | ORAL_TABLET | Freq: Four times a day (QID) | ORAL | Status: DC | PRN
  Administered 2016-07-19 – 2016-07-20 (×3): 50 mg via ORAL
  Filled 2016-07-19 (×3): qty 1

## 2016-07-19 MED ORDER — ACETAMINOPHEN 325 MG PO TABS
325.0000 mg | ORAL_TABLET | ORAL | Status: DC | PRN
  Administered 2016-07-19 – 2016-07-20 (×4): 650 mg via ORAL
  Filled 2016-07-19 (×4): qty 2

## 2016-07-19 MED ORDER — MEPERIDINE HCL 25 MG/ML IJ SOLN: 6.2500 mg | INTRAMUSCULAR | Status: DC | PRN

## 2016-07-19 MED ORDER — POTASSIUM CHLORIDE CRYS ER 20 MEQ PO TBCR: 20.0000 meq | EXTENDED_RELEASE_TABLET | Freq: Every day | ORAL | Status: DC | PRN

## 2016-07-19 MED ORDER — DEXTRAN 40 IN SALINE 10-0.9 % IV SOLN
INTRAVENOUS | Status: DC | PRN
  Administered 2016-07-19: 500 mL

## 2016-07-19 MED ORDER — SODIUM CHLORIDE 0.9 % IV SOLN
0.0125 ug/kg/min | INTRAVENOUS | Status: AC
  Administered 2016-07-19: .05 ug/kg/min via INTRAVENOUS
  Filled 2016-07-19: qty 2000

## 2016-07-19 MED ORDER — METOPROLOL TARTRATE 5 MG/5ML IV SOLN: 2.0000 mg | INTRAVENOUS | Status: DC | PRN

## 2016-07-19 MED ORDER — LIDOCAINE HCL (CARDIAC) 20 MG/ML IV SOLN
INTRAVENOUS | Status: DC | PRN
  Administered 2016-07-19: 20 mg via INTRAVENOUS

## 2016-07-19 MED ORDER — LABETALOL HCL 5 MG/ML IV SOLN
INTRAVENOUS | Status: DC | PRN
  Administered 2016-07-19: 10 mg via INTRAVENOUS

## 2016-07-19 MED ORDER — GUAIFENESIN-DM 100-10 MG/5ML PO SYRP: 15.0000 mL | ORAL_SOLUTION | ORAL | Status: DC | PRN

## 2016-07-19 MED ORDER — SODIUM CHLORIDE 0.9 % IV SOLN
INTRAVENOUS | Status: DC
  Administered 2016-07-19 – 2016-07-20 (×2): via INTRAVENOUS

## 2016-07-19 MED ORDER — FENTANYL CITRATE (PF) 100 MCG/2ML IJ SOLN
INTRAMUSCULAR | Status: DC | PRN
  Administered 2016-07-19: 25 ug via INTRAVENOUS

## 2016-07-19 MED ORDER — ONDANSETRON HCL 4 MG/2ML IJ SOLN
4.0000 mg | Freq: Four times a day (QID) | INTRAMUSCULAR | Status: DC | PRN
  Administered 2016-07-19 – 2016-07-20 (×2): 4 mg via INTRAVENOUS
  Filled 2016-07-19 (×2): qty 2

## 2016-07-19 MED ORDER — PHENYLEPHRINE HCL 10 MG/ML IJ SOLN
INTRAMUSCULAR | Status: DC | PRN
  Administered 2016-07-19: 80 ug via INTRAVENOUS

## 2016-07-19 MED ORDER — LIDOCAINE-EPINEPHRINE (PF) 1 %-1:200000 IJ SOLN
INTRAMUSCULAR | Status: AC
  Filled 2016-07-19: qty 30

## 2016-07-19 MED ORDER — DOPAMINE-DEXTROSE 3.2-5 MG/ML-% IV SOLN
INTRAVENOUS | Status: AC
  Administered 2016-07-19: 2 ug/kg/min via INTRAVENOUS
  Filled 2016-07-19: qty 250

## 2016-07-19 MED ORDER — PROTAMINE SULFATE 10 MG/ML IV SOLN
INTRAVENOUS | Status: DC | PRN
  Administered 2016-07-19: 40 mg via INTRAVENOUS

## 2016-07-19 MED ORDER — NEOSTIGMINE METHYLSULFATE 10 MG/10ML IV SOLN
INTRAVENOUS | Status: DC | PRN
  Administered 2016-07-19: 3 mg via INTRAVENOUS

## 2016-07-19 MED ORDER — HYDRALAZINE HCL 20 MG/ML IJ SOLN: 5.0000 mg | INTRAMUSCULAR | Status: DC | PRN

## 2016-07-19 MED ORDER — ACETAMINOPHEN 325 MG RE SUPP: 325.0000 mg | RECTAL | Status: DC | PRN

## 2016-07-19 MED ORDER — LIDOCAINE HCL (PF) 1 % IJ SOLN
INTRAMUSCULAR | Status: AC
  Filled 2016-07-19: qty 30

## 2016-07-19 MED ORDER — FENTANYL CITRATE (PF) 100 MCG/2ML IJ SOLN
INTRAMUSCULAR | Status: AC
  Administered 2016-07-19: 50 ug via INTRAVENOUS
  Filled 2016-07-19: qty 2

## 2016-07-19 MED ORDER — PROMETHAZINE HCL 25 MG/ML IJ SOLN: 6.2500 mg | INTRAMUSCULAR | Status: DC | PRN

## 2016-07-19 MED ORDER — DEXTRAN 40 IN SALINE 10-0.9 % IV SOLN
INTRAVENOUS | Status: AC
  Filled 2016-07-19: qty 500

## 2016-07-19 MED ORDER — SODIUM CHLORIDE 0.9 % IV SOLN
INTRAVENOUS | Status: DC | PRN
  Administered 2016-07-19: 08:00:00

## 2016-07-19 MED ORDER — DEXTROSE 5 % IV SOLN
1.5000 g | INTRAVENOUS | Status: AC
  Administered 2016-07-19: 1.5 g via INTRAVENOUS
  Filled 2016-07-19: qty 1.5

## 2016-07-19 MED ORDER — ALUM & MAG HYDROXIDE-SIMETH 200-200-20 MG/5ML PO SUSP
15.0000 mL | ORAL | Status: DC | PRN
  Administered 2016-07-19: 30 mL via ORAL
  Filled 2016-07-19: qty 30

## 2016-07-19 MED ORDER — 0.9 % SODIUM CHLORIDE (POUR BTL) OPTIME
TOPICAL | Status: DC | PRN
  Administered 2016-07-19: 2000 mL

## 2016-07-19 MED ORDER — FENTANYL CITRATE (PF) 100 MCG/2ML IJ SOLN
INTRAMUSCULAR | Status: AC
  Filled 2016-07-19: qty 2

## 2016-07-19 MED ORDER — MAGNESIUM SULFATE 2 GM/50ML IV SOLN: 2.0000 g | Freq: Every day | INTRAVENOUS | Status: DC | PRN

## 2016-07-19 MED ORDER — SENNOSIDES-DOCUSATE SODIUM 8.6-50 MG PO TABS: 1.0000 | ORAL_TABLET | Freq: Every evening | ORAL | Status: DC | PRN

## 2016-07-19 MED ORDER — DOPAMINE-DEXTROSE 3.2-5 MG/ML-% IV SOLN
0.0000 ug/kg/min | INTRAVENOUS | Status: DC
  Administered 2016-07-19: 5 ug/kg/min via INTRAVENOUS
  Administered 2016-07-19: 2 ug/kg/min via INTRAVENOUS
  Filled 2016-07-19: qty 250

## 2016-07-19 MED ORDER — DEXTROSE 5 % IV SOLN
1.5000 g | Freq: Two times a day (BID) | INTRAVENOUS | Status: AC
  Administered 2016-07-19 – 2016-07-20 (×2): 1.5 g via INTRAVENOUS
  Filled 2016-07-19 (×2): qty 1.5

## 2016-07-19 MED ORDER — LIDOCAINE HCL 4 % EX SOLN
CUTANEOUS | Status: DC | PRN
  Administered 2016-07-19: 4 mL via TOPICAL

## 2016-07-19 MED ORDER — ONDANSETRON HCL 4 MG/2ML IJ SOLN
INTRAMUSCULAR | Status: DC | PRN
  Administered 2016-07-19: 4 mg via INTRAVENOUS

## 2016-07-19 MED ORDER — EPHEDRINE SULFATE 50 MG/ML IJ SOLN
INTRAMUSCULAR | Status: DC | PRN
  Administered 2016-07-19: 10 mg via INTRAVENOUS

## 2016-07-19 MED ORDER — PREDNISONE 10 MG PO TABS: 10.0000 mg | ORAL_TABLET | Freq: Every day | ORAL | Status: DC | PRN

## 2016-07-19 MED ORDER — TIOTROPIUM BROMIDE MONOHYDRATE 18 MCG IN CAPS
18.0000 ug | ORAL_CAPSULE | Freq: Every day | RESPIRATORY_TRACT | Status: DC
Start: 2016-07-19 — End: 2016-07-20
  Administered 2016-07-19 – 2016-07-20 (×2): 18 ug via RESPIRATORY_TRACT
  Filled 2016-07-19: qty 5

## 2016-07-19 MED ORDER — ENOXAPARIN SODIUM 40 MG/0.4ML ~~LOC~~ SOLN
40.0000 mg | SUBCUTANEOUS | Status: DC
  Administered 2016-07-20: 40 mg via SUBCUTANEOUS
  Filled 2016-07-19: qty 0.4

## 2016-07-19 MED ORDER — SODIUM CHLORIDE 0.9 % IV SOLN
500.0000 mL | Freq: Once | INTRAVENOUS | Status: AC | PRN
  Administered 2016-07-19: 500 mL via INTRAVENOUS

## 2016-07-19 MED ORDER — PHENYLEPHRINE HCL 10 MG/ML IJ SOLN
INTRAVENOUS | Status: DC | PRN
  Administered 2016-07-19: 30 ug/min via INTRAVENOUS

## 2016-07-19 MED ORDER — SODIUM CHLORIDE 0.9 % IV SOLN
INTRAVENOUS | Status: DC
  Administered 2016-07-19: 500 mL via INTRAVENOUS

## 2016-07-19 MED ORDER — MIDAZOLAM HCL 2 MG/2ML IJ SOLN: 0.5000 mg | Freq: Once | INTRAMUSCULAR | Status: DC | PRN

## 2016-07-19 MED ORDER — ASPIRIN EC 325 MG PO TBEC
325.0000 mg | DELAYED_RELEASE_TABLET | Freq: Every day | ORAL | Status: DC
  Administered 2016-07-20: 325 mg via ORAL
  Filled 2016-07-19: qty 1

## 2016-07-19 MED ORDER — FENTANYL CITRATE (PF) 100 MCG/2ML IJ SOLN
25.0000 ug | INTRAMUSCULAR | Status: DC | PRN
  Administered 2016-07-19: 25 ug via INTRAVENOUS
  Administered 2016-07-19: 50 ug via INTRAVENOUS

## 2016-07-19 MED ORDER — DOCUSATE SODIUM 100 MG PO CAPS
100.0000 mg | ORAL_CAPSULE | Freq: Every day | ORAL | Status: DC
  Administered 2016-07-20: 100 mg via ORAL
  Filled 2016-07-19: qty 1

## 2016-07-19 MED ORDER — GLYCOPYRROLATE 0.2 MG/ML IJ SOLN
INTRAMUSCULAR | Status: DC | PRN
  Administered 2016-07-19: 0.4 mg via INTRAVENOUS

## 2016-07-19 MED ORDER — ASPIRIN 81 MG PO TABS: 81.0000 mg | ORAL_TABLET | Freq: Two times a day (BID) | ORAL | Status: DC

## 2016-07-19 MED ORDER — ALENDRONATE SODIUM 70 MG PO TABS: 70.0000 mg | ORAL_TABLET | ORAL | Status: DC

## 2016-07-19 MED ORDER — ALBUTEROL SULFATE (2.5 MG/3ML) 0.083% IN NEBU: 2.5000 mg | INHALATION_SOLUTION | Freq: Four times a day (QID) | RESPIRATORY_TRACT | Status: DC | PRN

## 2016-07-19 MED ORDER — PROPOFOL 10 MG/ML IV BOLUS
INTRAVENOUS | Status: DC | PRN
  Administered 2016-07-19: 30 mg via INTRAVENOUS
  Administered 2016-07-19: 90 mg via INTRAVENOUS

## 2016-07-19 MED ORDER — PHENOL 1.4 % MT LIQD: 1.0000 | OROMUCOSAL | Status: DC | PRN

## 2016-07-19 MED ORDER — LABETALOL HCL 5 MG/ML IV SOLN: 10.0000 mg | INTRAVENOUS | Status: DC | PRN

## 2016-07-19 MED ORDER — LACTATED RINGERS IV SOLN
INTRAVENOUS | Status: DC | PRN
  Administered 2016-07-19 (×2): via INTRAVENOUS

## 2016-07-19 MED ORDER — PROPOFOL 10 MG/ML IV BOLUS
INTRAVENOUS | Status: AC
  Filled 2016-07-19: qty 20

## 2016-07-19 MED ORDER — LIDOCAINE-EPINEPHRINE (PF) 1 %-1:200000 IJ SOLN
INTRAMUSCULAR | Status: DC | PRN
  Administered 2016-07-19: 30 mL

## 2016-07-19 MED ORDER — ROCURONIUM BROMIDE 100 MG/10ML IV SOLN
INTRAVENOUS | Status: DC | PRN
  Administered 2016-07-19: 50 mg via INTRAVENOUS

## 2016-07-19 SURGICAL SUPPLY — 47 items
BAG DECANTER FOR FLEXI CONT (MISCELLANEOUS) ×2 IMPLANT
CANISTER SUCTION 2500CC (MISCELLANEOUS) ×2 IMPLANT
CANNULA VESSEL 3MM 2 BLNT TIP (CANNULA) ×6 IMPLANT
CATH ROBINSON RED A/P 18FR (CATHETERS) ×2 IMPLANT
CATH SUCT 10FR WHISTLE TIP (CATHETERS) ×2 IMPLANT
CLIP TI MEDIUM 24 (CLIP) ×2 IMPLANT
CLIP TI WIDE RED SMALL 24 (CLIP) ×2 IMPLANT
CRADLE DONUT ADULT HEAD (MISCELLANEOUS) ×2 IMPLANT
DRAIN CHANNEL 15F RND FF W/TCR (WOUND CARE) IMPLANT
ELECT REM PT RETURN 9FT ADLT (ELECTROSURGICAL) ×2
ELECTRODE REM PT RTRN 9FT ADLT (ELECTROSURGICAL) ×1 IMPLANT
EVACUATOR SILICONE 100CC (DRAIN) IMPLANT
GLOVE BIO SURGEON STRL SZ 6.5 (GLOVE) ×6 IMPLANT
GLOVE BIO SURGEON STRL SZ7 (GLOVE) ×2 IMPLANT
GLOVE BIO SURGEON STRL SZ7.5 (GLOVE) ×2 IMPLANT
GLOVE BIOGEL PI IND STRL 6.5 (GLOVE) ×2 IMPLANT
GLOVE BIOGEL PI IND STRL 7.0 (GLOVE) ×1 IMPLANT
GLOVE BIOGEL PI IND STRL 7.5 (GLOVE) ×2 IMPLANT
GLOVE BIOGEL PI IND STRL 8 (GLOVE) ×1 IMPLANT
GLOVE BIOGEL PI INDICATOR 6.5 (GLOVE) ×2
GLOVE BIOGEL PI INDICATOR 7.0 (GLOVE) ×1
GLOVE BIOGEL PI INDICATOR 7.5 (GLOVE) ×2
GLOVE BIOGEL PI INDICATOR 8 (GLOVE) ×1
GLOVE ECLIPSE 7.0 STRL STRAW (GLOVE) ×2 IMPLANT
GOWN STRL REUS W/ TWL LRG LVL3 (GOWN DISPOSABLE) ×5 IMPLANT
GOWN STRL REUS W/TWL LRG LVL3 (GOWN DISPOSABLE) ×5
KIT BASIN OR (CUSTOM PROCEDURE TRAY) ×2 IMPLANT
KIT ROOM TURNOVER OR (KITS) ×2 IMPLANT
LIQUID BAND (GAUZE/BANDAGES/DRESSINGS) ×2 IMPLANT
NEEDLE HYPO 25X1 1.5 SAFETY (NEEDLE) ×2 IMPLANT
NS IRRIG 1000ML POUR BTL (IV SOLUTION) ×4 IMPLANT
PACK CAROTID (CUSTOM PROCEDURE TRAY) ×2 IMPLANT
PAD ARMBOARD 7.5X6 YLW CONV (MISCELLANEOUS) ×4 IMPLANT
PATCH HEMASHIELD 8X150 (Vascular Products) ×2 IMPLANT
SHUNT CAROTID BYPASS 10 (VASCULAR PRODUCTS) IMPLANT
SHUNT CAROTID BYPASS 12FRX15.5 (VASCULAR PRODUCTS) IMPLANT
SPONGE INTESTINAL PEANUT (DISPOSABLE) ×2 IMPLANT
SPONGE SURGIFOAM ABS GEL 100 (HEMOSTASIS) IMPLANT
SUT PROLENE 6 0 BV (SUTURE) ×6 IMPLANT
SUT PROLENE 7 0 BV 1 (SUTURE) IMPLANT
SUT SILK 2 0 FS (SUTURE) IMPLANT
SUT VIC AB 3-0 SH 27 (SUTURE) ×1
SUT VIC AB 3-0 SH 27X BRD (SUTURE) ×1 IMPLANT
SUT VICRYL 4-0 PS2 18IN ABS (SUTURE) ×2 IMPLANT
SYR 20CC LL (SYRINGE) ×2 IMPLANT
SYR CONTROL 10ML LL (SYRINGE) ×2 IMPLANT
WATER STERILE IRR 1000ML POUR (IV SOLUTION) ×2 IMPLANT

## 2016-07-19 NOTE — Progress Notes (Signed)
Patient doing well, no acute changes. States she is more sore than when she had a previous surgery. On receipt from PACU patient had some vomiting with no output. Zofran was given and she has not had any further episodes. Patient has dangled at the bedside. Vital signs are stable, pain controlled with tramadol PRN. No questions or concerns at this time.

## 2016-07-19 NOTE — Progress Notes (Signed)
   VASCULAR SURGERY POSTOP NOTE:  BP better on Dopamine.  Neuro intact.   Appreciate Cardiology's help.  SUBJECTIVE: Had some nausea earlier which has resolved.   PHYSICAL EXAM: Vitals:   07/19/16 1315 07/19/16 1330 07/19/16 1400 07/19/16 1500  BP: (!) 91/50  (!) 86/54 136/76  Pulse: 64 79 85 84  Resp: 17 18 18 18   Temp: 97.7 F (36.5 C)   97.7 F (36.5 C)  TempSrc:    Oral  SpO2: 96% 97% 98% 97%  Weight:       Neuro intact Incision fine.    Active Problems:   Carotid stenosis   [name] Beeper: [id number] 07/19/2016

## 2016-07-19 NOTE — Progress Notes (Signed)
Patient doing very well. She has been up to the bedside commode with assistance only to keep lines straight. Sat in chair for a short while and back to bed with little assistance.

## 2016-07-19 NOTE — Op Note (Signed)
    NAME: [name]   MRN: [id number] DOB: 1941/09/30    DATE OF OPERATION: 07/19/2016  PREOP DIAGNOSIS: Symptomatic right carotid stenosis  POSTOP DIAGNOSIS: Same  PROCEDURE: Right carotid endarterectomy with Dacron patch angioplasty  SURGEON: Judeth Cornfield. Scot Dock, MD, FACS  ASSIST: Dr. Adele Barthel, Gerri Lins PA  ANESTHESIA: Gen.   EBL: 100 cc  INDICATIONS: [name] is a 75 y.o. female who had been having some intermittent visual disturbances in the right eye. Her right carotid stenosis progressed to greater than 80%. She presented for right carotid endarterectomy in order to lower her risk of future stroke.  FINDINGS: The plaque extended very low into the common carotid artery. The stenosis was greater than 90%.  TECHNIQUE: The patient was taken to the operative room after an arterial line was placed by anesthesia. The patient received a general anesthetic. After careful positioning, the right neck was prepped and draped in the usual sterile fashion. An incision was made along the anterior border of the sternocleidomastoid and the dissection carried down to the common carotid artery which was dissected free and controlled with Rummel tourniquet. The plaque extended very low into the common carotid artery. The facial vein was divided between 2-0 silk ties. The internal carotid artery was controlled above the plaque with a vessel loop. The superior thyroid artery and external carotid arteries were controlled. The patient was heparinized. Clamps were then placed on the internal and the common and the external carotid artery. A longitudinal arteriotomy was made in the common carotid artery. This was extended to the plaque into the internal carotid artery. A 10 shunt was placed into the internal carotid artery, backbled and then placed into the common carotid artery and secured with Rummel tourniquet. Flow is reestablished the shunt. An endarterectomy plane was established  proximally. The plaque was sharply divided. Eversion endarterectomy was performed of the external carotid artery. Distally there was a nice taper in the plaque and no tacking sutures were required. The artery was irrigated with Some Heparin and Dextran Loose Debris Removed. Given the Length of the Endarterectomy Used a Long Dacron Patch.  The Dacron patch was sewn using a continuous 6-0 Prolene suture. Prior to completing the patch closure, the shunt was removed. The artery was backbled and flushed appropriately and the anastomosis completed. Flow was reestablished first to the external carotid artery and into the internal carotid artery. At the completion was a good Doppler signal distal to the patch in a good pulse. The heparin was partially reversed with protamine. The wound was irrigated with copious amounts of saline. Hemostasis was obtained in the wound. The wound was closed with a pair of 3-0 Vicryl. The platysma was closed with running 3-0 Vicryl. The skin was closed with 40 subcutaneous stitch. Sterile dressing was applied. The patient tolerated the procedure well and awoke neurologically intact. All needle and sponge counts were correct.  Deitra Mayo, MD, FACS [facility]  DATE OF DICTATION:   07/19/2016

## 2016-07-19 NOTE — Anesthesia Postprocedure Evaluation (Signed)
Anesthesia Post Note  Patient: [name]  Procedure(s) Performed: Procedure(s) (LRB): ENDARTERECTOMY RIGHT CAROTID ARTERY (Right) PATCH ANGIOPLASTY RIGHT CAROTID ARTERY USING HEMASHIELD PLATINUM FINESSE PATCH (Right)  Patient location during evaluation: PACU Anesthesia Type: General Level of consciousness: awake and alert, oriented and patient cooperative Pain management: pain level controlled Vital Signs Assessment: post-procedure vital signs reviewed and stable Respiratory status: spontaneous breathing, nonlabored ventilation, respiratory function stable and patient connected to nasal cannula oxygen Cardiovascular status: blood pressure returned to baseline and stable Postop Assessment: no signs of nausea or vomiting Anesthetic complications: no    Last Vitals:  Vitals:   07/19/16 1230 07/19/16 1236  BP:    Pulse: 73 (!) 45  Resp: 14 (!) 21  Temp:      Last Pain:  Vitals:   07/19/16 1145  TempSrc:   PainSc: 0-No pain                 Taggart Prasad,E. Kimbley Sprague

## 2016-07-19 NOTE — H&P (Signed)
Patient name: HAYLEE RIRIE        MRN: [id number]        DOB: 26-Dec-1940            Sex: female  REASON FOR ADMISSION: Right carotid disease.  HPI: DENORA INGHAM is a 75 y.o. female who I saw on 06/01/2016. The patient had a left carotid endarterectomy in 2012. I have been following A 60-79% right carotid stenosis. By history the patient did not appear to be symptomatic. Duplex scan of the time of her last visit showed a peak systolic velocity on the right of 314 cm/s and the end-diastolic velocity ranged from 94-1 44 cm/s. The study was somewhat difficult to interpret because of an irregular cardiac rhythm. In addition, the bifurcation on the right appeared somewhat high. For this reason I recommended a CT angiogram. She also been having some chest pain and therefore we arranged for her to see Dr. Bettina Gavia her cardiologist in anticipation of possible surgery. Of note her left carotid endarterectomy site was widely patent.  Patient is right-handed. She denies any history of stroke, TIAs, expressive or receptive aphasia. She does describe some occasional loss of vision although she's not sure what side this occurs on.  She describes some occasional chest pain. She has undergone previous ablation procedures in [facility].  She is on aspirin. She is not tolerant of statins.      Past Medical History:  Diagnosis Date  . Anxiety   . Arthritis   . Bronchitis   . CAD (coronary artery disease)   . Carotid artery occlusion   . CHF (congestive heart failure) ([place])   . Chronic airway obstruction, not elsewhere classified   . COPD (chronic obstructive pulmonary disease) ([place])   . Dysphagia, unspecified(787.20)   . Generalized convulsive epilepsy without mention of intractable epilepsy   . Hyperlipidemia   . Hypersomnia with sleep apnea, unspecified   . Osteoporosis   . Pure hypercholesterolemia   . Reflux   . Shortness of breath 03/11/2010   while laying still            Family History  Problem Relation Age of Onset  . Heart disease Mother     CHF  . Hyperlipidemia Mother   . Hypertension Mother   . Heart attack Mother   . Deep vein thrombosis Mother   . AAA (abdominal aortic aneurysm) Mother   . Heart disease Father   . Hyperlipidemia Father   . Hypertension Father   . Heart attack Father   . Cancer Sister   . Hyperlipidemia Sister   . Hypertension Sister   . Cancer Brother   . Hyperlipidemia Brother     SOCIAL HISTORY:       Social History  Substance Use Topics  . Smoking status: Current Every Day Smoker    Packs/day: 2.00    Years: 30.00    Types: Cigarettes  . Smokeless tobacco: Never Used     Comment: Down to 1.5 packs per day 06/01/2016  . Alcohol use No        Allergies  Allergen Reactions  . Codeine Shortness Of Breath          Current Outpatient Prescriptions  Medication Sig Dispense Refill  . Albuterol (PROVENTIL IN) Inhale into the lungs as needed.      Marland Kitchen alendronate (FOSAMAX) 70 MG tablet Take 70 mg by mouth every 7 (seven) days. Take with a full glass of water on an empty  stomach.     Marland Kitchen aspirin 81 MG tablet Take 81 mg by mouth 2 (two) times daily.      . Multiple Vitamin (MULTIVITAMIN WITH MINERALS) TABS tablet Take 1 tablet by mouth daily.    Marland Kitchen tiotropium (SPIRIVA) 18 MCG inhalation capsule Place 18 mcg into inhaler and inhale daily.    . vitamin E 800 UNIT capsule Take 800 Units by mouth daily.      . bethanechol (URECHOLINE) 25 MG tablet Reported on 02/22/2016    . calcium-vitamin D (OSCAL WITH D) 500-200 MG-UNIT per tablet Take 1,000 tablets by mouth daily.    . chlorhexidine (PERIDEX) 0.12 % solution     . Cyanocobalamin (VITAMIN B-12) 5000 MCG TBDP Take 1 tablet by mouth daily.    Marland Kitchen esomeprazole (NEXIUM) 40 MG capsule Take 40 mg by mouth daily as needed.      . Fluticasone-Salmeterol (ADVAIR HFA IN) Inhale into the lungs. Reported on 02/22/2016     . HYDROcodone-acetaminophen (LORTAB) 10-500 MG per tablet     . L-ARGININE PO Take by mouth daily. Reported on 02/22/2016    . oxycodone (OXY-IR) 5 MG capsule Take 5 mg by mouth every 4 (four) hours as needed. Reported on 02/22/2016    . Phenytoin (DILANTIN PO) Take 100 mg by mouth 3 (three) times daily. 2 cap     . predniSONE (DELTASONE) 10 MG tablet Take 10 mg by mouth daily as needed.       No current facility-administered medications for this visit.     REVIEW OF SYSTEMS:  [X]  denotes positive finding, [ ]  denotes negative finding Cardiac  Comments:  Chest pain or chest pressure:    Shortness of breath upon exertion: X   Short of breath when lying flat: X   Irregular heart rhythm:        Vascular    Pain in calf, thigh, or hip brought on by ambulation:    Pain in feet at night that wakes you up from your sleep:     Blood clot in your veins:    Leg swelling:         Pulmonary    Oxygen at home: X prn  Productive cough:  X   Wheezing:  X       Neurologic    Sudden weakness in arms or legs:     Sudden numbness in arms or legs:     Sudden onset of difficulty speaking or slurred speech:    Temporary loss of vision in one eye:     Problems with dizziness:         Gastrointestinal    Blood in stool:     Vomited blood:         Genitourinary    Burning when urinating:     Blood in urine:        Psychiatric    Major depression:         Hematologic    Bleeding problems:    Problems with blood clotting too easily:        Skin    Rashes or ulcers:        Constitutional    Fever or chills:      PHYSICAL EXAM:  Vitals:   07/19/16 0626  BP: (!) 125/57  Pulse: (!) 58  Resp: 18  Temp: 97.9 F (36.6 C)        Vitals:   06/15/16 1034 06/15/16 1036  BP: 129/80 128/76  Pulse: 67  Resp: 16   Temp: 98.5 F (36.9 C)   TempSrc: Oral   SpO2: 98%    Weight: 115 lb (52.2 kg)   Height: 5\' 2"  (1.575 m)     GENERAL: The patient is a well-nourished female, in no acute distress. The vital signs are documented above. CARDIAC: There is a regular rate and rhythm.  VASCULAR: She has a right carotid bruit. PULMONARY: There is good air exchange bilaterally without wheezing or rales. ABDOMEN: Soft and non-tender with normal pitched bowel sounds.  MUSCULOSKELETAL: There are no major deformities or cyanosis. NEUROLOGIC: No focal weakness or paresthesias are detected. SKIN: There are no ulcers or rashes noted. PSYCHIATRIC: The patient has a normal affect.  DATA:   CT ANGIOGRAM: I have reviewed the CT angiogram of the neck which was performed on 06/08/2016. This suggested a heavily calcified plaque in the right carotid. The stenosis was estimated at 80%.  As above, her carotid duplex scan suggests an 80% right carotid stenosis.  MEDICAL ISSUES:  ASYMPTOMATIC 80% RIGHT CAROTID STENOSIS: This patient has an 80% right carotid stenosis. She may be having some amaurosis fugax although the history is not clear. Regardless, given the severity of the stenosis, I have recommended right carotid endarterectomy in order to lower her risk of future stroke. By CT scan, although the stenosis extends fairly high, it does appear to be surgically accessible. I have reviewed the indications for carotid endarterectomy, that is to lower the risk of future stroke. I have also reviewed the potential complications of surgery, including but not limited to: bleeding, stroke (perioperative risk 1-2%), MI, nerve injury of other unpredictable medical problems. All of the patients questions were answered and they are agreeable to proceed with surgery.   Given her occasional chest pain and history of previous ablation, she will need preoperative cardiac clearance and we will arrange follow up with Dr. Bettina Gavia. Once this workup is complete we can schedule her for right  carotid endarterectomy. She is on aspirin. She is not tolerant of statins.  Deitra Mayo

## 2016-07-19 NOTE — Anesthesia Procedure Notes (Signed)
Procedure Name: Intubation Date/Time: 07/19/2016 8:19 AM Performed by: Manuela Schwartz B Pre-anesthesia Checklist: Patient identified, Emergency Drugs available, Suction available, Patient being monitored and Timeout performed Patient Re-evaluated:Patient Re-evaluated prior to inductionOxygen Delivery Method: Circle system utilized Preoxygenation: Pre-oxygenation with 100% oxygen Intubation Type: IV induction Ventilation: Mask ventilation without difficulty Laryngoscope Size: Mac and 3 Grade View: Grade I Tube type: Oral Tube size: 7.0 mm Number of attempts: 1 Airway Equipment and Method: Stylet and LTA kit utilized Placement Confirmation: ETT inserted through vocal cords under direct vision,  positive ETCO2 and breath sounds checked- equal and bilateral Secured at: 21 cm Tube secured with: Tape Dental Injury: Teeth and Oropharynx as per pre-operative assessment

## 2016-07-19 NOTE — Progress Notes (Signed)
        Post op hypotension bolus X 2 with systolic in the 80, HR 55.  Started on Dopamine IV in PACU.   Cardiology consult called in.  COLLINS, EMMA MAUREEN PA-C

## 2016-07-19 NOTE — Progress Notes (Signed)
PHARMACIST - PHYSICIAN COMMUNICATION  CONCERNING: P&T Medication Policy Regarding Oral Bisphosphonates  RECOMMENDATION: Your order for alendronate (Fosamax), ibandronate (Boniva), or risedronate (Actonel) has been discontinued at this time.  If the patient's post-hospital medical condition warrants safe use of this class of drugs, please resume the pre-hospital regimen upon discharge.  DESCRIPTION:  Alendronate (Fosamax), ibandronate (Boniva), and risedronate (Actonel) can cause severe esophageal erosions in patients who are unable to remain upright at least 30 minutes after taking this medication.   Since brief interruptions in therapy are thought to have minimal impact on bone mineral density, the Pharmacy & Therapeutics Committee has established that bisphosphonate orders should be routinely discontinued during hospitalization.   To override this safety policy and permit administration of Boniva, Fosamax, or Actonel in the hospital, prescribers must write "DO NOT HOLD" in the comments section when placing the order for this class of medications.    Geovanie Winnett, PharmD Clinical Pharmacist [facility]    

## 2016-07-19 NOTE — Care Management Note (Signed)
Case Management Note  Patient Details  Name: [name] MRN: [id number] Date of Birth: Nov 28, 1940  Subjective/Objective:  Patient s/p R CEA, NCM will cont to follow for dc needs.                   Action/Plan:   Expected Discharge Date:                  Expected Discharge Plan:  Home/Self Care  In-House Referral:     Discharge planning Services  CM Consult  Post Acute Care Choice:    Choice offered to:     DME Arranged:    DME Agency:     HH Arranged:    HH Agency:     Status of Service:  In process, will continue to follow  If discussed at Long Length of Stay Meetings, dates discussed:    Additional Comments:  Zenon Mayo, RN 07/19/2016, 6:52 PM

## 2016-07-19 NOTE — Consult Note (Signed)
Cardiology Consult    Patient ID: [name] MRN: [id number], DOB/AGE: 1940-12-28   Admit date: 07/19/2016 Date of Consult: 07/19/2016  Primary Physician: Nicoletta Dress, MD Reason for Consult: afib and hypotension Primary Cardiologist: New  Requesting Provider: Dr. Scot Dock   Patient Profile  [name] is a 75 year old female with a past medical history of carotid artery disease (s/p R CEA today), COPD, HTN, HLD, and paroxysmal atrial tachycardia. She had R CEA today and developed questionable Afib on the monitor and hypotension. Cardiology was consulted.   History of Present Illness  [name] follows Dr. Bettina Gavia in [place].She also has a history of SVT and has had multiple ablations. She last saw Dr. Bettina Gavia in August of this year month. Dr. Bettina Gavia noted that she had worn an event monitor after reporting palpitations in June of this year. Her monitor showed PAC's and paroxysmal atrial tachycardia. No SVT or atrial fibrillation.   She had a nuclear stress test prior to today's surgery for cardiac clearance, I cannot view the results in care everywhere, however assume there was no ischemia since she proceeded with surgery.   Today, following her surgery while in the PACU, she developed hypotension that was non responsive to 563ml bolus x 2. Dopamine was started with resolution of her hypotension.   Past Medical History   Past Medical History:  Diagnosis Date  . Anxiety   . Arthritis   . Bronchitis   . CAD (coronary artery disease)   . Cancer ([place])    skin cancer right arm  . Carotid artery occlusion   . Cataracts, bilateral   . CHF (congestive heart failure) ([place])   . Chronic airway obstruction, not elsewhere classified   . COPD (chronic obstructive pulmonary disease) ([place])   . Dysphagia, unspecified(787.20)   . Generalized convulsive epilepsy without mention of intractable epilepsy   . GERD (gastroesophageal reflux disease)   . Headache   . Hearing impaired    . Hyperlipidemia   . Hypersomnia with sleep apnea, unspecified   . Osteoporosis   . Oxygen desaturation during sleep    PRN;02 liters  . Paroxysmal atrial tachycardia ([place])   . PONV (postoperative nausea and vomiting)    " I had problems where my heart starts speeding and fluttering; it goes away by itself ."  . Pure hypercholesterolemia   . Reflux   . Rheumatic fever    as a child  . Seizures ([place])   . Shortness of breath 03/11/2010   while laying still  . Wears glasses   . Wears hearing aids     Past Surgical History:  Procedure Laterality Date  . ABDOMINAL HYSTERECTOMY    . ABLATION     x 3  . CAROTID ENDARTERECTOMY  11/11/2010   left with Dacron patch angioplasty  . CESAREAN SECTION     2 c-sections  . HERNIA REPAIR    . SKIN CANCER EXCISION    . TONSILLECTOMY AND ADENOIDECTOMY       Allergies  Allergies  Allergen Reactions  . Codeine Shortness Of Breath  . Contrast Media [Iodinated Diagnostic Agents] Rash    Itching and rash    Inpatient Medications    . Chlorhexidine Gluconate Cloth  6 each Topical Once   And  . Chlorhexidine Gluconate Cloth  6 each Topical Once  . DOPamine        Family History    Family History  Problem Relation Age of Onset  . Heart  disease Mother     CHF  . Hyperlipidemia Mother   . Hypertension Mother   . Heart attack Mother   . Deep vein thrombosis Mother   . AAA (abdominal aortic aneurysm) Mother   . Heart disease Father   . Hyperlipidemia Father   . Hypertension Father   . Heart attack Father   . Cancer Sister   . Hyperlipidemia Sister   . Hypertension Sister   . Cancer Brother   . Hyperlipidemia Brother     Social History    Social History   Social History  . Marital status: Married    Spouse name: N/A  . Number of children: N/A  . Years of education: N/A   Occupational History  . Not on file.   Social History Main Topics  . Smoking status: Current Every Day Smoker    Packs/day: 2.00    Years:  30.00    Types: Cigarettes  . Smokeless tobacco: Never Used     Comment: Down to 1.5 packs per day 06/30/16  . Alcohol use No  . Drug use: No  . Sexual activity: Not on file   Other Topics Concern  . Not on file   Social History Narrative  . No narrative on file     Review of Systems    General:  No chills, fever, night sweats or weight changes.  Cardiovascular:  No chest pain, +dyspnea on exertion, edema, orthopnea, no palpitations, paroxysmal nocturnal dyspnea. Dermatological: No rash, lesions/masses Respiratory: No cough, dyspnea Urologic: No hematuria, dysuria Abdominal:   No nausea, vomiting, diarrhea, bright red blood per rectum, melena, or hematemesis Neurologic:  No visual changes, wkns, changes in mental status. All other systems reviewed and are otherwise negative except as noted above.  Physical Exam    Blood pressure (!) 72/48, pulse (!) 53, temperature 98.6 F (37 C), resp. rate 13, weight 114 lb (51.7 kg), SpO2 95 %.  General: Pleasant, NAD, lethargic Psych: Normal affect. Neuro: Alert and oriented X 3. Moves all extremities spontaneously. HEENT: Normal  Neck: Supple, R CEA incision Lungs:  Resp regular and unlabored, diminished in all fields Heart: IRRR no s3, s4, or murmurs. Abdomen: Soft, non-tender, non-distended, BS + x 4.  Extremities: No clubbing, cyanosis or edema. DP/PT/Radials 2+ and equal bilaterally.  Labs     Lab Results  Component Value Date   WBC 10.0 07/19/2016   HGB 14.8 07/19/2016   HCT 45.0 07/19/2016   MCV 90.4 07/19/2016   PLT 236 07/19/2016    Recent Labs Lab 07/19/16 0703  NA 139  K 3.9  CL 104  CO2 28  BUN 8  CREATININE 0.77  CALCIUM 8.8*  PROT 5.9*  BILITOT 0.6  ALKPHOS 43  ALT 19  AST 22  GLUCOSE 97    Radiology Studies    No results found.  EKG & Cardiac Imaging    EKG: pending  Echocardiogram: pending   Assessment & Plan  1. Frequent PAC's and history of paroxsymal atrial tachycardia: Patient has  frequent PAC's. No afib or a flutter noted on telemetry.EKG is pending. She has recently wore an event monitor for 2 weeks that showed on PAC's and occasional PVC's. Her rate is well controlled,she is not on any AV nodal blocking agents at home.   2. Hypotension: Blood pressure improved to 104/57, was in the 74/40 range when I first arrived. Dopamine started at 109mcg, improved BP. Will order Echo to evaluate wall motion and EF.   3.  History of SVT s/p remote ablation: Stable.   4. COPD: Management per primary team.     Signed, Arbutus Leas, NP 07/19/2016, 12:00 PM Pager: [phone]

## 2016-07-19 NOTE — Transfer of Care (Signed)
Immediate Anesthesia Transfer of Care Note  Patient: [name]  Procedure(s) Performed: Procedure(s): ENDARTERECTOMY RIGHT CAROTID ARTERY (Right) PATCH ANGIOPLASTY RIGHT CAROTID ARTERY USING HEMASHIELD PLATINUM FINESSE PATCH (Right)  Patient Location: PACU  Anesthesia Type:General  Level of Consciousness: awake, alert  and oriented  Airway & Oxygen Therapy: Patient Spontanous Breathing  Post-op Assessment: Report given to RN and Post -op Vital signs reviewed and stable  Post vital signs: Reviewed and stable  Last Vitals:  Vitals:   07/19/16 0626 07/19/16 1045  BP: (!) 125/57   Pulse: (!) 58   Resp: 18   Temp: 36.6 C 37 C    Last Pain:  Vitals:   07/19/16 0626  TempSrc: Oral         Complications: No apparent anesthesia complications

## 2016-07-20 ENCOUNTER — Encounter (HOSPITAL_COMMUNITY): Payer: Self-pay | Admitting: Vascular Surgery

## 2016-07-20 ENCOUNTER — Telehealth: Payer: Self-pay | Admitting: Vascular Surgery

## 2016-07-20 ENCOUNTER — Inpatient Hospital Stay (HOSPITAL_COMMUNITY): Payer: Commercial Managed Care - HMO

## 2016-07-20 DIAGNOSIS — R9431 Abnormal electrocardiogram [ECG] [EKG]: Secondary | ICD-10-CM

## 2016-07-20 LAB — CBC
HEMATOCRIT: 38.9 % (ref 36.0–46.0)
HEMOGLOBIN: 12.9 g/dL (ref 12.0–15.0)
MCH: 29.8 pg (ref 26.0–34.0)
MCHC: 33.2 g/dL (ref 30.0–36.0)
MCV: 89.8 fL (ref 78.0–100.0)
Platelets: 208 10*3/uL (ref 150–400)
RBC: 4.33 MIL/uL (ref 3.87–5.11)
RDW: 14.3 % (ref 11.5–15.5)
WBC: 12.8 10*3/uL — AB (ref 4.0–10.5)

## 2016-07-20 LAB — BASIC METABOLIC PANEL
ANION GAP: 8 (ref 5–15)
BUN: 7 mg/dL (ref 6–20)
CALCIUM: 7.7 mg/dL — AB (ref 8.9–10.3)
CHLORIDE: 103 mmol/L (ref 101–111)
CO2: 26 mmol/L (ref 22–32)
Creatinine, Ser: 0.66 mg/dL (ref 0.44–1.00)
GFR calc non Af Amer: >60 mL/min (ref >60)
Glucose, Bld: 114 mg/dL — ABNORMAL HIGH (ref 65–99)
Potassium: 3.4 mmol/L — ABNORMAL LOW (ref 3.5–5.1)
SODIUM: 137 mmol/L (ref 135–145)

## 2016-07-20 LAB — ECHOCARDIOGRAM COMPLETE: WEIGHTICAEL: 1824 [oz_av]

## 2016-07-20 MED ORDER — TRAMADOL HCL 50 MG PO TABS: 50.0000 mg | ORAL_TABLET | Freq: Four times a day (QID) | ORAL | 0 refills | Status: DC | PRN

## 2016-07-20 NOTE — Progress Notes (Signed)
   VASCULAR SURGERY ASSESSMENT & PLAN:  1 Day Post-Op s/p: Right CEA. Neuro intact.   BP better. Off Dopamine.   Nausea last PM likely related to anesthesia.  Cardiac: Looks like A. Fib this AM. Rate is fine. Cardiology following. Dr. Bettina Gavia is her Cardiologist in [place].   Vascular Quality Initiative: She is on ASA. She does not tolerate statins.   Home later today if no nausea and if OK with Cardiology.  SUBJECTIVE: Nausea last PM. Otherwise no complaints this AM.  PHYSICAL EXAM: Vitals:   07/19/16 2336 07/19/16 2342 07/20/16 0354 07/20/16 0400  BP: 127/69  (!) 122/57   Pulse: 88  86   Resp: (!) 22  15   Temp:  98 F (36.7 C)  98.3 F (36.8 C)  TempSrc:  Oral  Oral  SpO2: 90%  96%   Weight:       Neuro Intact Incision looks fine.   Active Problems:   Carotid stenosis   Abnormal EKG   Postprocedural hypotension   Gae Gallop Beeper: [id number] 07/20/2016

## 2016-07-20 NOTE — Telephone Encounter (Signed)
-----   Message from Mena Goes, RN sent at 07/20/2016  3:07 PM EDT ----- Regarding: schedule   ----- Message ----- From: Ulyses Amor, PA-C Sent: 07/20/2016   1:51 PM To: Vvs Charge Pool  S/P carotid f/u with Dr. Scot Dock in 2 weeks

## 2016-07-20 NOTE — Progress Notes (Signed)
    Tele from today reviewed with Dr. Caryl Comes from [facility] - still feels that this is SR with PACs.  May have short spurts of PAT, but does not feel that this is Afib.  Echo reviewed - relatively normal   Would be OK to d/c from Cardiac standpoint - she sees Dr.Munley for Primary Cardiology.   Glenetta Hew, MD

## 2016-07-20 NOTE — Progress Notes (Signed)
Patient refusing PRN breathing treatment at this time for her wheezing. RN notified.

## 2016-07-20 NOTE — Progress Notes (Signed)
Patient to be discharged to home with husband. PIV removed. CCMT and Elink notified. Discharge instructions reviewed with patient. S/S of infection reivewed. S/S of stroke reviewed. Prescription given to patient. Follow up appts arranged prior to discharge. Family to provide transportation home.   Joellen Jersey, RN.

## 2016-07-20 NOTE — Progress Notes (Signed)
     Cardiology plan: We will follow-up the results of echocardiogram and monitor her telemetry. If normal echo and stable telemetry, will sign off.    Gianlucas Evenson MAUREEN PA-C

## 2016-07-20 NOTE — Telephone Encounter (Signed)
LVM on mobile for f/u appt w/Dr Scot Dock on 10/11 lttr mailed

## 2016-07-20 NOTE — Care Management Note (Signed)
Case Management Note  Patient Details  Name: [name] MRN: [id number] Date of Birth: 10-15-1941  Subjective/Objective:   Patient lives with spouse, she is s/p R CEA, she has home oxygen thru [place] but she does not use it all the time.  Per patient and spouse, she barely uses it.PTA indep.  She is for dc today, no needs.                    Action/Plan:   Expected Discharge Date:                  Expected Discharge Plan:  Home/Self Care  In-House Referral:     Discharge planning Services  CM Consult  Post Acute Care Choice:    Choice offered to:     DME Arranged:    DME Agency:     HH Arranged:    HH Agency:     Status of Service:  Completed, signed off  If discussed at [employer] of Stay Meetings, dates discussed:    Additional Comments:  Zenon Mayo, RN 07/20/2016, 2:25 PM

## 2016-07-20 NOTE — Progress Notes (Signed)
Echocardiogram 2D Echocardiogram has been performed.  [name] 07/20/2016, 11:10 AM

## 2016-07-20 NOTE — Plan of Care (Signed)
Problem: Health Behavior/Discharge Planning: Goal: Ability to manage health-related needs will improve Outcome: Completed/Met Date Met: 07/20/16 Follow up appts arranged prior to discharge.

## 2016-07-25 ENCOUNTER — Ambulatory Visit (INDEPENDENT_AMBULATORY_CARE_PROVIDER_SITE_OTHER): Payer: Commercial Managed Care - HMO | Admitting: Family

## 2016-07-25 ENCOUNTER — Telehealth: Payer: Self-pay

## 2016-07-25 ENCOUNTER — Encounter: Payer: Self-pay | Admitting: Family

## 2016-07-25 VITALS — BP 164/97 | HR 88 | Temp 97.4°F | Resp 18 | Ht 62.0 in | Wt 116.0 lb

## 2016-07-25 DIAGNOSIS — Z9889 Other specified postprocedural states: Secondary | ICD-10-CM

## 2016-07-25 DIAGNOSIS — I6521 Occlusion and stenosis of right carotid artery: Secondary | ICD-10-CM

## 2016-07-25 DIAGNOSIS — F17200 Nicotine dependence, unspecified, uncomplicated: Secondary | ICD-10-CM

## 2016-07-25 NOTE — Telephone Encounter (Signed)
Returned call to pt. In response to message left with answering service.  Reported swelling  And raised area on right neck incision.  Spoke with pt's husband.  Stated he spoke with the MD on call this morning and was instructed to come to the office to be evaluated.  Appt. Given for 10:45 AM with NP.  Husband agreed with appt.

## 2016-07-25 NOTE — Progress Notes (Signed)
Postoperative Visit   History of Present Illness  [name] is a 75 y.o. female who is s/p right carotid endarterectomy on 07/19/16 by Dr. 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 She had been having some intermittent visual disturbances in the right eye.  She returns today with c/o swelling and raised area on right side neck incision that she noted since the surgery, states has not gotten worse. She also c/o severe pain that tramadol is not helping. She is taking hydrocodone/APAP 5/325 q 4h prn pain prescribed by her PCP. She also c/o some dysphagia, hoarseness, and a bit more dyspnea for which she needs to use her supplemental O2.  She states she had bronchitis before the CEA and states this has improved since taking a prescribed antibx.  She denies fever of chills.  She has decreased her smoking to 1 ppd from 2 ppd. She does not have DM. She states she is eating well, has moved her bowels once, states she feels constipated; I advised twice daily stool softeners and Miralax as needed.   The patient has not had stroke or TIA symptoms.  For VQI Use Only  PRE-ADM LIVING: Home  AMB STATUS: Ambulatory  Social History   Social History  . Marital status: Married    Spouse name: N/A  . Number of children: N/A  . Years of education: N/A   Occupational History  . Not on file.   Social History Main Topics  . Smoking status: Current Every Day Smoker    Packs/day: 2.00    Years: 30.00    Types: Cigarettes  . Smokeless tobacco: Never Used     Comment: Down to 1.5 packs per day 06/30/16  . Alcohol use No  . Drug use: No  . Sexual activity: Not on file   Other Topics Concern  . Not on file   Social History Narrative  . No narrative on file   Allergies  Allergen Reactions  . Codeine Shortness Of Breath  . Contrast Media [Iodinated Diagnostic Agents] Rash    Itching and rash     Current Outpatient Prescriptions on File Prior to Visit    Medication Sig Dispense Refill  . albuterol (PROVENTIL HFA;VENTOLIN HFA) 108 (90 Base) MCG/ACT inhaler Inhale 1-2 puffs into the lungs every 6 (six) hours as needed for wheezing or shortness of breath.    Marland Kitchen alendronate (FOSAMAX) 70 MG tablet Take 70 mg by mouth every 7 (seven) days. Take with a full glass of water on an empty stomach.     Marland Kitchen aspirin 81 MG tablet Take 81 mg by mouth 2 (two) times daily.      . naproxen sodium (ANAPROX) 220 MG tablet Take 220 mg by mouth daily as needed (pain).    . predniSONE (DELTASONE) 10 MG tablet Take 10 mg by mouth daily as needed (for bronchitis).     Marland Kitchen tiotropium (SPIRIVA) 18 MCG inhalation capsule Place 18 mcg into inhaler and inhale daily.    . traMADol (ULTRAM) 50 MG tablet Take 1 tablet (50 mg total) by mouth every 6 (six) hours as needed for moderate pain. 6 tablet 0   No current facility-administered medications on file prior to visit.     Physical Examination  Vitals:   07/25/16 1050 07/25/16 1051  BP: (!) 159/97 (!) 164/97  Pulse: 88   Resp: 18   Temp: 97.4 F (36.3 C)   TempSrc: Oral   SpO2: 96%   Weight:  116 lb (52.6 kg)   Height: 5\' 2"  (1.575 m)    Body mass index is 21.22 kg/m.   Right side neck incision with hard swollen area, about 1.5 cm x 3 cm, at proximal end of incision. Some fading bruising is present around the healing incision, no drainage, no erythema, no fluctuance. Neuro: CN 2-12 are intact, Motor strength is 5/5  bilaterally, sensation is grossly intact  Medical Decision Making  [name] is a 75 y.o. female who presents s/p right carotid endarterectomy on 07/19/16.  The patient's neck incision is healing with no stroke symptoms.  I discussed with Dr. Trula Slade pt HPI and results of physical exam.   The swollen area at the proximal part of her right CEA incision is most likely a hematoma which should resolve within a short period of time.  Her mild dysphagia is mostly likely due to the prior presence of  the the ETT, and the small/moderate sized hematoma at her right CEA incision.   Her mild dyspnea is most likely associated with her may years of smoking and her recent bronchitis; her SA02 on RA is 96%.   She is on no anticoagulants, takes a daily 81 mg ASA.  She is not taking a statin.   Her 2 weeks follow with Dr. Scot Dock is scheduled for 08/03/16, she is advised to keep that appointment.   I advised pt and her husband to notify us if she has further concerns re her right side neck CEA incision.  I discussed in depth with the patient the nature of atherosclerosis, and emphasized the importance of maximal medical management including strict control of blood pressure, blood glucose, and lipid levels, obtaining regular exercise, anti-platelet use and cessation of smoking.    I emphasized the importance of routine surveillance of the carotid arteries as recurrence of stenosis is possible, especially with proper management of underlying atherosclerotic disease. The patient agrees to participate in their maximal medical care and routine surveillance.  Thank you for allowing [place] to participate in this patient's care.  [name], Sharmon Leyden, RN, MSN, FNP-C [facility] Office: [phone]  07/25/2016, 10:57 AM  Clinic MD: Trula Slade

## 2016-07-25 NOTE — Patient Instructions (Signed)
Stroke Prevention Some medical conditions and behaviors are associated with an increased chance of having a stroke. You may prevent a stroke by making healthy choices and managing medical conditions. HOW CAN I REDUCE MY RISK OF HAVING A STROKE?   Stay physically active. Get at least 30 minutes of activity on most or all days.  Do not smoke. It may also be helpful to avoid exposure to secondhand smoke.  Limit alcohol use. Moderate alcohol use is considered to be:  No more than 2 drinks per day for men.  No more than 1 drink per day for nonpregnant women.  Eat healthy foods. This involves:  Eating 5 or more servings of fruits and vegetables a day.  Making dietary changes that address high blood pressure (hypertension), high cholesterol, diabetes, or obesity.  Manage your cholesterol levels.  Making food choices that are high in fiber and low in saturated fat, trans fat, and cholesterol may control cholesterol levels.  Take any prescribed medicines to control cholesterol as directed by your health care provider.  Manage your diabetes.  Controlling your carbohydrate and sugar intake is recommended to manage diabetes.  Take any prescribed medicines to control diabetes as directed by your health care provider.  Control your hypertension.  Making food choices that are low in salt (sodium), saturated fat, trans fat, and cholesterol is recommended to manage hypertension.  Ask your health care provider if you need treatment to lower your blood pressure. Take any prescribed medicines to control hypertension as directed by your health care provider.  If you are 18-39 years of age, have your blood pressure checked every 3-5 years. If you are 40 years of age or older, have your blood pressure checked every year.  Maintain a healthy weight.  Reducing calorie intake and making food choices that are low in sodium, saturated fat, trans fat, and cholesterol are recommended to manage  weight.  Stop drug abuse.  Avoid taking birth control pills.  Talk to your health care provider about the risks of taking birth control pills if you are over 35 years old, smoke, get migraines, or have ever had a blood clot.  Get evaluated for sleep disorders (sleep apnea).  Talk to your health care provider about getting a sleep evaluation if you snore a lot or have excessive sleepiness.  Take medicines only as directed by your health care provider.  For some people, aspirin or blood thinners (anticoagulants) are helpful in reducing the risk of forming abnormal blood clots that can lead to stroke. If you have the irregular heart rhythm of atrial fibrillation, you should be on a blood thinner unless there is a good reason you cannot take them.  Understand all your medicine instructions.  Make sure that other conditions (such as anemia or atherosclerosis) are addressed. SEEK IMMEDIATE MEDICAL CARE IF:   You have sudden weakness or numbness of the face, arm, or leg, especially on one side of the body.  Your face or eyelid droops to one side.  You have sudden confusion.  You have trouble speaking (aphasia) or understanding.  You have sudden trouble seeing in one or both eyes.  You have sudden trouble walking.  You have dizziness.  You have a loss of balance or coordination.  You have a sudden, severe headache with no known cause.  You have new chest pain or an irregular heartbeat. Any of these symptoms may represent a serious problem that is an emergency. Do not wait to see if the symptoms will   go away. Get medical help at once. Call your local emergency services (911 in [place] Do not drive yourself to the hospital.   This information is not intended to replace advice given to you by your health care provider. Make sure you discuss any questions you have with your health care provider.   Document Released: 11/17/2004 Document Revised: 10/31/2014 Document Reviewed:  04/12/2013 Elsevier Interactive Patient Education 2016 Elsevier Inc.     Steps to Quit Smoking  Smoking tobacco can be harmful to your health and can affect almost every organ in your body. Smoking puts you, and those around you, at risk for developing many serious chronic diseases. Quitting smoking is difficult, but it is one of the best things that you can do for your health. It is never too late to quit. WHAT ARE THE BENEFITS OF QUITTING SMOKING? When you quit smoking, you lower your risk of developing serious diseases and conditions, such as:  Lung cancer or lung disease, such as COPD.  Heart disease.  Stroke.  Heart attack.  Infertility.  Osteoporosis and bone fractures. Additionally, symptoms such as coughing, wheezing, and shortness of breath may get better when you quit. You may also find that you get sick less often because your body is stronger at fighting off colds and infections. If you are pregnant, quitting smoking can help to reduce your chances of having a baby of low birth weight. HOW DO I GET READY TO QUIT? When you decide to quit smoking, create a plan to make sure that you are successful. Before you quit:  Pick a date to quit. Set a date within the next two weeks to give you time to prepare.  Write down the reasons why you are quitting. Keep this list in places where you will see it often, such as on your bathroom mirror or in your car or wallet.  Identify the people, places, things, and activities that make you want to smoke (triggers) and avoid them. Make sure to take these actions:  Throw away all cigarettes at home, at work, and in your car.  Throw away smoking accessories, such as ashtrays and lighters.  Clean your car and make sure to empty the ashtray.  Clean your home, including curtains and carpets.  Tell your family, friends, and coworkers that you are quitting. Support from your loved ones can make quitting easier.  Talk with your health care  provider about your options for quitting smoking.  Find out what treatment options are covered by your health insurance. WHAT STRATEGIES CAN I USE TO QUIT SMOKING?  Talk with your healthcare provider about different strategies to quit smoking. Some strategies include:  Quitting smoking altogether instead of gradually lessening how much you smoke over a period of time. Research shows that quitting "cold turkey" is more successful than gradually quitting.  Attending in-person counseling to help you build problem-solving skills. You are more likely to have success in quitting if you attend several counseling sessions. Even short sessions of 10 minutes can be effective.  Finding resources and support systems that can help you to quit smoking and remain smoke-free after you quit. These resources are most helpful when you use them often. They can include:  Online chats with a counselor.  Telephone quitlines.  Printed self-help materials.  Support groups or group counseling.  Text messaging programs.  Mobile phone applications.  Taking medicines to help you quit smoking. (If you are pregnant or breastfeeding, talk with your health care provider first.) Some   medicines contain nicotine and some do not. Both types of medicines help with cravings, but the medicines that include nicotine help to relieve withdrawal symptoms. Your health care provider may recommend:  Nicotine patches, gum, or lozenges.  Nicotine inhalers or sprays.  Non-nicotine medicine that is taken by mouth. Talk with your health care provider about combining strategies, such as taking medicines while you are also receiving in-person counseling. Using these two strategies together makes you more likely to succeed in quitting than if you used either strategy on its own. If you are pregnant or breastfeeding, talk with your health care provider about finding counseling or other support strategies to quit smoking. Do not take  medicine to help you quit smoking unless told to do so by your health care provider. WHAT THINGS CAN I DO TO MAKE IT EASIER TO QUIT? Quitting smoking might feel overwhelming at first, but there is a lot that you can do to make it easier. Take these important actions:  Reach out to your family and friends and ask that they support and encourage you during this time. Call telephone quitlines, reach out to support groups, or work with a counselor for support.  Ask people who smoke to avoid smoking around you.  Avoid places that trigger you to smoke, such as bars, parties, or smoke-break areas at work.  Spend time around people who do not smoke.  Lessen stress in your life, because stress can be a smoking trigger for some people. To lessen stress, try:  Exercising regularly.  Deep-breathing exercises.  Yoga.  Meditating.  Performing a body scan. This involves closing your eyes, scanning your body from head to toe, and noticing which parts of your body are particularly tense. Purposefully relax the muscles in those areas.  Download or purchase mobile phone or tablet apps (applications) that can help you stick to your quit plan by providing reminders, tips, and encouragement. There are many free apps, such as QuitGuide from the CDC (Centers for Disease Control and Prevention). You can find other support for quitting smoking (smoking cessation) through smokefree.gov and other websites. HOW WILL I FEEL WHEN I QUIT SMOKING? Within the first 24 hours of quitting smoking, you may start to feel some withdrawal symptoms. These symptoms are usually most noticeable 2-3 days after quitting, but they usually do not last beyond 2-3 weeks. Changes or symptoms that you might experience include:  Mood swings.  Restlessness, anxiety, or irritation.  Difficulty concentrating.  Dizziness.  Strong cravings for sugary foods in addition to nicotine.  Mild weight  gain.  Constipation.  Nausea.  Coughing or a sore throat.  Changes in how your medicines work in your body.  A depressed mood.  Difficulty sleeping (insomnia). After the first 2-3 weeks of quitting, you may start to notice more positive results, such as:  Improved sense of smell and taste.  Decreased coughing and sore throat.  Slower heart rate.  Lower blood pressure.  Clearer skin.  The ability to breathe more easily.  Fewer sick days. Quitting smoking is very challenging for most people. Do not get discouraged if you are not successful the first time. Some people need to make many attempts to quit before they achieve long-term success. Do your best to stick to your quit plan, and talk with your health care provider if you have any questions or concerns.   This information is not intended to replace advice given to you by your health care provider. Make sure you discuss any questions   you have with your health care provider.   Document Released: 10/04/2001 Document Revised: 02/24/2015 Document Reviewed: 02/24/2015 Elsevier Interactive Patient Education 2016 Elsevier Inc.  

## 2016-07-27 NOTE — Discharge Summary (Signed)
[facility] Discharge Summary   Patient ID:  [name] MRN: [id number] DOB/AGE: 01/10/1941 75 y.o.  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 Anxiety   . Arthritis   . Bronchitis   . CAD (coronary artery disease)   . Cancer ([place])    skin cancer right arm  . Carotid artery occlusion   . Cataracts, bilateral   . CHF (congestive heart failure) ([place])   . Chronic airway obstruction, not elsewhere classified   . COPD (chronic obstructive pulmonary disease) ([place])   . Dysphagia, unspecified(787.20)   . Generalized convulsive epilepsy without mention of intractable epilepsy   . GERD (gastroesophageal reflux disease)   . Headache   . Hearing impaired   . Hyperlipidemia   . Hypersomnia with sleep apnea, unspecified   . Osteoporosis   . Oxygen desaturation during sleep    PRN;02 liters  . Paroxysmal atrial tachycardia ([place])   . PONV (postoperative nausea and vomiting)    " I had problems where my heart starts speeding and fluttering; it goes away by itself ."  . Pure hypercholesterolemia   . Reflux   . Rheumatic fever    as a child  . Seizures ([place])   . Shortness of breath 03/11/2010   while laying still  . Wears glasses   . Wears hearing aids     Procedure(s): ENDARTERECTOMY RIGHT CAROTID ARTERY PATCH ANGIOPLASTY RIGHT CAROTID ARTERY USING HEMASHIELD PLATINUM FINESSE PATCH  Discharged Condition: good  HPI: [name] a 75 y.o.femalewho I saw on 06/01/2016. The patient had a left carotid endarterectomy in 2012. I have been following A 60-79% right carotid stenosis. By history the patient did not appear to be symptomatic. Duplex scan of the time of her  last visit showed a peak systolic velocity on the right of 314 cm/s and the end-diastolic velocity ranged from 94-1 44 cm/s. The study was somewhat difficult to interpret because of an irregular cardiac rhythm. In addition, the bifurcation on the right appeared somewhat high. For this reason I recommended a CT angiogram. She also been having some chest pain and therefore we arranged for her to see Dr. Bettina Gavia her cardiologist in anticipation of possible surgery. Of note her left carotid endarterectomy site was widely patent.  Patient is right-handed. She denies any history of stroke, TIAs, expressive or receptive aphasia. She does describe some occasional loss of vision although she's not sure what side this occurs on.  She describes some occasional chest pain. She has undergone previous ablation procedures in [facility].  She is on aspirin. She is not tolerant of statins.    Hospital Course:  [name] is a 75 y.o. female is S/P Right Procedure(s): ENDARTERECTOMY RIGHT CAROTID ARTERY PATCH ANGIOPLASTY RIGHT CAROTID ARTERY USING HEMASHIELD PLATINUM FINESSE PATCH  Post op hypotension bolus X 2 with systolic in the 80, HR 55.  Started on Dopamine IV in PACU.   Cardiology consult called in.  POD#1 VASCULAR SURGERY ASSESSMENT & PLAN:  1 Day Post-Op s/p: Right CEA. Neuro intact.   BP better. Off Dopamine.   Nausea last PM likely related to anesthesia.  Cardiac: Looks like A. Fib this AM. Rate is fine. Cardiology following. Dr. Bettina Gavia is her Cardiologist in [place].  Vascular Quality Initiative: She is on ASA. She does not tolerate statins.   Home later today if no nausea and if OK with Cardiology.  Tele from today reviewed with Dr. Caryl Comes from [place] [facility] - still feels that this is SR with PACs.  May have short spurts of PAT, but does not feel that this is Afib.  Echo reviewed - relatively normal   Would be OK to d/c from Cardiac standpoint - she sees Dr.Munley for  Primary Cardiology.   Consults:  Treatment Team:  Rounding [facility], MD  Significant Diagnostic Studies: CBC Lab Results  Component Value Date   WBC 12.8 (H) 07/20/2016   HGB 12.9 07/20/2016   HCT 38.9 07/20/2016   MCV 89.8 07/20/2016   PLT 208 07/20/2016    BMET    Component Value Date/Time   NA 137 07/20/2016 0515   K 3.4 (L) 07/20/2016 0515   CL 103 07/20/2016 0515   CO2 26 07/20/2016 0515   GLUCOSE 114 (H) 07/20/2016 0515   BUN 7 07/20/2016 0515   CREATININE 0.66 07/20/2016 0515   CALCIUM 7.7 (L) 07/20/2016 0515   GFRNONAA >60 07/20/2016 0515   GFRAA >60 07/20/2016 0515   COAG Lab Results  Component Value Date   INR 0.94 07/19/2016   INR 0.93 06/30/2016   INR 0.91 11/11/2010     Disposition:  Discharge to :Home Discharge Instructions    Call MD for:  redness, tenderness, or signs of infection (pain, swelling, bleeding, redness, odor or green/yellow discharge around incision site)    Complete by:  As directed    Call MD for:  severe or increased pain, loss or decreased feeling  in affected limb(s)    Complete by:  As directed    Call MD for:  temperature >100.5    Complete by:  As directed    Discharge instructions    Complete by:  As directed    You may shower in 24 hours.   Driving Restrictions    Complete by:  As directed    No driving for 2 weeks   Increase activity slowly    Complete by:  As directed    Walk with assistance use walker or cane as needed   Lifting restrictions    Complete by:  As directed    No lifting for 4 weeks   Resume previous diet    Complete by:  As directed        Medication List    TAKE these medications   albuterol 108 (90 Base) MCG/ACT inhaler Commonly known as:  PROVENTIL HFA;VENTOLIN HFA Inhale 1-2 puffs into the lungs every 6 (six) hours as needed for wheezing or shortness of breath.   alendronate 70 MG tablet Commonly known as:  FOSAMAX Take 70 mg by mouth every 7 (seven) days. Take with a full glass  of water on an empty stomach. Notes to patient:  Take as previously taken.   aspirin 81 MG tablet Take 81 mg by mouth 2 (two) times daily.   naproxen sodium 220 MG tablet Commonly known as:  ANAPROX Take 220 mg by mouth daily as needed (pain).   predniSONE 10 MG tablet Commonly known as:  DELTASONE Take 10 mg by mouth daily as needed (for bronchitis).   tiotropium 18 MCG inhalation capsule Commonly known as:  SPIRIVA Place 18 mcg into inhaler and inhale daily.   traMADol 50 MG tablet Commonly known as:  ULTRAM Take 1 tablet (50 mg total) by mouth every 6 (  six) hours as needed for moderate pain.      Verbal and written Discharge instructions given to the patient. Wound care per Discharge AVS Follow-up Information    Annamarie Major, MD Follow up in 2 week(s).   Specialties:  Vascular Surgery, Cardiology Why:  office will call with appt. Contact information: [address] [place] [address] [phone]           Signed: Laurence Slate [facility] 07/27/2016, 2:21 PM --- For [facility] Registry use --- Instructions: Press F2 to tab through selections.  Delete question if not applicable.   Modified Rankin score at D/C (0-6): Rankin Score=0  IV medication needed for:  1. Hypertension: No 2. Hypotension: Yes  Post-op Complications: No  1. Post-op CVA or TIA: No  If yes: Event classification (right eye, left eye, right cortical, left cortical, verterobasilar, other):   If yes: Timing of event (intra-op, <6 hrs post-op, >=6 hrs post-op, unknown):   2. CN injury: No  If yes: CN  injuried   3. Myocardial infarction: No  If yes: Dx by (EKG or clinical, Troponin):   4.  CHF: No  5.  Dysrhythmia (new): No  6. Wound infection: No  7. Reperfusion symptoms: No  8. Return to OR: No  If yes: return to OR for (bleeding, neurologic, other CEA incision, other):   Discharge medications: Statin use:  No  for medical reason   ASA use:  Yes Beta blocker use:  No  for  medical reason   ACE-Inhibitor use:  No  for medical reason   P2Y12 Antagonist use: [x ] None, [ ]  Plavix, [ ]  Plasugrel, [ ]  Ticlopinine, [ ]  Ticagrelor, [ ]  Other, [ ]  No for medical reason, [ ]  Non-compliant, [ ]  Not-indicated Anti-coagulant use:  [x ] None, [ ]  Warfarin, [ ]  Rivaroxaban, [ ]  Dabigatran, [ ]  Other, [ ]  No for medical reason, [ ]  Non-compliant, [ ]  Not-indicated

## 2016-07-29 ENCOUNTER — Encounter: Payer: Self-pay | Admitting: Vascular Surgery

## 2016-08-03 ENCOUNTER — Ambulatory Visit (INDEPENDENT_AMBULATORY_CARE_PROVIDER_SITE_OTHER): Payer: Commercial Managed Care - HMO | Admitting: Vascular Surgery

## 2016-08-03 ENCOUNTER — Encounter: Payer: Self-pay | Admitting: Vascular Surgery

## 2016-08-03 VITALS — BP 151/82 | HR 76 | Temp 98.8°F | Resp 22 | Ht 62.0 in | Wt 114.0 lb

## 2016-08-03 DIAGNOSIS — Z48812 Encounter for surgical aftercare following surgery on the circulatory system: Secondary | ICD-10-CM

## 2016-08-03 DIAGNOSIS — I6529 Occlusion and stenosis of unspecified carotid artery: Secondary | ICD-10-CM

## 2016-08-03 MED ORDER — OXYCODONE-ACETAMINOPHEN 10-325 MG PO TABS: 1.0000 | ORAL_TABLET | Freq: Four times a day (QID) | ORAL | 0 refills | Status: DC | PRN

## 2016-08-03 NOTE — Addendum Note (Signed)
Addended by: Kaleen Mask on: 08/03/2016 10:56 AM   Modules accepted: Orders

## 2016-08-03 NOTE — Progress Notes (Signed)
   Patient name: [name] MRN: [id number] DOB: [birth year] Sex: female  REASON FOR VISIT: Follow up after right carotid endarterectomy  HPI: [name] is a 75 y.o. female who had been having intermittent visual disturbances in the right eye. The right carotid stenosis progressed to greater than 80%. Right carotid endarterectomy was recommended in order to lower her risk of future stroke. She underwent right carotid endarterectomy on 07/19/2016. She comes in for her first postop visit.  Current Outpatient Prescriptions  Medication Sig Dispense Refill  . acetaminophen (TYLENOL) 500 MG tablet Take 500 mg by mouth every 6 (six) hours as needed.    Marland Kitchen albuterol (PROVENTIL HFA;VENTOLIN HFA) 108 (90 Base) MCG/ACT inhaler Inhale 1-2 puffs into the lungs every 6 (six) hours as needed for wheezing or shortness of breath.    Marland Kitchen aspirin 81 MG tablet Take 81 mg by mouth 2 (two) times daily.      . predniSONE (DELTASONE) 10 MG tablet Take 10 mg by mouth daily as needed (for bronchitis).     Marland Kitchen tiotropium (SPIRIVA) 18 MCG inhalation capsule Place 18 mcg into inhaler and inhale daily.    Marland Kitchen alendronate (FOSAMAX) 70 MG tablet Take 70 mg by mouth every 7 (seven) days. Take with a full glass of water on an empty stomach.     Marland Kitchen HYDROcodone-acetaminophen (NORCO/VICODIN) 5-325 MG tablet     . naproxen sodium (ANAPROX) 220 MG tablet Take 220 mg by mouth daily as needed (pain).    . traMADol (ULTRAM) 50 MG tablet Take 1 tablet (50 mg total) by mouth every 6 (six) hours as needed for moderate pain. (Patient not taking: Reported on 08/03/2016) 6 tablet 0   No current facility-administered medications for this visit.     REVIEW OF SYSTEMS:  [X]  denotes positive finding, [ ]  denotes negative finding Cardiac  Comments:  Chest pain or chest pressure:    Shortness of breath upon exertion: X   Short of breath when lying flat: X   Irregular heart rhythm:    Constitutional    Fever or chills:       PHYSICAL EXAM: Vitals:   08/03/16 0829  BP: (!) 147/81  Pulse: 76  Resp: 16  SpO2: 96%  Weight: 114 lb (51.7 kg)  Height: 5\' 2"  (1.575 m)    GENERAL: The patient is a well-nourished female, in no acute distress. The vital signs are documented above. CARDIOVASCULAR: There is a regular rate and rhythm. PULMONARY: There is good air exchange bilaterally without wheezing or rales. Her right neck incision looks fine. NEURO: She has no focal weakness or paresthesias.  MEDICAL ISSUES:  STATUS POST RIGHT CAROTID ENDARTERECTOMY: The patient is doing well status post right carotid endarterectomy. She is on aspirin. She does not tolerate statins. I ordered a follow duplex scan in 6 months and I'll see her back at that time. She knows to call sooner if she has problems.  Deitra Mayo [facility] [phone]

## 2016-11-21 DIAGNOSIS — J449 Chronic obstructive pulmonary disease, unspecified: Secondary | ICD-10-CM | POA: Diagnosis not present

## 2016-11-22 DIAGNOSIS — Z1389 Encounter for screening for other disorder: Secondary | ICD-10-CM | POA: Diagnosis not present

## 2016-11-22 DIAGNOSIS — Z6822 Body mass index (BMI) 22.0-22.9, adult: Secondary | ICD-10-CM | POA: Diagnosis not present

## 2016-11-22 DIAGNOSIS — R69 Illness, unspecified: Secondary | ICD-10-CM | POA: Diagnosis not present

## 2016-11-22 DIAGNOSIS — R300 Dysuria: Secondary | ICD-10-CM | POA: Diagnosis not present

## 2016-11-22 DIAGNOSIS — E559 Vitamin D deficiency, unspecified: Secondary | ICD-10-CM | POA: Diagnosis not present

## 2016-11-22 DIAGNOSIS — J440 Chronic obstructive pulmonary disease with acute lower respiratory infection: Secondary | ICD-10-CM | POA: Diagnosis not present

## 2016-11-22 DIAGNOSIS — E785 Hyperlipidemia, unspecified: Secondary | ICD-10-CM | POA: Diagnosis not present

## 2016-11-22 DIAGNOSIS — G40309 Generalized idiopathic epilepsy and epileptic syndromes, not intractable, without status epilepticus: Secondary | ICD-10-CM | POA: Diagnosis not present

## 2016-12-21 DIAGNOSIS — J449 Chronic obstructive pulmonary disease, unspecified: Secondary | ICD-10-CM | POA: Diagnosis not present

## 2017-01-19 DIAGNOSIS — J449 Chronic obstructive pulmonary disease, unspecified: Secondary | ICD-10-CM | POA: Diagnosis not present

## 2017-01-24 ENCOUNTER — Encounter: Payer: Self-pay | Admitting: Vascular Surgery

## 2017-01-26 DIAGNOSIS — I471 Supraventricular tachycardia: Secondary | ICD-10-CM | POA: Diagnosis not present

## 2017-01-26 DIAGNOSIS — I739 Peripheral vascular disease, unspecified: Secondary | ICD-10-CM | POA: Diagnosis not present

## 2017-01-26 DIAGNOSIS — R69 Illness, unspecified: Secondary | ICD-10-CM | POA: Diagnosis not present

## 2017-01-26 DIAGNOSIS — Z79899 Other long term (current) drug therapy: Secondary | ICD-10-CM | POA: Diagnosis not present

## 2017-01-26 DIAGNOSIS — I491 Atrial premature depolarization: Secondary | ICD-10-CM | POA: Diagnosis not present

## 2017-01-26 DIAGNOSIS — R002 Palpitations: Secondary | ICD-10-CM | POA: Diagnosis not present

## 2017-01-27 DIAGNOSIS — Z79899 Other long term (current) drug therapy: Secondary | ICD-10-CM | POA: Diagnosis not present

## 2017-02-01 ENCOUNTER — Encounter: Payer: Self-pay | Admitting: Vascular Surgery

## 2017-02-01 ENCOUNTER — Ambulatory Visit: Payer: Commercial Managed Care - HMO | Admitting: Vascular Surgery

## 2017-02-01 ENCOUNTER — Ambulatory Visit (INDEPENDENT_AMBULATORY_CARE_PROVIDER_SITE_OTHER): Payer: Medicare HMO | Admitting: Vascular Surgery

## 2017-02-01 ENCOUNTER — Encounter (HOSPITAL_COMMUNITY): Payer: Commercial Managed Care - HMO

## 2017-02-01 ENCOUNTER — Ambulatory Visit (HOSPITAL_COMMUNITY)
Admission: RE | Admit: 2017-02-01 | Discharge: 2017-02-01 | Disposition: A | Discharge status: Home / Self Care | Payer: Medicare HMO | Source: Ambulatory Visit | Attending: Vascular Surgery | Admitting: Vascular Surgery

## 2017-02-01 VITALS — BP 133/85 | HR 77 | Temp 96.9°F | Resp 16 | Ht 62.0 in | Wt 120.0 lb

## 2017-02-01 DIAGNOSIS — I6523 Occlusion and stenosis of bilateral carotid arteries: Secondary | ICD-10-CM

## 2017-02-01 DIAGNOSIS — I6529 Occlusion and stenosis of unspecified carotid artery: Secondary | ICD-10-CM | POA: Insufficient documentation

## 2017-02-01 DIAGNOSIS — Z48812 Encounter for surgical aftercare following surgery on the circulatory system: Secondary | ICD-10-CM

## 2017-02-01 LAB — VAS US CAROTID
LEFT ECA DIAS: -20 cm/s
Left CCA dist dias: 9 cm/s
Left CCA dist sys: 34 cm/s
Left CCA prox dias: 17 cm/s
Left CCA prox sys: 72 cm/s
Left ICA dist dias: -32 cm/s
Left ICA dist sys: -77 cm/s
Left ICA prox dias: 18 cm/s
Left ICA prox sys: 41 cm/s
RCCADSYS: -93 cm/s
RCCAPDIAS: 15 cm/s
RCCAPSYS: 75 cm/s
RIGHT CCA MID DIAS: -18 cm/s
RIGHT ECA DIAS: -22 cm/s
RIGHT VERTEBRAL DIAS: -7 cm/s

## 2017-02-01 NOTE — Progress Notes (Signed)
Patient name: [name] MRN: [id number] DOB: 1941/08/26 Sex: female  REASON FOR VISIT: Follow up of carotid disease.  HPI: [name] is a 76 y.o. female who underwent a left carotid endarterectomy in 2012. Most recently they had a right carotid endarterectomy on 07/19/2016. She comes in for a 6 month follow up visit. She denies any history of stroke, TIAs, expressive or receptive aphasia, or amaurosis fugax.  She has also been having problems with leg cramps in her calves at night. She also describes some calf claudication bilaterally. She does continue to smoke. She denies any history of rest pain in her feet or history of nonhealing ulcers.  She is on aspirin. She does not appear to be tolerating statins and her primary care doctor is adjusting her medications for her cholesterol.  Past Medical History:  Diagnosis Date  . Anxiety   . Arthritis   . Bronchitis   . CAD (coronary artery disease)   . Cancer ([place])    skin cancer right arm  . Carotid artery occlusion   . Cataracts, bilateral   . CHF (congestive heart failure) ([place])   . Chronic airway obstruction, not elsewhere classified   . COPD (chronic obstructive pulmonary disease) ([place])   . Dysphagia, unspecified(787.20)   . Generalized convulsive epilepsy without mention of intractable epilepsy   . GERD (gastroesophageal reflux disease)   . Headache   . Hearing impaired   . Hyperlipidemia   . Hypersomnia with sleep apnea, unspecified   . Osteoporosis   . Oxygen desaturation during sleep    PRN;02 liters  . Paroxysmal atrial tachycardia ([place])   . PONV (postoperative nausea and vomiting)    " I had problems where my heart starts speeding and fluttering; it goes away by itself ."  . Pure hypercholesterolemia   . Reflux   . Rheumatic fever    as a child  . Seizures ([place])   . Shortness of breath 03/11/2010   while laying still  . Wears glasses   . Wears hearing aids     Family History  Problem Relation  Age of Onset  . Heart disease Mother     CHF  . Hyperlipidemia Mother   . Hypertension Mother   . Heart attack Mother   . Deep vein thrombosis Mother   . AAA (abdominal aortic aneurysm) Mother   . Heart disease Father   . Hyperlipidemia Father   . Hypertension Father   . Heart attack Father   . Cancer Sister   . Hyperlipidemia Sister   . Hypertension Sister   . Cancer Brother   . Hyperlipidemia Brother     SOCIAL HISTORY: Social History  Substance Use Topics  . Smoking status: Current Every Day Smoker    Packs/day: 2.00    Years: 30.00    Types: Cigarettes  . Smokeless tobacco: Never Used     Comment: Down to 1-1.5 packs per day 06/30/16  . Alcohol use No    Allergies  Allergen Reactions  . Codeine Shortness Of Breath  . Contrast Media [Iodinated Diagnostic Agents] Rash    Itching and rash    Current Outpatient Prescriptions  Medication Sig Dispense Refill  . acetaminophen (TYLENOL) 500 MG tablet Take 500 mg by mouth every 6 (six) hours as needed.    Marland Kitchen albuterol (PROVENTIL HFA;VENTOLIN HFA) 108 (90 Base) MCG/ACT inhaler Inhale 1-2 puffs into the lungs every 6 (six) hours as needed for wheezing or shortness of breath.    Marland Kitchen  aspirin 81 MG tablet Take 81 mg by mouth 2 (two) times daily.      Marland Kitchen ezetimibe (ZETIA) 10 MG tablet Take 10 mg by mouth daily.    . Garlic 8841 MG CAPS Take by mouth.    . nitroGLYCERIN (NITRODUR - DOSED IN MG/24 HR) 0.4 mg/hr patch Place 0.4 mg onto the skin daily.    . predniSONE (DELTASONE) 10 MG tablet Take 10 mg by mouth daily as needed (for bronchitis).     Marland Kitchen tiotropium (SPIRIVA) 18 MCG inhalation capsule Place 18 mcg into inhaler and inhale daily.     No current facility-administered medications for this visit.     REVIEW OF SYSTEMS:  [X]  denotes positive finding, [ ]  denotes negative finding Cardiac  Comments:  Chest pain or chest pressure:    Shortness of breath upon exertion:    Short of breath when lying flat:    Irregular heart  rhythm:        Vascular    Pain in calf, thigh, or hip brought on by ambulation:    Pain in feet at night that wakes you up from your sleep:     Blood clot in your veins:    Leg swelling:         Pulmonary    Oxygen at home:    Productive cough:     Wheezing:         Neurologic    Sudden weakness in arms or legs:     Sudden numbness in arms or legs:     Sudden onset of difficulty speaking or slurred speech:    Temporary loss of vision in one eye:     Problems with dizziness:         Gastrointestinal    Blood in stool:     Vomited blood:         Genitourinary    Burning when urinating:     Blood in urine:        Psychiatric    Major depression:         Hematologic    Bleeding problems:    Problems with blood clotting too easily:        Skin    Rashes or ulcers:        Constitutional    Fever or chills:      PHYSICAL EXAM: Vitals:   02/01/17 1036 02/01/17 1044  BP: 130/80 133/85  Pulse: 77   Resp: 16   Temp: (!) 96.9 F (36.1 C)   TempSrc: Oral   SpO2: 96%   Weight: 120 lb (54.4 kg)   Height: 5\' 2"  (1.575 m)     GENERAL: The patient is a well-nourished female, in no acute distress. The vital signs are documented above. CARDIAC: There is a regular rate and rhythm.  VASCULAR: I do not detect carotid bruits. She has a diminished right femoral pulse and a barely palpable left femoral pulse. I cannot palpate popliteal or pedal pulses. She has no significant lower extremity swelling. PULMONARY: There is good air exchange bilaterally without wheezing or rales. ABDOMEN: Soft and non-tender with normal pitched bowel sounds.  MUSCULOSKELETAL: There are no major deformities or cyanosis. NEUROLOGIC: No focal weakness or paresthesias are detected. SKIN: There are no ulcers or rashes noted. PSYCHIATRIC: The patient has a normal affect.  DATA:   CAROTID DUPLEX: I have been appealing interpreter carotid duplex scan today. Both carotid endarterectomy sites are  widely patent without evidence of restenosis. Vertebral  arteries are patent with antegrade flow.  MEDICAL ISSUES:  STATUS POST BILATERAL CAROTID ENDARTERECTOMIES: The patient is doing well status post bilateral carotid endarterectomies. She is asymptomatic. She is on aspirin. She is not tolerating statins currently. I will return to follow up carotid duplex scan in 6 months. If this looks good and she will just need a yearly carotid duplex scan.  PERIPHERAL VASCULAR DISEASE: The patient does have evidence of multilevel arterial occlusive disease on exam. However, I do not think her cramps can be attributed to this. Her primary care physician has arranged for a noninvasive study to further work this up. We have again discussed the importance of tobacco cessation. I encouraged her to stay as active as possible.    Deitra Mayo [facility] [place] [phone]

## 2017-02-02 NOTE — Addendum Note (Signed)
Addended by: Lianne Cure A on: 02/02/2017 09:30 AM   Modules accepted: Orders

## 2017-02-08 DIAGNOSIS — Z6822 Body mass index (BMI) 22.0-22.9, adult: Secondary | ICD-10-CM | POA: Diagnosis not present

## 2017-02-08 DIAGNOSIS — I739 Peripheral vascular disease, unspecified: Secondary | ICD-10-CM | POA: Diagnosis not present

## 2017-02-08 DIAGNOSIS — M25552 Pain in left hip: Secondary | ICD-10-CM | POA: Diagnosis not present

## 2017-02-09 DIAGNOSIS — Z79899 Other long term (current) drug therapy: Secondary | ICD-10-CM | POA: Diagnosis not present

## 2017-02-09 DIAGNOSIS — H9113 Presbycusis, bilateral: Secondary | ICD-10-CM | POA: Diagnosis not present

## 2017-02-09 DIAGNOSIS — J449 Chronic obstructive pulmonary disease, unspecified: Secondary | ICD-10-CM | POA: Diagnosis not present

## 2017-02-09 DIAGNOSIS — Z974 Presence of external hearing-aid: Secondary | ICD-10-CM | POA: Diagnosis not present

## 2017-02-09 DIAGNOSIS — M25552 Pain in left hip: Secondary | ICD-10-CM | POA: Diagnosis not present

## 2017-02-09 DIAGNOSIS — Z0000 Encounter for general adult medical examination without abnormal findings: Secondary | ICD-10-CM | POA: Diagnosis not present

## 2017-02-09 DIAGNOSIS — R69 Illness, unspecified: Secondary | ICD-10-CM | POA: Diagnosis not present

## 2017-02-09 DIAGNOSIS — Z7982 Long term (current) use of aspirin: Secondary | ICD-10-CM | POA: Diagnosis not present

## 2017-02-09 DIAGNOSIS — Z6821 Body mass index (BMI) 21.0-21.9, adult: Secondary | ICD-10-CM | POA: Diagnosis not present

## 2017-02-09 DIAGNOSIS — R0989 Other specified symptoms and signs involving the circulatory and respiratory systems: Secondary | ICD-10-CM | POA: Diagnosis not present

## 2017-02-10 DIAGNOSIS — M1612 Unilateral primary osteoarthritis, left hip: Secondary | ICD-10-CM | POA: Diagnosis not present

## 2017-02-10 DIAGNOSIS — M25552 Pain in left hip: Secondary | ICD-10-CM | POA: Diagnosis not present

## 2017-02-17 DIAGNOSIS — N200 Calculus of kidney: Secondary | ICD-10-CM | POA: Diagnosis not present

## 2017-02-17 DIAGNOSIS — R1032 Left lower quadrant pain: Secondary | ICD-10-CM | POA: Diagnosis not present

## 2017-02-17 DIAGNOSIS — M25552 Pain in left hip: Secondary | ICD-10-CM | POA: Diagnosis not present

## 2017-02-19 DIAGNOSIS — J449 Chronic obstructive pulmonary disease, unspecified: Secondary | ICD-10-CM | POA: Diagnosis not present

## 2017-02-27 DIAGNOSIS — M25552 Pain in left hip: Secondary | ICD-10-CM | POA: Diagnosis not present

## 2017-03-01 DIAGNOSIS — M533 Sacrococcygeal disorders, not elsewhere classified: Secondary | ICD-10-CM | POA: Diagnosis not present

## 2017-03-01 DIAGNOSIS — M545 Low back pain: Secondary | ICD-10-CM | POA: Diagnosis not present

## 2017-03-01 DIAGNOSIS — M25652 Stiffness of left hip, not elsewhere classified: Secondary | ICD-10-CM | POA: Diagnosis not present

## 2017-03-01 DIAGNOSIS — M6281 Muscle weakness (generalized): Secondary | ICD-10-CM | POA: Diagnosis not present

## 2017-03-10 DIAGNOSIS — J440 Chronic obstructive pulmonary disease with acute lower respiratory infection: Secondary | ICD-10-CM | POA: Diagnosis not present

## 2017-03-10 DIAGNOSIS — R69 Illness, unspecified: Secondary | ICD-10-CM | POA: Diagnosis not present

## 2017-03-10 DIAGNOSIS — Z6822 Body mass index (BMI) 22.0-22.9, adult: Secondary | ICD-10-CM | POA: Diagnosis not present

## 2017-03-21 DIAGNOSIS — J449 Chronic obstructive pulmonary disease, unspecified: Secondary | ICD-10-CM | POA: Diagnosis not present

## 2017-04-14 IMAGING — CT CT ANGIO NECK
2 of 7 series · 8 of 33 positions shown · IV contrast (OMNI 350)
Comparison: Carotid Doppler 09/04/2009

CLINICAL DATA: Bilateral carotid stenosis. History of left carotid
endarterectomy.

EXAM:
CT ANGIOGRAPHY NECK
TECHNIQUE: Multidetector CT imaging of the neck was performed using the
standard protocol during bolus administration of intravenous
contrast. Multiplanar CT image reconstructions and MIPs were
obtained to evaluate the vascular anatomy. Carotid stenosis
measurements (when applicable) are obtained utilizing NASCET
criteria, using the distal internal carotid diameter as the
denominator.
CONTRAST:  50 mL Isovue 370 IV

[Series 4: cta neck · axial · 0.48mm/px · z∈[+1292,+1370]mm · 2 of 118 slices shown]
[im 40/118  soft-tissue]
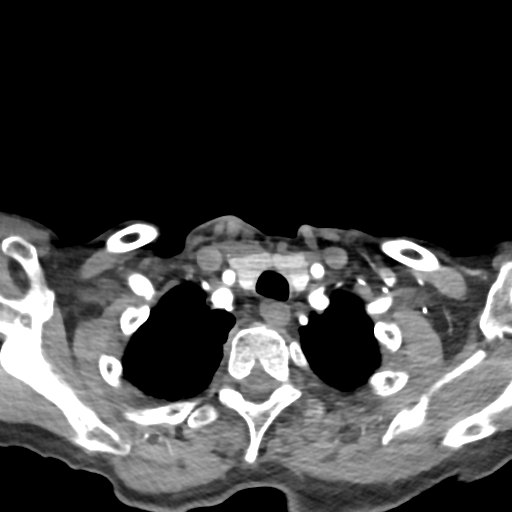
[im 79/118  soft-tissue]
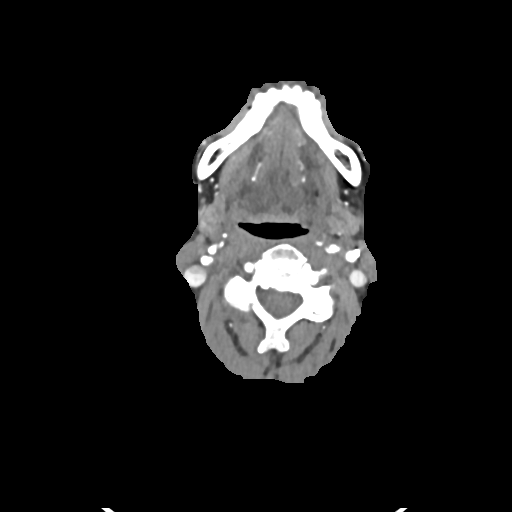

[Series 6: cta neck axial · axial · 0.39mm/px · z∈[+1249,+1416]mm · 6 of 235 slices shown]
[im 34/235  soft-tissue]
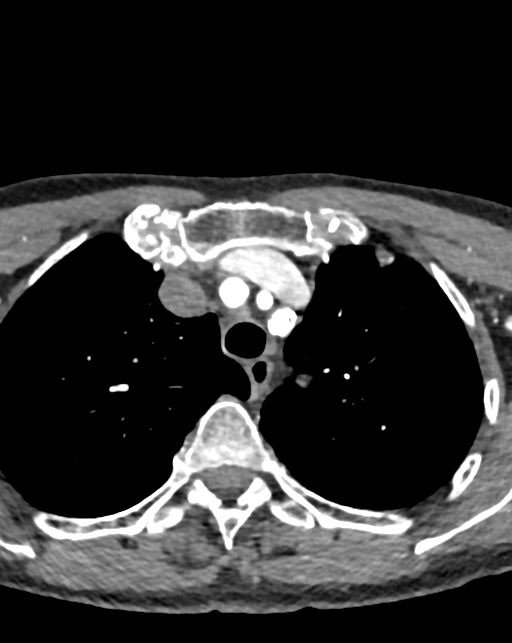
[im 67/235  bone]
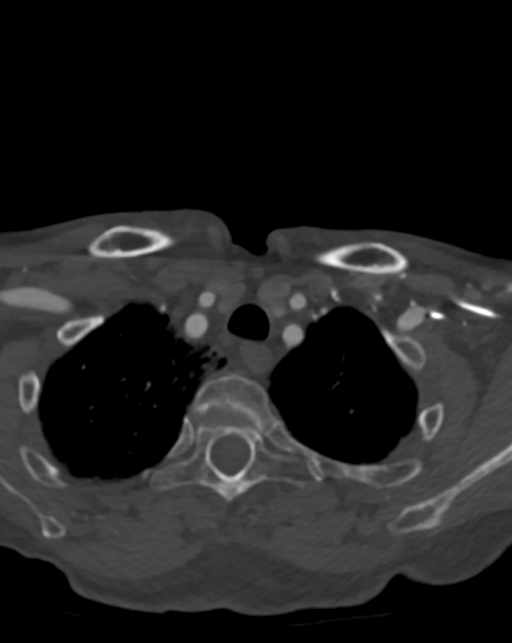
[im 101/235  soft-tissue]
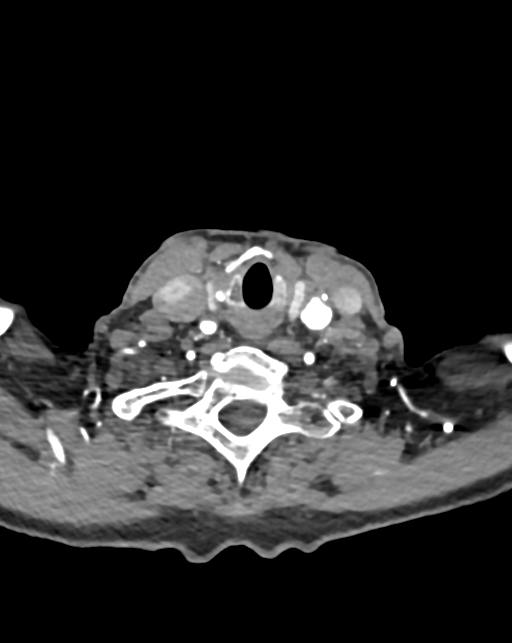
[im 134/235  bone]
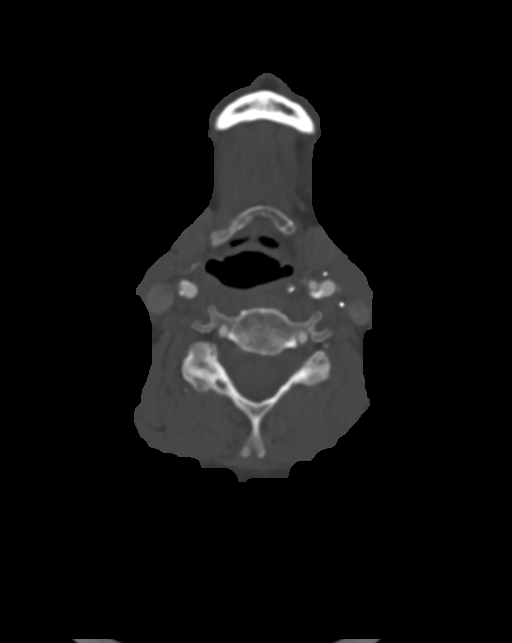
[im 168/235  soft-tissue]
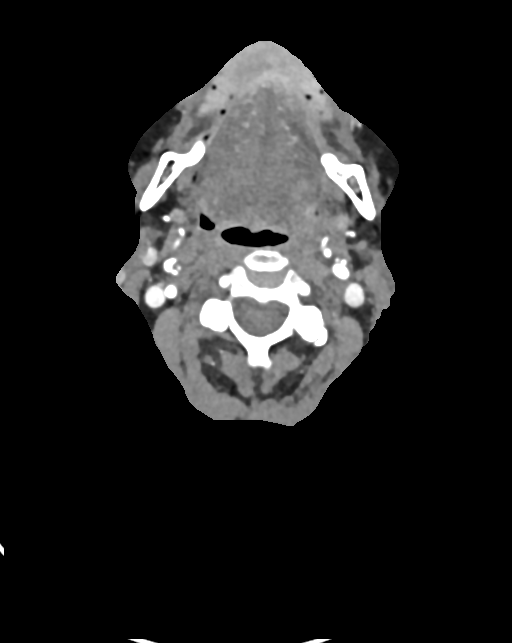
[im 201/235  bone]
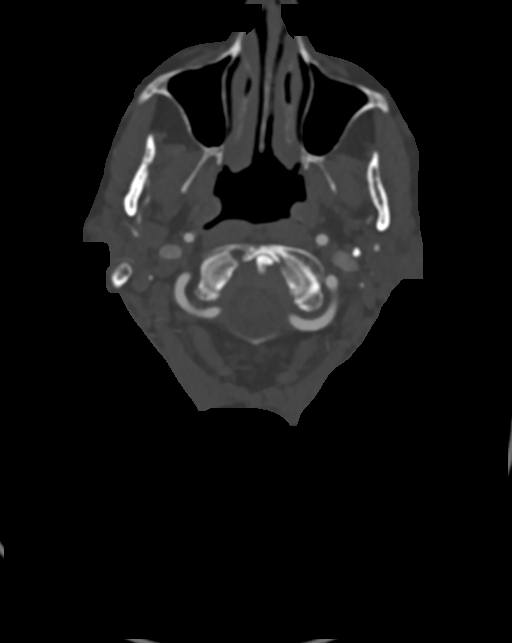

[8 of 33 positions shown; findings below may reference images not displayed]

FINDINGS: Aortic arch: Mild atherosclerotic calcification aortic arch without
aneurysm. Three vessel aortic arch. Proximal great vessels widely
patent with mild atherosclerotic calcification in the proximal left
subclavian artery.

Right carotid system: Atherosclerotic disease in the right common
carotid artery without significant stenosis. Circumferential
calcific stenosis of the right internal carotid artery narrowing the
lumen by 80% diameter stenosis. Mild to moderate stenosis origin of
the right external carotid artery.

Left carotid system: Left common carotid artery widely patent. Prior
carotid endarterectomy with patch angioplasty. There is
atherosclerotic calcification at the bifurcation and proximal
internal carotid artery without significant stenosis. External
carotid artery widely patent

Vertebral arteries:Mild atherosclerotic disease at the origin of the
vertebral artery bilaterally without significant stenosis. Both
vertebral arteries are patent to the basilar without stenosis or
dissection

Skeleton: Anterior listhesis at C3-4, C4-5, C5-6 felt to be
degenerative. No acute skeletal abnormality.

Other neck: Negative for mass or adenopathy in the neck.

Upper chest: Mild apical emphysema and scarring. Negative for
infiltrate or effusion or mass
IMPRESSION: Heavily calcified plaque in the right internal carotid artery
narrowing the lumen by 80% diameter stenosis at the origin. Mild to
moderate stenosis origin right external carotid artery.

Prior left carotid endarterectomy. Atherosclerotic calcification is
present at the bifurcation without significant stenosis.

No significant vertebral artery stenosis.

## 2017-04-21 DIAGNOSIS — R252 Cramp and spasm: Secondary | ICD-10-CM | POA: Diagnosis not present

## 2017-04-21 DIAGNOSIS — G4762 Sleep related leg cramps: Secondary | ICD-10-CM | POA: Diagnosis not present

## 2017-04-21 DIAGNOSIS — D509 Iron deficiency anemia, unspecified: Secondary | ICD-10-CM | POA: Diagnosis not present

## 2017-04-21 DIAGNOSIS — G2581 Restless legs syndrome: Secondary | ICD-10-CM | POA: Diagnosis not present

## 2017-04-21 DIAGNOSIS — Z6822 Body mass index (BMI) 22.0-22.9, adult: Secondary | ICD-10-CM | POA: Diagnosis not present

## 2017-04-21 DIAGNOSIS — R69 Illness, unspecified: Secondary | ICD-10-CM | POA: Diagnosis not present

## 2017-04-21 DIAGNOSIS — J449 Chronic obstructive pulmonary disease, unspecified: Secondary | ICD-10-CM | POA: Diagnosis not present

## 2017-04-21 DIAGNOSIS — J440 Chronic obstructive pulmonary disease with acute lower respiratory infection: Secondary | ICD-10-CM | POA: Diagnosis not present

## 2017-05-04 DIAGNOSIS — J438 Other emphysema: Secondary | ICD-10-CM | POA: Diagnosis not present

## 2017-05-04 DIAGNOSIS — I491 Atrial premature depolarization: Secondary | ICD-10-CM | POA: Diagnosis not present

## 2017-05-04 DIAGNOSIS — I471 Supraventricular tachycardia: Secondary | ICD-10-CM | POA: Diagnosis not present

## 2017-05-04 DIAGNOSIS — R69 Illness, unspecified: Secondary | ICD-10-CM | POA: Diagnosis not present

## 2017-05-04 DIAGNOSIS — R002 Palpitations: Secondary | ICD-10-CM | POA: Diagnosis not present

## 2017-05-04 DIAGNOSIS — I739 Peripheral vascular disease, unspecified: Secondary | ICD-10-CM | POA: Diagnosis not present

## 2017-05-04 DIAGNOSIS — I10 Essential (primary) hypertension: Secondary | ICD-10-CM | POA: Diagnosis not present

## 2017-05-21 DIAGNOSIS — J449 Chronic obstructive pulmonary disease, unspecified: Secondary | ICD-10-CM | POA: Diagnosis not present

## 2017-06-21 DIAGNOSIS — J449 Chronic obstructive pulmonary disease, unspecified: Secondary | ICD-10-CM | POA: Diagnosis not present

## 2017-06-22 DIAGNOSIS — J438 Other emphysema: Secondary | ICD-10-CM | POA: Diagnosis not present

## 2017-06-22 DIAGNOSIS — R69 Illness, unspecified: Secondary | ICD-10-CM | POA: Diagnosis not present

## 2017-06-22 DIAGNOSIS — I739 Peripheral vascular disease, unspecified: Secondary | ICD-10-CM | POA: Diagnosis not present

## 2017-06-22 DIAGNOSIS — I10 Essential (primary) hypertension: Secondary | ICD-10-CM | POA: Diagnosis not present

## 2017-06-22 DIAGNOSIS — I471 Supraventricular tachycardia: Secondary | ICD-10-CM | POA: Diagnosis not present

## 2017-06-22 DIAGNOSIS — I491 Atrial premature depolarization: Secondary | ICD-10-CM | POA: Diagnosis not present

## 2017-06-22 DIAGNOSIS — J440 Chronic obstructive pulmonary disease with acute lower respiratory infection: Secondary | ICD-10-CM | POA: Diagnosis not present

## 2017-06-22 DIAGNOSIS — R002 Palpitations: Secondary | ICD-10-CM | POA: Diagnosis not present

## 2017-07-14 DIAGNOSIS — Z6823 Body mass index (BMI) 23.0-23.9, adult: Secondary | ICD-10-CM | POA: Diagnosis not present

## 2017-07-14 DIAGNOSIS — J440 Chronic obstructive pulmonary disease with acute lower respiratory infection: Secondary | ICD-10-CM | POA: Diagnosis not present

## 2017-07-14 DIAGNOSIS — R69 Illness, unspecified: Secondary | ICD-10-CM | POA: Diagnosis not present

## 2017-07-22 DIAGNOSIS — J449 Chronic obstructive pulmonary disease, unspecified: Secondary | ICD-10-CM | POA: Diagnosis not present

## 2017-08-09 ENCOUNTER — Ambulatory Visit (HOSPITAL_COMMUNITY)
Admission: RE | Admit: 2017-08-09 | Discharge: 2017-08-09 | Disposition: A | Discharge status: Home / Self Care | Payer: Medicare HMO | Source: Ambulatory Visit | Attending: Vascular Surgery | Admitting: Vascular Surgery

## 2017-08-09 ENCOUNTER — Ambulatory Visit (INDEPENDENT_AMBULATORY_CARE_PROVIDER_SITE_OTHER): Payer: Medicare HMO | Admitting: Vascular Surgery

## 2017-08-09 ENCOUNTER — Encounter: Payer: Self-pay | Admitting: Vascular Surgery

## 2017-08-09 VITALS — BP 142/84 | HR 79 | Temp 97.2°F | Resp 14 | Ht 62.0 in | Wt 120.0 lb

## 2017-08-09 DIAGNOSIS — I6523 Occlusion and stenosis of bilateral carotid arteries: Secondary | ICD-10-CM | POA: Insufficient documentation

## 2017-08-09 DIAGNOSIS — I739 Peripheral vascular disease, unspecified: Secondary | ICD-10-CM

## 2017-08-09 LAB — VAS US CAROTID
LCCAPDIAS: 21 cm/s
LCCAPSYS: 86 cm/s
LEFT ECA DIAS: 15 cm/s
LEFT VERTEBRAL DIAS: -22 cm/s
Left CCA dist dias: 9 cm/s
Left CCA dist sys: 40 cm/s
Left ICA dist dias: -19 cm/s
Left ICA dist sys: -63 cm/s
Left ICA prox dias: -17 cm/s
Left ICA prox sys: -52 cm/s
RCCADSYS: -101 cm/s
RCCAPSYS: 113 cm/s
RIGHT CCA MID DIAS: -13 cm/s
RIGHT ECA DIAS: 22 cm/s
RIGHT VERTEBRAL DIAS: 14 cm/s
Right CCA prox dias: 15 cm/s

## 2017-08-09 NOTE — Progress Notes (Signed)
Patient name: [name] MRN: [id number] DOB: 1941/05/17 Sex: female  REASON FOR VISIT:    Follow up of bilateral carotid disease and peripheral vascular disease.  HPI:   [name] is a pleasant 76 y.o. female who I last saw on 02/01/2017. She has undergone bilateral carotid endarterectomies. She had a left carotid endarterectomy on 11/11/2010. She had a right carotid endarterectomy on 07/19/2016. When I saw her last, both carotid endarterectomy sites were widely patent. She comes in for 6 month follow up visit.  She was also complaining of some claudication and on exam had evidence of multilevel arterial occlusive disease. We discussed the importance of tobacco cessation and I encouraged her to stay as active as possible.  Since I saw her last, she denies any history of stroke, TIAs, expressive or receptive aphasia, or amaurosis fugax. She is on aspirin. She does not take statins because of muscle pain.  Again, her main complaint today is cramps in both legs. This occurs mostly at night.She did buy some cream for this which did help. I do not get clear-cut history of rest pain. She does describe some pain in her legs with ambulation I suspect she has claudication. She does continue to smoke Patel's me that she has cut back to 1 pack per day.  Past Medical History:  Diagnosis Date  . Anxiety   . Arthritis   . Bronchitis   . CAD (coronary artery disease)   . Cancer ([place])    skin cancer right arm  . Carotid artery occlusion   . Cataracts, bilateral   . CHF (congestive heart failure) ([place])   . Chronic airway obstruction, not elsewhere classified   . COPD (chronic obstructive pulmonary disease) ([place])   . Dysphagia, unspecified(787.20)   . Generalized convulsive epilepsy without mention of intractable epilepsy   . GERD (gastroesophageal reflux disease)   . Headache   . Hearing impaired   . Hyperlipidemia   . Hypersomnia with sleep apnea, unspecified   . Osteoporosis     . Oxygen desaturation during sleep    PRN;02 liters  . Paroxysmal atrial tachycardia ([place])   . PONV (postoperative nausea and vomiting)    " I had problems where my heart starts speeding and fluttering; it goes away by itself ."  . Pure hypercholesterolemia   . Reflux   . Rheumatic fever    as a child  . Seizures ([place])   . Shortness of breath 03/11/2010   while laying still  . Wears glasses   . Wears hearing aids     Family History  Problem Relation Age of Onset  . Heart disease Mother        CHF  . Hyperlipidemia Mother   . Hypertension Mother   . Heart attack Mother   . Deep vein thrombosis Mother   . AAA (abdominal aortic aneurysm) Mother   . Heart disease Father   . Hyperlipidemia Father   . Hypertension Father   . Heart attack Father   . Cancer Sister   . Hyperlipidemia Sister   . Hypertension Sister   . Cancer Brother   . Hyperlipidemia Brother     SOCIAL HISTORY: Social History  Substance Use Topics  . Smoking status: Current Every Day Smoker    Packs/day: 2.00    Years: 30.00    Types: Cigarettes  . Smokeless tobacco: Never Used     Comment: Down to 1-1.5 packs per day 06/30/16  . Alcohol use No  Allergies  Allergen Reactions  . Codeine Shortness Of Breath  . Pravastatin Other (See Comments)    Give muscle aches and weakness   . Contrast Media [Iodinated Diagnostic Agents] Rash    Itching and rash  . Metrizamide Rash    Itching and rash    Current Outpatient Prescriptions  Medication Sig Dispense Refill  . acetaminophen (TYLENOL) 500 MG tablet Take 500 mg by mouth every 6 (six) hours as needed.    Marland Kitchen albuterol (PROVENTIL HFA;VENTOLIN HFA) 108 (90 Base) MCG/ACT inhaler Inhale 1-2 puffs into the lungs every 6 (six) hours as needed for wheezing or shortness of breath.    Marland Kitchen aspirin 81 MG tablet Take 81 mg by mouth 2 (two) times daily.      . Garlic 0355 MG CAPS Take by mouth.    . nitroGLYCERIN (NITRODUR - DOSED IN MG/24 HR) 0.4 mg/hr patch Place  0.4 mg onto the skin daily.    . predniSONE (DELTASONE) 10 MG tablet Take 10 mg by mouth daily as needed (for bronchitis).     Marland Kitchen tiotropium (SPIRIVA) 18 MCG inhalation capsule Place 18 mcg into inhaler and inhale daily.    Marland Kitchen ezetimibe (ZETIA) 10 MG tablet Take 10 mg by mouth daily.     No current facility-administered medications for this visit.     REVIEW OF SYSTEMS:  [X]  denotes positive finding, [ ]  denotes negative finding Cardiac  Comments:  Chest pain or chest pressure:    Shortness of breath upon exertion:    Short of breath when lying flat:    Irregular heart rhythm:        Vascular    Pain in calf, thigh, or hip brought on by ambulation:    Pain in feet at night that wakes you up from your sleep:  X   Blood clot in your veins:    Leg swelling:         Pulmonary    Oxygen at home: X   Productive cough:  X   Wheezing:  X       Neurologic    Sudden weakness in arms or legs:     Sudden numbness in arms or legs:  X   Sudden onset of difficulty speaking or slurred speech:    Temporary loss of vision in one eye:     Problems with dizziness:         Gastrointestinal    Blood in stool:     Vomited blood:         Genitourinary    Burning when urinating:     Blood in urine:        Psychiatric    Major depression:         Hematologic    Bleeding problems:    Problems with blood clotting too easily:        Skin    Rashes or ulcers:        Constitutional    Fever or chills:     PHYSICAL EXAM:   Vitals:   08/09/17 1041 08/09/17 1045 08/09/17 1047  BP: (!) 146/85 (!) 143/81 (!) 142/84  Pulse: 79 79 79  Resp: 14    Temp: (!) 97.2 F (36.2 C)    SpO2: 97%    Weight: 120 lb (54.4 kg)    Height: 5\' 2"  (1.575 m)      GENERAL: The patient is a well-nourished female, in no acute distress. The vital signs are documented above. CARDIAC: There  is a regular rate and rhythm.  VASCULAR: I do not detect carotid bruits. She has femoral pulses although slightly  diminished. I cannot palpate popliteal or pedal pulses. She does have monophasic Doppler signals in both feet. She has no significant lower extremity swelling. PULMONARY: There is good air exchange bilaterally without wheezing or rales. ABDOMEN: Soft and non-tender with normal pitched bowel sounds.  MUSCULOSKELETAL: There are no major deformities or cyanosis. NEUROLOGIC: No focal weakness or paresthesias are detected. SKIN: There are no ulcers or rashes noted. PSYCHIATRIC: The patient has a normal affect.  DATA:    CAROTID DUPLEX: I have independently interpreted her carotid duplex scan today. Both carotid endarterectomy sites are widely patent without evidence of restenosis. Both vertebral arteries are patent with antegrade flow.  MEDICAL ISSUES:   BILATERAL CAROTID DISEASE: the patient has undergone previous bilateral carotid endarterectomies. Both sites are widely patent without evidence of restenosis. She is on aspirin. She does not tolerate statins. We have again discussed the importance of tobacco cessation. I think we can stretch her follow up out to 1 year and I have ordered a follow up carotid duplex scan in 1 year.  PERIPHERAL VASCULAR DISEASE: she does have evidence of infrainguinal arterial occlusive disease bilaterally which may partially explain her symptoms in her legs. We have discussed potentially proceeding with arteriography. She feels that her symptoms are significantly disabling. I have reviewed with the patient the indications for arteriography. In addition, I have reviewed the potential complications of arteriography including but not limited to: Bleeding, arterial injury, arterial thrombosis, dye action, renal insufficiency, or other unpredictable medical problems. I have explained to the patient that if we find disease amenable to angioplasty we could potentially address this at the same time. I have discussed the potential complications of angioplasty and stenting,  including but not limited to: Bleeding, arterial thrombosis, arterial injury, dissection, or the need for surgical intervention. She would like to think about this more and will callif she elects to proceed with arteriography and possible intervention. In the meantime again I have encouraged her to try to get off the cigarettes and to stay as active as possible.   Deitra Mayo [facility] [place] [phone]

## 2017-08-09 NOTE — Progress Notes (Signed)
Vitals:   08/09/17 1041 08/09/17 1045  BP: (!) 146/85 (!) 143/81  Pulse: 79 79  Resp: 14   Temp: (!) 97.2 F (36.2 C)   SpO2: 97%   Weight: 120 lb (54.4 kg)   Height: 5\' 2"  (1.575 m)

## 2017-08-15 DIAGNOSIS — Z720 Tobacco use: Secondary | ICD-10-CM | POA: Diagnosis not present

## 2017-08-15 DIAGNOSIS — R69 Illness, unspecified: Secondary | ICD-10-CM | POA: Diagnosis not present

## 2017-08-15 DIAGNOSIS — J449 Chronic obstructive pulmonary disease, unspecified: Secondary | ICD-10-CM | POA: Diagnosis not present

## 2017-08-15 DIAGNOSIS — K4090 Unilateral inguinal hernia, without obstruction or gangrene, not specified as recurrent: Secondary | ICD-10-CM | POA: Diagnosis not present

## 2017-08-21 DIAGNOSIS — J449 Chronic obstructive pulmonary disease, unspecified: Secondary | ICD-10-CM | POA: Diagnosis not present

## 2017-08-29 NOTE — Addendum Note (Signed)
Addended by: Lianne Cure A on: 08/29/2017 03:18 PM   Modules accepted: Orders

## 2017-09-21 DIAGNOSIS — J449 Chronic obstructive pulmonary disease, unspecified: Secondary | ICD-10-CM | POA: Diagnosis not present

## 2017-10-21 DIAGNOSIS — J449 Chronic obstructive pulmonary disease, unspecified: Secondary | ICD-10-CM | POA: Diagnosis not present

## 2017-10-31 DIAGNOSIS — R69 Illness, unspecified: Secondary | ICD-10-CM | POA: Diagnosis not present

## 2017-10-31 DIAGNOSIS — J440 Chronic obstructive pulmonary disease with acute lower respiratory infection: Secondary | ICD-10-CM | POA: Diagnosis not present

## 2017-11-21 DIAGNOSIS — J449 Chronic obstructive pulmonary disease, unspecified: Secondary | ICD-10-CM | POA: Diagnosis not present

## 2017-12-21 DIAGNOSIS — J449 Chronic obstructive pulmonary disease, unspecified: Secondary | ICD-10-CM | POA: Diagnosis not present

## 2017-12-22 DIAGNOSIS — J440 Chronic obstructive pulmonary disease with acute lower respiratory infection: Secondary | ICD-10-CM | POA: Diagnosis not present

## 2017-12-22 DIAGNOSIS — R69 Illness, unspecified: Secondary | ICD-10-CM | POA: Diagnosis not present

## 2017-12-22 DIAGNOSIS — Z6823 Body mass index (BMI) 23.0-23.9, adult: Secondary | ICD-10-CM | POA: Diagnosis not present

## 2017-12-22 DIAGNOSIS — M8589 Other specified disorders of bone density and structure, multiple sites: Secondary | ICD-10-CM | POA: Diagnosis not present

## 2017-12-22 DIAGNOSIS — R0781 Pleurodynia: Secondary | ICD-10-CM | POA: Diagnosis not present

## 2017-12-22 DIAGNOSIS — Z1231 Encounter for screening mammogram for malignant neoplasm of breast: Secondary | ICD-10-CM | POA: Diagnosis not present

## 2018-01-12 DIAGNOSIS — H5203 Hypermetropia, bilateral: Secondary | ICD-10-CM | POA: Diagnosis not present

## 2018-01-12 DIAGNOSIS — H40023 Open angle with borderline findings, high risk, bilateral: Secondary | ICD-10-CM | POA: Diagnosis not present

## 2018-01-18 DIAGNOSIS — Z1231 Encounter for screening mammogram for malignant neoplasm of breast: Secondary | ICD-10-CM | POA: Diagnosis not present

## 2018-01-19 DIAGNOSIS — J449 Chronic obstructive pulmonary disease, unspecified: Secondary | ICD-10-CM | POA: Diagnosis not present

## 2018-01-31 DIAGNOSIS — J449 Chronic obstructive pulmonary disease, unspecified: Secondary | ICD-10-CM | POA: Diagnosis not present

## 2018-01-31 DIAGNOSIS — I6523 Occlusion and stenosis of bilateral carotid arteries: Secondary | ICD-10-CM | POA: Diagnosis not present

## 2018-01-31 DIAGNOSIS — R69 Illness, unspecified: Secondary | ICD-10-CM | POA: Diagnosis not present

## 2018-01-31 DIAGNOSIS — E785 Hyperlipidemia, unspecified: Secondary | ICD-10-CM | POA: Diagnosis not present

## 2018-01-31 DIAGNOSIS — I2510 Atherosclerotic heart disease of native coronary artery without angina pectoris: Secondary | ICD-10-CM | POA: Diagnosis not present

## 2018-01-31 DIAGNOSIS — I739 Peripheral vascular disease, unspecified: Secondary | ICD-10-CM | POA: Diagnosis not present

## 2018-01-31 DIAGNOSIS — I471 Supraventricular tachycardia: Secondary | ICD-10-CM | POA: Diagnosis not present

## 2018-01-31 DIAGNOSIS — I10 Essential (primary) hypertension: Secondary | ICD-10-CM | POA: Diagnosis not present

## 2018-02-19 DIAGNOSIS — J449 Chronic obstructive pulmonary disease, unspecified: Secondary | ICD-10-CM | POA: Diagnosis not present

## 2018-03-20 DIAGNOSIS — Z1211 Encounter for screening for malignant neoplasm of colon: Secondary | ICD-10-CM | POA: Diagnosis not present

## 2018-03-20 DIAGNOSIS — J440 Chronic obstructive pulmonary disease with acute lower respiratory infection: Secondary | ICD-10-CM | POA: Diagnosis not present

## 2018-03-20 DIAGNOSIS — G5602 Carpal tunnel syndrome, left upper limb: Secondary | ICD-10-CM | POA: Diagnosis not present

## 2018-03-21 DIAGNOSIS — J449 Chronic obstructive pulmonary disease, unspecified: Secondary | ICD-10-CM | POA: Diagnosis not present

## 2018-03-23 DIAGNOSIS — G5622 Lesion of ulnar nerve, left upper limb: Secondary | ICD-10-CM | POA: Diagnosis not present

## 2018-03-23 DIAGNOSIS — M19041 Primary osteoarthritis, right hand: Secondary | ICD-10-CM | POA: Diagnosis not present

## 2018-03-23 DIAGNOSIS — M1811 Unilateral primary osteoarthritis of first carpometacarpal joint, right hand: Secondary | ICD-10-CM | POA: Diagnosis not present

## 2018-04-21 DIAGNOSIS — J449 Chronic obstructive pulmonary disease, unspecified: Secondary | ICD-10-CM | POA: Diagnosis not present

## 2018-05-18 DIAGNOSIS — Z6823 Body mass index (BMI) 23.0-23.9, adult: Secondary | ICD-10-CM | POA: Diagnosis not present

## 2018-05-18 DIAGNOSIS — R69 Illness, unspecified: Secondary | ICD-10-CM | POA: Diagnosis not present

## 2018-05-18 DIAGNOSIS — R300 Dysuria: Secondary | ICD-10-CM | POA: Diagnosis not present

## 2018-05-18 DIAGNOSIS — R600 Localized edema: Secondary | ICD-10-CM | POA: Diagnosis not present

## 2018-05-18 DIAGNOSIS — J440 Chronic obstructive pulmonary disease with acute lower respiratory infection: Secondary | ICD-10-CM | POA: Diagnosis not present

## 2018-05-21 DIAGNOSIS — J449 Chronic obstructive pulmonary disease, unspecified: Secondary | ICD-10-CM | POA: Diagnosis not present

## 2018-06-18 DIAGNOSIS — L820 Inflamed seborrheic keratosis: Secondary | ICD-10-CM | POA: Diagnosis not present

## 2018-06-18 DIAGNOSIS — L300 Nummular dermatitis: Secondary | ICD-10-CM | POA: Diagnosis not present

## 2018-06-21 DIAGNOSIS — J449 Chronic obstructive pulmonary disease, unspecified: Secondary | ICD-10-CM | POA: Diagnosis not present

## 2018-06-22 DIAGNOSIS — J449 Chronic obstructive pulmonary disease, unspecified: Secondary | ICD-10-CM | POA: Diagnosis not present

## 2018-06-22 DIAGNOSIS — J440 Chronic obstructive pulmonary disease with acute lower respiratory infection: Secondary | ICD-10-CM | POA: Diagnosis not present

## 2018-07-20 DIAGNOSIS — J449 Chronic obstructive pulmonary disease, unspecified: Secondary | ICD-10-CM | POA: Diagnosis not present

## 2018-07-22 DIAGNOSIS — J449 Chronic obstructive pulmonary disease, unspecified: Secondary | ICD-10-CM | POA: Diagnosis not present

## 2018-07-23 DIAGNOSIS — J449 Chronic obstructive pulmonary disease, unspecified: Secondary | ICD-10-CM | POA: Diagnosis not present

## 2018-07-23 DIAGNOSIS — J440 Chronic obstructive pulmonary disease with acute lower respiratory infection: Secondary | ICD-10-CM | POA: Diagnosis not present

## 2018-07-23 DIAGNOSIS — R1011 Right upper quadrant pain: Secondary | ICD-10-CM | POA: Diagnosis not present

## 2018-07-23 DIAGNOSIS — Z6823 Body mass index (BMI) 23.0-23.9, adult: Secondary | ICD-10-CM | POA: Diagnosis not present

## 2018-07-25 DIAGNOSIS — Z720 Tobacco use: Secondary | ICD-10-CM | POA: Diagnosis not present

## 2018-07-25 DIAGNOSIS — R1011 Right upper quadrant pain: Secondary | ICD-10-CM | POA: Diagnosis not present

## 2018-07-25 DIAGNOSIS — K4090 Unilateral inguinal hernia, without obstruction or gangrene, not specified as recurrent: Secondary | ICD-10-CM | POA: Diagnosis not present

## 2018-07-25 DIAGNOSIS — J449 Chronic obstructive pulmonary disease, unspecified: Secondary | ICD-10-CM | POA: Diagnosis not present

## 2018-07-30 DIAGNOSIS — K828 Other specified diseases of gallbladder: Secondary | ICD-10-CM | POA: Diagnosis not present

## 2018-07-30 DIAGNOSIS — R1011 Right upper quadrant pain: Secondary | ICD-10-CM | POA: Diagnosis not present

## 2018-07-30 DIAGNOSIS — R110 Nausea: Secondary | ICD-10-CM | POA: Diagnosis not present

## 2018-07-30 DIAGNOSIS — R252 Cramp and spasm: Secondary | ICD-10-CM | POA: Diagnosis not present

## 2018-07-31 DIAGNOSIS — R1011 Right upper quadrant pain: Secondary | ICD-10-CM | POA: Diagnosis not present

## 2018-07-31 DIAGNOSIS — Z720 Tobacco use: Secondary | ICD-10-CM | POA: Diagnosis not present

## 2018-07-31 DIAGNOSIS — K4090 Unilateral inguinal hernia, without obstruction or gangrene, not specified as recurrent: Secondary | ICD-10-CM | POA: Diagnosis not present

## 2018-07-31 DIAGNOSIS — J449 Chronic obstructive pulmonary disease, unspecified: Secondary | ICD-10-CM | POA: Diagnosis not present

## 2018-08-21 DIAGNOSIS — J449 Chronic obstructive pulmonary disease, unspecified: Secondary | ICD-10-CM | POA: Diagnosis not present

## 2018-08-22 DIAGNOSIS — J449 Chronic obstructive pulmonary disease, unspecified: Secondary | ICD-10-CM | POA: Diagnosis not present

## 2018-09-05 DIAGNOSIS — Z23 Encounter for immunization: Secondary | ICD-10-CM | POA: Diagnosis not present

## 2018-09-05 DIAGNOSIS — J449 Chronic obstructive pulmonary disease, unspecified: Secondary | ICD-10-CM | POA: Diagnosis not present

## 2018-09-05 DIAGNOSIS — R69 Illness, unspecified: Secondary | ICD-10-CM | POA: Diagnosis not present

## 2018-09-21 DIAGNOSIS — J449 Chronic obstructive pulmonary disease, unspecified: Secondary | ICD-10-CM | POA: Diagnosis not present

## 2018-09-22 DIAGNOSIS — J449 Chronic obstructive pulmonary disease, unspecified: Secondary | ICD-10-CM | POA: Diagnosis not present

## 2018-09-25 DIAGNOSIS — S61412A Laceration without foreign body of left hand, initial encounter: Secondary | ICD-10-CM | POA: Diagnosis not present

## 2018-09-26 DIAGNOSIS — L03011 Cellulitis of right finger: Secondary | ICD-10-CM | POA: Diagnosis not present

## 2018-09-26 DIAGNOSIS — J449 Chronic obstructive pulmonary disease, unspecified: Secondary | ICD-10-CM | POA: Diagnosis not present

## 2018-10-12 DIAGNOSIS — L820 Inflamed seborrheic keratosis: Secondary | ICD-10-CM | POA: Diagnosis not present

## 2018-10-16 DIAGNOSIS — J440 Chronic obstructive pulmonary disease with acute lower respiratory infection: Secondary | ICD-10-CM | POA: Diagnosis not present

## 2018-10-16 DIAGNOSIS — R69 Illness, unspecified: Secondary | ICD-10-CM | POA: Diagnosis not present

## 2018-10-21 DIAGNOSIS — J449 Chronic obstructive pulmonary disease, unspecified: Secondary | ICD-10-CM | POA: Diagnosis not present

## 2018-10-22 DIAGNOSIS — J449 Chronic obstructive pulmonary disease, unspecified: Secondary | ICD-10-CM | POA: Diagnosis not present

## 2018-11-19 DIAGNOSIS — J440 Chronic obstructive pulmonary disease with acute lower respiratory infection: Secondary | ICD-10-CM | POA: Diagnosis not present

## 2018-11-21 DIAGNOSIS — J449 Chronic obstructive pulmonary disease, unspecified: Secondary | ICD-10-CM | POA: Diagnosis not present

## 2018-11-22 DIAGNOSIS — J449 Chronic obstructive pulmonary disease, unspecified: Secondary | ICD-10-CM | POA: Diagnosis not present

## 2018-11-29 DIAGNOSIS — G5622 Lesion of ulnar nerve, left upper limb: Secondary | ICD-10-CM | POA: Diagnosis not present

## 2018-12-04 DIAGNOSIS — G5603 Carpal tunnel syndrome, bilateral upper limbs: Secondary | ICD-10-CM | POA: Diagnosis not present

## 2018-12-13 DIAGNOSIS — G5622 Lesion of ulnar nerve, left upper limb: Secondary | ICD-10-CM | POA: Diagnosis not present

## 2018-12-22 DIAGNOSIS — J449 Chronic obstructive pulmonary disease, unspecified: Secondary | ICD-10-CM | POA: Diagnosis not present

## 2018-12-26 DIAGNOSIS — G5622 Lesion of ulnar nerve, left upper limb: Secondary | ICD-10-CM | POA: Diagnosis not present

## 2018-12-26 DIAGNOSIS — M6281 Muscle weakness (generalized): Secondary | ICD-10-CM | POA: Diagnosis not present

## 2019-01-17 DIAGNOSIS — R69 Illness, unspecified: Secondary | ICD-10-CM | POA: Diagnosis not present

## 2019-01-17 DIAGNOSIS — K2970 Gastritis, unspecified, without bleeding: Secondary | ICD-10-CM | POA: Diagnosis not present

## 2019-01-17 DIAGNOSIS — J440 Chronic obstructive pulmonary disease with acute lower respiratory infection: Secondary | ICD-10-CM | POA: Diagnosis not present

## 2019-01-20 DIAGNOSIS — J449 Chronic obstructive pulmonary disease, unspecified: Secondary | ICD-10-CM | POA: Diagnosis not present

## 2019-01-21 DIAGNOSIS — J449 Chronic obstructive pulmonary disease, unspecified: Secondary | ICD-10-CM | POA: Diagnosis not present

## 2019-02-20 DIAGNOSIS — J449 Chronic obstructive pulmonary disease, unspecified: Secondary | ICD-10-CM | POA: Diagnosis not present

## 2019-02-21 DIAGNOSIS — J449 Chronic obstructive pulmonary disease, unspecified: Secondary | ICD-10-CM | POA: Diagnosis not present

## 2019-03-14 DIAGNOSIS — Z1231 Encounter for screening mammogram for malignant neoplasm of breast: Secondary | ICD-10-CM | POA: Diagnosis not present

## 2019-03-14 DIAGNOSIS — J449 Chronic obstructive pulmonary disease, unspecified: Secondary | ICD-10-CM | POA: Diagnosis not present

## 2019-03-14 DIAGNOSIS — R69 Illness, unspecified: Secondary | ICD-10-CM | POA: Diagnosis not present

## 2019-03-14 DIAGNOSIS — E785 Hyperlipidemia, unspecified: Secondary | ICD-10-CM | POA: Diagnosis not present

## 2019-03-14 DIAGNOSIS — J440 Chronic obstructive pulmonary disease with acute lower respiratory infection: Secondary | ICD-10-CM | POA: Diagnosis not present

## 2019-03-14 DIAGNOSIS — D649 Anemia, unspecified: Secondary | ICD-10-CM | POA: Diagnosis not present

## 2019-03-22 DIAGNOSIS — J449 Chronic obstructive pulmonary disease, unspecified: Secondary | ICD-10-CM | POA: Diagnosis not present

## 2019-03-23 DIAGNOSIS — J449 Chronic obstructive pulmonary disease, unspecified: Secondary | ICD-10-CM | POA: Diagnosis not present

## 2019-04-02 DIAGNOSIS — J209 Acute bronchitis, unspecified: Secondary | ICD-10-CM | POA: Diagnosis not present

## 2019-04-11 DIAGNOSIS — R69 Illness, unspecified: Secondary | ICD-10-CM | POA: Diagnosis not present

## 2019-04-22 DIAGNOSIS — J449 Chronic obstructive pulmonary disease, unspecified: Secondary | ICD-10-CM | POA: Diagnosis not present

## 2019-04-23 DIAGNOSIS — J449 Chronic obstructive pulmonary disease, unspecified: Secondary | ICD-10-CM | POA: Diagnosis not present

## 2019-05-21 DIAGNOSIS — Z1231 Encounter for screening mammogram for malignant neoplasm of breast: Secondary | ICD-10-CM | POA: Diagnosis not present

## 2019-05-22 DIAGNOSIS — R69 Illness, unspecified: Secondary | ICD-10-CM | POA: Diagnosis not present

## 2019-05-22 DIAGNOSIS — J449 Chronic obstructive pulmonary disease, unspecified: Secondary | ICD-10-CM | POA: Diagnosis not present

## 2019-05-23 DIAGNOSIS — J449 Chronic obstructive pulmonary disease, unspecified: Secondary | ICD-10-CM | POA: Diagnosis not present

## 2019-06-13 DIAGNOSIS — Z87891 Personal history of nicotine dependence: Secondary | ICD-10-CM | POA: Diagnosis not present

## 2019-06-13 DIAGNOSIS — J449 Chronic obstructive pulmonary disease, unspecified: Secondary | ICD-10-CM | POA: Diagnosis not present

## 2019-06-22 DIAGNOSIS — J449 Chronic obstructive pulmonary disease, unspecified: Secondary | ICD-10-CM | POA: Diagnosis not present

## 2019-06-23 DIAGNOSIS — J449 Chronic obstructive pulmonary disease, unspecified: Secondary | ICD-10-CM | POA: Diagnosis not present

## 2019-07-23 DIAGNOSIS — J449 Chronic obstructive pulmonary disease, unspecified: Secondary | ICD-10-CM | POA: Diagnosis not present

## 2019-07-24 DIAGNOSIS — J449 Chronic obstructive pulmonary disease, unspecified: Secondary | ICD-10-CM | POA: Diagnosis not present

## 2019-07-29 DIAGNOSIS — M25561 Pain in right knee: Secondary | ICD-10-CM | POA: Diagnosis not present

## 2019-07-29 DIAGNOSIS — R600 Localized edema: Secondary | ICD-10-CM | POA: Diagnosis not present

## 2019-07-29 DIAGNOSIS — Z23 Encounter for immunization: Secondary | ICD-10-CM | POA: Diagnosis not present

## 2019-07-30 DIAGNOSIS — R600 Localized edema: Secondary | ICD-10-CM | POA: Diagnosis not present

## 2019-07-31 DIAGNOSIS — M1711 Unilateral primary osteoarthritis, right knee: Secondary | ICD-10-CM | POA: Diagnosis not present

## 2019-08-22 DIAGNOSIS — J449 Chronic obstructive pulmonary disease, unspecified: Secondary | ICD-10-CM | POA: Diagnosis not present

## 2019-08-23 DIAGNOSIS — J449 Chronic obstructive pulmonary disease, unspecified: Secondary | ICD-10-CM | POA: Diagnosis not present

## 2019-09-06 DIAGNOSIS — J440 Chronic obstructive pulmonary disease with acute lower respiratory infection: Secondary | ICD-10-CM | POA: Diagnosis not present

## 2019-09-06 DIAGNOSIS — J209 Acute bronchitis, unspecified: Secondary | ICD-10-CM | POA: Diagnosis not present

## 2019-09-22 DIAGNOSIS — J449 Chronic obstructive pulmonary disease, unspecified: Secondary | ICD-10-CM | POA: Diagnosis not present

## 2019-10-22 DIAGNOSIS — J449 Chronic obstructive pulmonary disease, unspecified: Secondary | ICD-10-CM | POA: Diagnosis not present

## 2019-11-09 DIAGNOSIS — Z6824 Body mass index (BMI) 24.0-24.9, adult: Secondary | ICD-10-CM | POA: Diagnosis not present

## 2019-11-09 DIAGNOSIS — R1084 Generalized abdominal pain: Secondary | ICD-10-CM | POA: Diagnosis not present

## 2019-11-09 DIAGNOSIS — J209 Acute bronchitis, unspecified: Secondary | ICD-10-CM | POA: Diagnosis not present

## 2019-11-09 DIAGNOSIS — J440 Chronic obstructive pulmonary disease with acute lower respiratory infection: Secondary | ICD-10-CM | POA: Diagnosis not present

## 2019-11-15 DIAGNOSIS — N200 Calculus of kidney: Secondary | ICD-10-CM | POA: Diagnosis not present

## 2019-11-15 DIAGNOSIS — K5730 Diverticulosis of large intestine without perforation or abscess without bleeding: Secondary | ICD-10-CM | POA: Diagnosis not present

## 2019-11-15 DIAGNOSIS — R1084 Generalized abdominal pain: Secondary | ICD-10-CM | POA: Diagnosis not present

## 2019-11-22 DIAGNOSIS — J449 Chronic obstructive pulmonary disease, unspecified: Secondary | ICD-10-CM | POA: Diagnosis not present

## 2019-11-26 DIAGNOSIS — J449 Chronic obstructive pulmonary disease, unspecified: Secondary | ICD-10-CM | POA: Diagnosis not present

## 2019-11-26 DIAGNOSIS — R1011 Right upper quadrant pain: Secondary | ICD-10-CM | POA: Diagnosis not present

## 2019-11-26 DIAGNOSIS — K811 Chronic cholecystitis: Secondary | ICD-10-CM | POA: Diagnosis not present

## 2019-11-26 DIAGNOSIS — Z720 Tobacco use: Secondary | ICD-10-CM | POA: Diagnosis not present

## 2019-12-22 DIAGNOSIS — J449 Chronic obstructive pulmonary disease, unspecified: Secondary | ICD-10-CM | POA: Diagnosis not present

## 2020-01-14 DIAGNOSIS — Z87891 Personal history of nicotine dependence: Secondary | ICD-10-CM | POA: Diagnosis not present

## 2020-01-14 DIAGNOSIS — R69 Illness, unspecified: Secondary | ICD-10-CM | POA: Diagnosis not present

## 2020-01-14 DIAGNOSIS — J449 Chronic obstructive pulmonary disease, unspecified: Secondary | ICD-10-CM | POA: Diagnosis not present

## 2020-01-14 DIAGNOSIS — G40309 Generalized idiopathic epilepsy and epileptic syndromes, not intractable, without status epilepticus: Secondary | ICD-10-CM | POA: Diagnosis not present

## 2020-01-14 DIAGNOSIS — E559 Vitamin D deficiency, unspecified: Secondary | ICD-10-CM | POA: Diagnosis not present

## 2020-01-14 DIAGNOSIS — E785 Hyperlipidemia, unspecified: Secondary | ICD-10-CM | POA: Diagnosis not present

## 2020-01-15 DIAGNOSIS — E559 Vitamin D deficiency, unspecified: Secondary | ICD-10-CM | POA: Diagnosis not present

## 2020-01-15 DIAGNOSIS — E785 Hyperlipidemia, unspecified: Secondary | ICD-10-CM | POA: Diagnosis not present

## 2020-01-16 DIAGNOSIS — Z1331 Encounter for screening for depression: Secondary | ICD-10-CM | POA: Diagnosis not present

## 2020-01-16 DIAGNOSIS — N959 Unspecified menopausal and perimenopausal disorder: Secondary | ICD-10-CM | POA: Diagnosis not present

## 2020-01-16 DIAGNOSIS — E785 Hyperlipidemia, unspecified: Secondary | ICD-10-CM | POA: Diagnosis not present

## 2020-01-16 DIAGNOSIS — Z1211 Encounter for screening for malignant neoplasm of colon: Secondary | ICD-10-CM | POA: Diagnosis not present

## 2020-01-16 DIAGNOSIS — Z9181 History of falling: Secondary | ICD-10-CM | POA: Diagnosis not present

## 2020-01-16 DIAGNOSIS — Z139 Encounter for screening, unspecified: Secondary | ICD-10-CM | POA: Diagnosis not present

## 2020-01-16 DIAGNOSIS — Z1231 Encounter for screening mammogram for malignant neoplasm of breast: Secondary | ICD-10-CM | POA: Diagnosis not present

## 2020-01-16 DIAGNOSIS — Z0000 Encounter for general adult medical examination without abnormal findings: Secondary | ICD-10-CM | POA: Diagnosis not present

## 2020-01-20 DIAGNOSIS — J449 Chronic obstructive pulmonary disease, unspecified: Secondary | ICD-10-CM | POA: Diagnosis not present

## 2020-02-03 ENCOUNTER — Encounter: Payer: Self-pay | Admitting: Allergy and Immunology

## 2020-02-03 ENCOUNTER — Other Ambulatory Visit: Payer: Self-pay

## 2020-02-03 ENCOUNTER — Ambulatory Visit (INDEPENDENT_AMBULATORY_CARE_PROVIDER_SITE_OTHER): Payer: Medicare HMO | Admitting: Allergy and Immunology

## 2020-02-03 VITALS — BP 128/82 | HR 86 | Temp 97.9°F | Resp 16 | Ht 62.0 in | Wt 129.8 lb

## 2020-02-03 DIAGNOSIS — F17200 Nicotine dependence, unspecified, uncomplicated: Secondary | ICD-10-CM

## 2020-02-03 DIAGNOSIS — F17210 Nicotine dependence, cigarettes, uncomplicated: Secondary | ICD-10-CM

## 2020-02-03 DIAGNOSIS — J449 Chronic obstructive pulmonary disease, unspecified: Secondary | ICD-10-CM

## 2020-02-03 DIAGNOSIS — R69 Illness, unspecified: Secondary | ICD-10-CM | POA: Diagnosis not present

## 2020-02-03 DIAGNOSIS — R0902 Hypoxemia: Secondary | ICD-10-CM | POA: Diagnosis not present

## 2020-02-03 DIAGNOSIS — K219 Gastro-esophageal reflux disease without esophagitis: Secondary | ICD-10-CM

## 2020-02-03 MED ORDER — ALBUTEROL SULFATE HFA 108 (90 BASE) MCG/ACT IN AERS: INHALATION_SPRAY | RESPIRATORY_TRACT | 1 refills | Status: DC

## 2020-02-03 MED ORDER — TRELEGY ELLIPTA 200-62.5-25 MCG/INH IN AEPB: 1.0000 | INHALATION_SPRAY | Freq: Every day | RESPIRATORY_TRACT | 5 refills | Status: DC

## 2020-02-03 MED ORDER — PANTOPRAZOLE SODIUM 40 MG PO TBEC: 40.0000 mg | DELAYED_RELEASE_TABLET | Freq: Every day | ORAL | 5 refills | Status: DC

## 2020-02-03 MED ORDER — MONTELUKAST SODIUM 10 MG PO TABS: 10.0000 mg | ORAL_TABLET | Freq: Every day | ORAL | 5 refills | Status: DC

## 2020-02-03 NOTE — Progress Notes (Signed)
[place] [facility] [place]   NEW PATIENT NOTE  Referring Provider: No ref. provider found Primary Provider: Nicoletta Dress, MD Date of office visit: 02/03/2020    Subjective:   Chief Complaint:  [name] (DOB: 07/16/1941) is a 79 y.o. female who presents to the clinic on 02/03/2020 with a chief complaint of Asthma and Allergic Rhinitis  .  HPI: [name] presents to this clinic in evaluation of breathing problems.  Apparently for many years she has been having problems with her breathing.  She has cough and wheezing and sputum production and she occasionally has gagging and posttussive emesis.  She is completely out of breath no matter what she does.  She gets out of breath taking a shower or dressing.  She cannot really walk very far.  She wakes up at nighttime short of breath.  She has been given multiple inhalers in the past which she is not really sure have helped her very much.  She has seen a local pulmonologist in [place] of which this interaction did not go very well.  She states that she received systemic steroids on a monthly basis along with antibiotics multiple times a year.  She does not really have a significant amount of upper airway symptoms.  She does have reflux all the way up into her throat.  She is using Carafate at this point in time and has been on Nexium in the past.  She drinks 6 cups of coffee per day.  She has a prolonged smoking history of 37 years at about 1 pack/day.  She has received 2 Moderna Covid vaccines.  Past Medical History:  Diagnosis Date  . Anxiety   . Arthritis   . Asthma   . Bronchitis   . CAD (coronary artery disease)   . Cancer ([place])    skin cancer right arm  . Carotid artery occlusion   . Cataracts, bilateral   . CHF (congestive heart failure) ([place])   . Chronic airway obstruction, not elsewhere classified   . COPD (chronic obstructive pulmonary disease) ([place])   . Dysphagia,  unspecified(787.20)   . Generalized convulsive epilepsy without mention of intractable epilepsy   . GERD (gastroesophageal reflux disease)   . Headache   . Hearing impaired   . Hyperlipidemia   . Hypersomnia with sleep apnea, unspecified   . Osteoporosis   . Oxygen desaturation during sleep    PRN;02 liters  . Paroxysmal atrial tachycardia ([place])   . PONV (postoperative nausea and vomiting)    " I had problems where my heart starts speeding and fluttering; it goes away by itself ."  . Pure hypercholesterolemia   . Reflux   . Rheumatic fever    as a child  . Seizures ([place])   . Shortness of breath 03/11/2010   while laying still  . Wears glasses   . Wears hearing aids     Past Surgical History:  Procedure Laterality Date  . ABDOMINAL HYSTERECTOMY    . ABLATION     x 3  . CAROTID ENDARTERECTOMY  11/11/2010   left with Dacron patch angioplasty  . CESAREAN SECTION     2 c-sections  . ENDARTERECTOMY Right 07/19/2016   Procedure: ENDARTERECTOMY RIGHT CAROTID ARTERY;  Surgeon: Angelia Mould, MD;  Location: [facility];  Service: Vascular;  Laterality: Right;  . HERNIA REPAIR    . PATCH ANGIOPLASTY Right 07/19/2016   Procedure: PATCH ANGIOPLASTY RIGHT CAROTID ARTERY USING  [place];  Surgeon: Angelia Mould, MD;  Location: [facility];  Service: Vascular;  Laterality: Right;  . SKIN CANCER EXCISION    . TONSILLECTOMY AND ADENOIDECTOMY      Allergies as of 02/03/2020      Reactions   Codeine Shortness Of Breath   Pravastatin Other (See Comments)   Give muscle aches and weakness    Contrast Media [iodinated Diagnostic Agents] Rash   Itching and rash   Metrizamide Rash   Itching and rash      Medication List    albuterol 108 (90 Base) MCG/ACT inhaler Commonly known as: VENTOLIN HFA Inhale 1-2 puffs into the lungs every 6 (six) hours as needed for wheezing or shortness of breath.   aspirin 81 MG tablet Take 81 mg by mouth 2 (two) times daily.     azithromycin 500 MG tablet Commonly known as: ZITHROMAX Take 500 mg by mouth daily.   ezetimibe 10 MG tablet Commonly known as: ZETIA Take 10 mg by mouth daily.   Flovent HFA 220 MCG/ACT inhaler Generic drug: fluticasone SMARTSIG:2 Puff(s) By Mouth Twice Daily   Garlic 123XX123 MG Caps Take by mouth.   loratadine 10 MG tablet Commonly known as: CLARITIN Take 10 mg by mouth daily.   LORazepam 0.5 MG tablet Commonly known as: ATIVAN Take 0.5 mg by mouth 3 (three) times daily.   predniSONE 10 MG tablet Commonly known as: DELTASONE Take 10 mg by mouth daily as needed (for bronchitis).   SPIRIVA HANDIHALER IN Inhale into the lungs.       Review of systems negative except as noted in HPI / PMHx or noted below:  Review of Systems  Constitutional: Negative.   HENT: Negative.   Eyes: Negative.   Respiratory: Negative.   Cardiovascular: Negative.   Gastrointestinal: Negative.   Genitourinary: Negative.   Musculoskeletal: Negative.   Skin: Negative.   Neurological: Negative.   Endo/Heme/Allergies: Negative.   Psychiatric/Behavioral: Negative.     Family History  Problem Relation Age of Onset  . Heart disease Mother        CHF  . Hyperlipidemia Mother   . Hypertension Mother   . Heart attack Mother   . Deep vein thrombosis Mother   . AAA (abdominal aortic aneurysm) Mother   . Heart disease Father   . Hyperlipidemia Father   . Hypertension Father   . Heart attack Father   . Asthma Father   . Cancer Sister   . Hyperlipidemia Sister   . Hypertension Sister   . Cancer Brother   . Hyperlipidemia Brother     Social History   Socioeconomic History  . Marital status: Married    Spouse name: Not on file  . Number of children: Not on file  . Years of education: Not on file  . Highest education level: Not on file  Occupational History  . Not on file  Tobacco Use  . Smoking status: Current Every Day Smoker    Packs/day: 2.00    Years: 30.00    Pack years:  60.00    Types: Cigarettes  . Smokeless tobacco: Never Used  . Tobacco comment: Down to 1-1.5 packs per day 06/30/16  Substance and Sexual Activity  . Alcohol use: No    Alcohol/week: 0.0 standard drinks  . Drug use: No  . Sexual activity: Not on file  Other Topics Concern  . Not on file  Social History Narrative  . Not on file    Environmental  and Social history  Lives in a house with a dry environment, no animals located inside the household, carpet in the bedroom, no plastic on the bed, no plastic on the pillow, actively smoking tobacco products.  Objective:   Vitals:   02/03/20 0944  BP: 128/82  Pulse: 86  Resp: 16  Temp: 97.9 F (36.6 C)  SpO2: 95%   Height: 5\' 2"  (157.5 cm) Weight: 129 lb 12.8 oz (58.9 kg)  Physical Exam Constitutional:      Appearance: She is not diaphoretic.     Comments: Coughing, throat clearing  HENT:     Head: Normocephalic.     Right Ear: Tympanic membrane, ear canal and external ear normal.     Left Ear: Tympanic membrane, ear canal and external ear normal.     Nose: Nose normal. No mucosal edema or rhinorrhea.     Mouth/Throat:     Pharynx: Uvula midline. No oropharyngeal exudate.  Eyes:     Conjunctiva/sclera: Conjunctivae normal.  Neck:     Thyroid: No thyromegaly.     Trachea: Trachea normal. No tracheal tenderness or tracheal deviation.  Cardiovascular:     Rate and Rhythm: Normal rate and regular rhythm.     Heart sounds: Normal heart sounds, S1 normal and S2 normal. No murmur.  Pulmonary:     Effort: No respiratory distress.     Breath sounds: Normal breath sounds. No stridor. No wheezing (Scattered inspiratory and expiratory wheezes all lung fields) or rales.  Lymphadenopathy:     Head:     Right side of head: No tonsillar adenopathy.     Left side of head: No tonsillar adenopathy.     Cervical: No cervical adenopathy.  Skin:    Findings: No erythema or rash.     Nails: There is no clubbing.  Neurological:      Mental Status: She is alert.     Diagnostics: Allergy skin tests were performed.  She did not demonstrate any hypersensitivity against a screening panel of aeroallergens or foods.  Spirometry was performed and demonstrated an FEV1 of 0.73 @ 40 % of predicted. FEV1/FVC = 0.56.  Following the administration of inhaled albuterol her FEV1 did not improve  Oxygen saturation on room air at rest was 95%.  Oxygen saturation on room air walking the hallway was 87%.  Oxygen saturation on 2 L nasal cannula oxygen at rest was 97%.  Oxygen saturation on 2 L nasal cannula oxygen while walking the hallway was 93%  Assessment and Plan:    1. COPD with asthma ([place])   2. Tobacco smoker, 20 cigarettes or fewer per day   3. Exercise hypoxemia   4. Gastroesophageal reflux disease, unspecified whether esophagitis present     1.  Allergen avoidance measures?  2. Use nicotine substitutes to replace tobacco smoke exposure  3.  Treat and prevent inflammation:   A. Trelegy 200 - 1 inhalation 1 time per day  B. Montelukast 10 mg - 1 tablet 1 time per day  4.  Treat and prevent reflux:   A.  Decrease caffeine consumption  B.  Pantoprazole 40 mg - 1 tablet 1 time per day  C. Can continue Carafate as needed  5.  Use oxygen 2 L nasal cannula during exertion  6.  Check a nocturnal oximetry study without oxygen  7.  If needed:   A.  Albuterol HFA -2 inhalations every 4-6 hours  8.  Return to clinic in 4 weeks or earlier if problem  Aki appears to have a inflamed airway and hopefully there is a reversible component regarding this rather significant airflow and oxygenation defect.  We will have her use oxygen during exercise and will measure whether or not she will require oxygen at night by obtaining a nocturnal oximetry study.  She will use a selection of anti-inflammatory agents for her airway and she needs to stop smoking obviously.  Her reflux is active and she will utilize the therapy noted above  to address this issue.  I will see her back in this clinic in 4 weeks or earlier if there is a problem.  Allena Katz, MD Allergy / Immunology [place]

## 2020-02-03 NOTE — Patient Instructions (Addendum)
  1.  Allergen avoidance measures?  2. Use nicotine substitutes to replace tobacco smoke exposure  3.  Treat and prevent inflammation:   A. Trelegy 200 - 1 inhalation 1 time per day  B. Montelukast 10 mg - 1 tablet 1 time per day  4.  Treat and prevent reflux:   A.  Decrease caffeine consumption  B.  Pantoprazole 40 mg - 1 tablet 1 time per day  C. Can continue Carafate as needed  5.  Use oxygen 2 L nasal cannula during exertion  6.  Check a nocturnal oximetry study without oxygen  7.  If needed:   A.  Albuterol HFA -2 inhalations every 4-6 hours  8.  Return to clinic in 4 weeks or earlier if problem

## 2020-02-04 ENCOUNTER — Encounter: Payer: Self-pay | Admitting: Allergy and Immunology

## 2020-02-04 DIAGNOSIS — Z86010 Personal history of colonic polyps: Secondary | ICD-10-CM | POA: Diagnosis not present

## 2020-02-04 DIAGNOSIS — Z800 Family history of malignant neoplasm of digestive organs: Secondary | ICD-10-CM | POA: Diagnosis not present

## 2020-02-04 DIAGNOSIS — K581 Irritable bowel syndrome with constipation: Secondary | ICD-10-CM | POA: Diagnosis not present

## 2020-02-04 DIAGNOSIS — R1011 Right upper quadrant pain: Secondary | ICD-10-CM | POA: Diagnosis not present

## 2020-02-05 ENCOUNTER — Telehealth: Payer: Self-pay

## 2020-02-05 NOTE — Telephone Encounter (Signed)
Talked to Sarah at [facility]) yesterday and they are able to supply oxygen for patient and do the overnight oximetry, however insurance will only pay for one provider to service the oxygen.  I talked with Dr.Kozlow and patient and we have decided to discontinue oxygen services from [facility] and switch everything to [facility] since they have a lighter more portable option for oxygen. I faxed a discontinue oxygen order to [facility] and have sent new order to [facility] for oxygen and for overnight oximetry test on room air.

## 2020-02-07 DIAGNOSIS — R1011 Right upper quadrant pain: Secondary | ICD-10-CM | POA: Diagnosis not present

## 2020-02-07 DIAGNOSIS — K7689 Other specified diseases of liver: Secondary | ICD-10-CM | POA: Diagnosis not present

## 2020-02-07 DIAGNOSIS — R932 Abnormal findings on diagnostic imaging of liver and biliary tract: Secondary | ICD-10-CM | POA: Diagnosis not present

## 2020-02-10 DIAGNOSIS — J449 Chronic obstructive pulmonary disease, unspecified: Secondary | ICD-10-CM | POA: Diagnosis not present

## 2020-02-11 DIAGNOSIS — R0602 Shortness of breath: Secondary | ICD-10-CM | POA: Diagnosis not present

## 2020-02-13 ENCOUNTER — Telehealth: Payer: Self-pay | Admitting: Allergy and Immunology

## 2020-02-13 NOTE — Telephone Encounter (Signed)
Please inform patient that her nocturnal oximetry study returns and she spent 22 minutes with low oxygen (SaO2 < 88%) while sleeping. Use the oxygen at 2 L [place] while sleeping. Obtain a repeat nocturnal oxygen study while using 2 L [place] O2.

## 2020-02-14 ENCOUNTER — Encounter: Payer: Self-pay | Admitting: *Deleted

## 2020-02-14 NOTE — Telephone Encounter (Signed)
Called patient and informed her of Dr. Bruna Potter message.  Told her I will send in order for nocturnal oxygen and repeat nocturnal oximetry order to [facility] and that they should be contacting her soon to set things up.  Patient agreeable with plan.

## 2020-02-20 DIAGNOSIS — I739 Peripheral vascular disease, unspecified: Secondary | ICD-10-CM | POA: Diagnosis not present

## 2020-02-20 DIAGNOSIS — I872 Venous insufficiency (chronic) (peripheral): Secondary | ICD-10-CM | POA: Diagnosis not present

## 2020-02-20 DIAGNOSIS — M1711 Unilateral primary osteoarthritis, right knee: Secondary | ICD-10-CM | POA: Diagnosis not present

## 2020-02-24 ENCOUNTER — Other Ambulatory Visit: Payer: Self-pay | Admitting: *Deleted

## 2020-02-24 DIAGNOSIS — I739 Peripheral vascular disease, unspecified: Secondary | ICD-10-CM

## 2020-02-24 DIAGNOSIS — Z9889 Other specified postprocedural states: Secondary | ICD-10-CM

## 2020-02-26 ENCOUNTER — Ambulatory Visit: Payer: Medicare HMO | Admitting: Vascular Surgery

## 2020-02-26 ENCOUNTER — Ambulatory Visit (INDEPENDENT_AMBULATORY_CARE_PROVIDER_SITE_OTHER)
Admission: RE | Admit: 2020-02-26 | Discharge: 2020-02-26 | Disposition: A | Discharge status: Home / Self Care | Payer: Medicare HMO | Source: Ambulatory Visit | Attending: Vascular Surgery | Admitting: Vascular Surgery

## 2020-02-26 ENCOUNTER — Ambulatory Visit (HOSPITAL_COMMUNITY)
Admission: RE | Admit: 2020-02-26 | Discharge: 2020-02-26 | Disposition: A | Discharge status: Home / Self Care | Payer: Medicare HMO | Source: Ambulatory Visit | Attending: Vascular Surgery | Admitting: Vascular Surgery

## 2020-02-26 ENCOUNTER — Encounter: Payer: Self-pay | Admitting: Vascular Surgery

## 2020-02-26 ENCOUNTER — Other Ambulatory Visit: Payer: Self-pay

## 2020-02-26 VITALS — BP 159/89 | HR 83 | Temp 98.0°F | Resp 20 | Ht 62.0 in | Wt 128.0 lb

## 2020-02-26 DIAGNOSIS — Z9889 Other specified postprocedural states: Secondary | ICD-10-CM | POA: Insufficient documentation

## 2020-02-26 DIAGNOSIS — I739 Peripheral vascular disease, unspecified: Secondary | ICD-10-CM

## 2020-02-26 NOTE — Progress Notes (Signed)
Patient name: [name] MRN: [id number] DOB: 1941/10/04 Sex: female  REASON FOR VISIT:   Follow-up of carotid disease and peripheral vascular disease  HPI:   [name] is a pleasant 79 y.o. female who I last saw 2 years ago.  She had a right carotid endarterectomy in September 2017.  She had a left carotid endarterectomy in January 2012.  Since I saw her last she denies any history of stroke, TIAs, expressive or receptive aphasia, or amaurosis fugax.  She describes some claudication in her legs but no rest pain.  She also has arthritis in her right knee.  She is on aspirin.  Past Medical History:  Diagnosis Date  . Allergy   . Anxiety   . Arthritis   . Asthma   . Bronchitis   . CAD (coronary artery disease)   . Cancer ([place])    skin cancer right arm  . Carotid artery occlusion   . Cataracts, bilateral   . CHF (congestive heart failure) ([place])   . Chronic airway obstruction, not elsewhere classified   . COPD (chronic obstructive pulmonary disease) ([place])   . Dysphagia, unspecified(787.20)   . Generalized convulsive epilepsy without mention of intractable epilepsy   . GERD (gastroesophageal reflux disease)   . Headache   . Hearing impaired   . Hyperlipidemia   . Hypersomnia with sleep apnea, unspecified   . Osteoporosis   . Oxygen desaturation during sleep    PRN;02 liters  . Paroxysmal atrial tachycardia ([place])   . PONV (postoperative nausea and vomiting)    " I had problems where my heart starts speeding and fluttering; it goes away by itself ."  . Pure hypercholesterolemia   . Reflux   . Rheumatic fever    as a child  . Seizures ([place])   . Shortness of breath 03/11/2010   while laying still  . Wears glasses   . Wears hearing aids     Family History  Problem Relation Age of Onset  . Heart disease Mother        CHF  . Hyperlipidemia Mother   . Hypertension Mother   . Heart attack Mother   . Deep vein thrombosis Mother   . AAA (abdominal aortic  aneurysm) Mother   . Heart disease Father   . Hyperlipidemia Father   . Hypertension Father   . Heart attack Father   . Asthma Father   . Cancer Sister   . Hyperlipidemia Sister   . Hypertension Sister   . Cancer Brother   . Hyperlipidemia Brother     SOCIAL HISTORY: Social History   Tobacco Use  . Smoking status: Current Every Day Smoker    Packs/day: 2.00    Years: 30.00    Pack years: 60.00    Types: Cigarettes  . Smokeless tobacco: Never Used  . Tobacco comment: Down to 1-1.5 packs per day 06/30/16  Substance Use Topics  . Alcohol use: No    Alcohol/week: 0.0 standard drinks    Allergies  Allergen Reactions  . Codeine Shortness Of Breath  . Pravastatin Other (See Comments)    Give muscle aches and weakness   . Contrast Media [Iodinated Diagnostic Agents] Rash    Itching and rash  . Metrizamide Rash    Itching and rash    Current Outpatient Medications  Medication Sig Dispense Refill  . albuterol (VENTOLIN HFA) 108 (90 Base) MCG/ACT inhaler 2 inhalations every 4-6 hrs 18 g 1  . aspirin 81 MG  tablet Take 81 mg by mouth 2 (two) times daily.      Marland Kitchen azithromycin (ZITHROMAX) 500 MG tablet Take 500 mg by mouth daily.    . Fluticasone-Umeclidin-Vilant (TRELEGY ELLIPTA) 200-62.5-25 MCG/INH AEPB Inhale 1 puff into the lungs daily. 60 each 5  . Garlic 123XX123 MG CAPS Take by mouth.    . loratadine (CLARITIN) 10 MG tablet Take 10 mg by mouth daily.    Marland Kitchen LORazepam (ATIVAN) 0.5 MG tablet Take 0.5 mg by mouth 3 (three) times daily.    . montelukast (SINGULAIR) 10 MG tablet Take 1 tablet (10 mg total) by mouth at bedtime. 30 tablet 5  . pantoprazole (PROTONIX) 40 MG tablet Take 1 tablet (40 mg total) by mouth daily. 30 tablet 5  . predniSONE (DELTASONE) 10 MG tablet Take 10 mg by mouth daily as needed (for bronchitis).     Marland Kitchen ezetimibe (ZETIA) 10 MG tablet Take 10 mg by mouth daily.    Marland Kitchen FLOVENT HFA 220 MCG/ACT inhaler SMARTSIG:2 Puff(s) By Mouth Twice Daily    . Tiotropium  Bromide Monohydrate (SPIRIVA HANDIHALER IN) Inhale into the lungs.     No current facility-administered medications for this visit.    REVIEW OF SYSTEMS:  [X]  denotes positive finding, [ ]  denotes negative finding Cardiac  Comments:  Chest pain or chest pressure:    Shortness of breath upon exertion:    Short of breath when lying flat:    Irregular heart rhythm:        Vascular    Pain in calf, thigh, or hip brought on by ambulation:    Pain in feet at night that wakes you up from your sleep:     Blood clot in your veins:    Leg swelling:  x       Pulmonary    Oxygen at home:    Productive cough:     Wheezing:         Neurologic    Sudden weakness in arms or legs:     Sudden numbness in arms or legs:     Sudden onset of difficulty speaking or slurred speech:    Temporary loss of vision in one eye:     Problems with dizziness:         Gastrointestinal    Blood in stool:     Vomited blood:         Genitourinary    Burning when urinating:     Blood in urine:        Psychiatric    Major depression:         Hematologic    Bleeding problems:    Problems with blood clotting too easily:        Skin    Rashes or ulcers:        Constitutional    Fever or chills:     PHYSICAL EXAM:   Vitals:   02/26/20 0856 02/26/20 0900  BP: (!) 162/101 (!) 159/89  Pulse: 83   Resp: 20   Temp: 98 F (36.7 C)   SpO2: 96%   Weight: 128 lb (58.1 kg)   Height: 5\' 2"  (1.575 m)     GENERAL: The patient is a well-nourished female, in no acute distress. The vital signs are documented above. CARDIAC: There is a regular rate and rhythm.  VASCULAR: I do not detect carotid bruits. She has palpable femoral pulses. I cannot palpate pedal pulses. She has no significant lower extremity swelling. PULMONARY: There  is good air exchange bilaterally without wheezing or rales. ABDOMEN: Soft and non-tender with normal pitched bowel sounds.  MUSCULOSKELETAL: There are no major deformities or  cyanosis. NEUROLOGIC: No focal weakness or paresthesias are detected. SKIN: There are no ulcers or rashes noted. PSYCHIATRIC: The patient has a normal affect.  DATA:    ARTERIAL DOPPLER STUDY: I have independently interpreted her arterial Doppler study today.  On the right side she has a monophasic posterior tibial signal with a triphasic dorsalis pedis signal.  ABI is 100%.  Toe pressure is 139 mmHg.  On the left side she has a triphasic posterior tibial signal with a biphasic dorsalis pedis signal.  ABI is 100%.  Toe pressure is 144 mmHg.  CAROTID DUPLEX: I have independently interpreted her carotid duplex scan today.    On the right side there is no evidence of recurrent stenosis.  There is a less than 39% stenosis.  The right vertebral artery is patent with antegrade flow.  On the left side there is a recurrent 60 to 79% stenosis at the lower end of that range in the proximal ICA.  The left vertebral artery is patent with antegrade flow.   MEDICAL ISSUES:   BILATERAL CAROTID DISEASE: This patient is undergone bilateral carotid endarterectomies.  She has a 60 to 79% recurrent carotid stenosis on the left at the lower end of that range.  She is asymptomatic.  She is on aspirin.  I have ordered a follow-up carotid duplex scan in 9 months and I will see her back at that time.  She knows to call sooner if she has problems.  She has no evidence of recurrent stenosis on the right.  PERIPHERAL VASCULAR DISEASE: On exam she does have evidence of infrainguinal arterial occlusive disease although her ABIs are pretty normal.  I suspect she has some calcific disease and these may be falsely elevated.  We discussed again the importance of tobacco cessation.  I encouraged her to stay as active as possible.  I will arrange for follow-up ABIs when I see her back in 9 months of her carotid duplex.  Deitra Mayo [facility] [phone]

## 2020-02-28 ENCOUNTER — Other Ambulatory Visit: Payer: Self-pay | Admitting: *Deleted

## 2020-02-28 DIAGNOSIS — Z9889 Other specified postprocedural states: Secondary | ICD-10-CM

## 2020-03-02 ENCOUNTER — Other Ambulatory Visit: Payer: Self-pay

## 2020-03-02 ENCOUNTER — Encounter: Payer: Self-pay | Admitting: Allergy and Immunology

## 2020-03-02 ENCOUNTER — Ambulatory Visit (INDEPENDENT_AMBULATORY_CARE_PROVIDER_SITE_OTHER): Payer: Medicare HMO | Admitting: Allergy and Immunology

## 2020-03-02 VITALS — BP 142/78 | HR 76 | Temp 97.8°F | Resp 22

## 2020-03-02 DIAGNOSIS — G4736 Sleep related hypoventilation in conditions classified elsewhere: Secondary | ICD-10-CM | POA: Diagnosis not present

## 2020-03-02 DIAGNOSIS — R0902 Hypoxemia: Secondary | ICD-10-CM

## 2020-03-02 DIAGNOSIS — K219 Gastro-esophageal reflux disease without esophagitis: Secondary | ICD-10-CM

## 2020-03-02 DIAGNOSIS — J449 Chronic obstructive pulmonary disease, unspecified: Secondary | ICD-10-CM | POA: Diagnosis not present

## 2020-03-02 DIAGNOSIS — F17210 Nicotine dependence, cigarettes, uncomplicated: Secondary | ICD-10-CM

## 2020-03-02 DIAGNOSIS — N3090 Cystitis, unspecified without hematuria: Secondary | ICD-10-CM

## 2020-03-02 DIAGNOSIS — F17200 Nicotine dependence, unspecified, uncomplicated: Secondary | ICD-10-CM

## 2020-03-02 DIAGNOSIS — R69 Illness, unspecified: Secondary | ICD-10-CM | POA: Diagnosis not present

## 2020-03-02 NOTE — Progress Notes (Signed)
[place] [facility] [place] [facility] [place]   Follow-up Note  Referring Provider: Nicoletta Dress, MD Primary Provider: Nicoletta Dress, MD Date of Office Visit: 03/02/2020  Subjective:   [name] (DOB: 07-Jul-1941) is a 79 y.o. female who returns to the [facility] on 03/02/2020 in re-evaluation of the following:  HPI: [name] returns to this clinic in reevaluation of COPD with asthma, active tobacco smoking, nocturnal and exercise-induced hypoxemia, and reflux addressed during her initial evaluation of 03 February 2020.  She states that she is "much better" regarding her breathing issue.  She is only using her oxygen intermittently when she exerts herself because the carrier system she is utilizing is a little bit too heavy.  She is not using any nocturnal oxygen because it created some type of smothering problem.  It should be noted that she is not using a bubble reservoir for her nocturnal oxygen.  Her use of albuterol is still a few times per day.  She has not been having any problems with her nose.  She still occasionally has reflux.  It might be a little bit better.  She has decreased her coffee consumption from a high of 6/day to 5/day.  She continues to smoke extensively.  Recently she has had urinary burning and a bad odor when she urinates.  Allergies as of 03/02/2020      Reactions   Codeine Shortness Of Breath   Pravastatin Other (See Comments)   Give muscle aches and weakness    Contrast Media [iodinated Diagnostic Agents] Rash   Itching and rash   Metrizamide Rash   Itching and rash      Medication List      albuterol 108 (90 Base) MCG/ACT inhaler Commonly known as: VENTOLIN HFA 2 inhalations every 4-6 hrs   aspirin 81 MG tablet Take 81 mg by mouth 2 (two) times daily.   azithromycin 500 MG tablet Commonly known as: ZITHROMAX Take 500 mg by mouth daily.   ezetimibe 10 MG tablet Commonly known as: ZETIA Take  10 mg by mouth daily.   Flovent HFA 220 MCG/ACT inhaler Generic drug: fluticasone SMARTSIG:2 Puff(s) By Mouth Twice Daily   Garlic 123XX123 MG Caps Take by mouth.   loratadine 10 MG tablet Commonly known as: CLARITIN Take 10 mg by mouth daily.   LORazepam 0.5 MG tablet Commonly known as: ATIVAN Take 0.5 mg by mouth 3 (three) times daily.   montelukast 10 MG tablet Commonly known as: SINGULAIR Take 1 tablet (10 mg total) by mouth at bedtime.   pantoprazole 40 MG tablet Commonly known as: PROTONIX Take 1 tablet (40 mg total) by mouth daily.   predniSONE 10 MG tablet Commonly known as: DELTASONE Take 10 mg by mouth daily as needed (for bronchitis).   SPIRIVA HANDIHALER IN Inhale into the lungs.   Trelegy Ellipta 200-62.5-25 MCG/INH Aepb Generic drug: Fluticasone-Umeclidin-Vilant Inhale 1 puff into the lungs daily.       Past Medical History:  Diagnosis Date  . Allergy   . Anxiety   . Arthritis   . Asthma   . Bronchitis   . CAD (coronary artery disease)   . Cancer ([place])    skin cancer right arm  . Carotid artery occlusion   . Cataracts, bilateral   . CHF (congestive heart failure) ([place])   . Chronic airway obstruction, not elsewhere classified   . COPD (chronic obstructive pulmonary disease) ([place])   . Dysphagia, unspecified(787.20)   .  Generalized convulsive epilepsy without mention of intractable epilepsy   . GERD (gastroesophageal reflux disease)   . Headache   . Hearing impaired   . Hyperlipidemia   . Hypersomnia with sleep apnea, unspecified   . Osteoporosis   . Oxygen desaturation during sleep    PRN;02 liters  . Paroxysmal atrial tachycardia ([place])   . PONV (postoperative nausea and vomiting)    " I had problems where my heart starts speeding and fluttering; it goes away by itself ."  . Pure hypercholesterolemia   . Reflux   . Rheumatic fever    as a child  . Seizures ([place])   . Shortness of breath 03/11/2010   while laying still  . Wears glasses     . Wears hearing aids     Past Surgical History:  Procedure Laterality Date  . ABDOMINAL HYSTERECTOMY    . ABLATION     x 3  . CAROTID ENDARTERECTOMY  11/11/2010   left with Dacron patch angioplasty  . CESAREAN SECTION     2 c-sections  . ENDARTERECTOMY Right 07/19/2016   Procedure: ENDARTERECTOMY RIGHT CAROTID ARTERY;  Surgeon: Angelia Mould, MD;  Location: [facility];  Service: Vascular;  Laterality: Right;  . HERNIA REPAIR    . PATCH ANGIOPLASTY Right 07/19/2016   Procedure: PATCH ANGIOPLASTY RIGHT CAROTID ARTERY USING HEMASHIELD PLATINUM FINESSE PATCH;  Surgeon: Angelia Mould, MD;  Location: [facility];  Service: Vascular;  Laterality: Right;  . SKIN CANCER EXCISION    . TONSILLECTOMY AND ADENOIDECTOMY      Review of systems negative except as noted in HPI / PMHx or noted below:  Review of Systems  Constitutional: Negative.   HENT: Negative.   Eyes: Negative.   Respiratory: Negative.   Cardiovascular: Negative.   Gastrointestinal: Negative.   Genitourinary: Negative.   Musculoskeletal: Negative.   Skin: Negative.   Neurological: Negative.   Endo/Heme/Allergies: Negative.   Psychiatric/Behavioral: Negative.      Objective:   Vitals:   03/02/20 1116  BP: (!) 142/78  Pulse: 76  Resp: (!) 22  Temp: 97.8 F (36.6 C)  SpO2: 94%          Physical Exam Constitutional:      Appearance: She is not diaphoretic.  HENT:     Head: Normocephalic.     Right Ear: Tympanic membrane, ear canal and external ear normal.     Left Ear: Tympanic membrane, ear canal and external ear normal.     Nose: Nose normal. No mucosal edema or rhinorrhea.     Mouth/Throat:     Pharynx: Uvula midline. No oropharyngeal exudate.  Eyes:     Conjunctiva/sclera: Conjunctivae normal.  Neck:     Thyroid: No thyromegaly.     Trachea: Trachea normal. No tracheal tenderness or tracheal deviation.  Cardiovascular:     Rate and Rhythm: Normal rate and regular rhythm.     Heart sounds:  Normal heart sounds, S1 normal and S2 normal. No murmur.  Pulmonary:     Effort: No respiratory distress.     Breath sounds: Normal breath sounds. No stridor. No wheezing (Bilateral expiratory wheezing all lung fields) or rales.  Lymphadenopathy:     Head:     Right side of head: No tonsillar adenopathy.     Left side of head: No tonsillar adenopathy.     Cervical: No cervical adenopathy.  Skin:    Findings: No erythema or rash.     Nails: There is no clubbing.  Neurological:     Mental Status: She is alert.     Diagnostics:    Spirometry was performed and demonstrated an FEV1 of 0.92 at 50 % of predicted.  Results of a nocturnal oximetry study obtained 11 February 2020 identified 22.3 minutes spent with an oxygen saturation below 88% with a oxygen nadir of 82%, 150 desaturation events with a total recording time of 9 hours on room air.  Assessment and Plan:   1. COPD with asthma ([place])   2. Tobacco smoker, 20 cigarettes or fewer per day   3. Exercise hypoxemia   4. 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 Gastroesophageal reflux disease, unspecified whether esophagitis present   6. Bladder infection     1.  Visit with primary care doctor about possible bladder infection  2.  Use nicotine substitutes to replace tobacco smoke exposure  3.  Continue to treat and prevent inflammation:   A. Trelegy 200 - 1 inhalation 1 time per day  B. Montelukast 10 mg - 1 tablet 1 time per day  4.  Continue to treat and prevent reflux:   A.  Decrease caffeine consumption  B.  Pantoprazole 40 mg - 1 tablet 1 time per day  C. Can continue Carafate as needed  5.  Use oxygen 2 L nasal cannula during exertion and at bedtime.  Contact [facility] patient about obtaining a bubble reservoir to add moisture to oxygen at night  6.  Check a nocturnal oximetry study with oxygen  7.  If needed:   A.  Albuterol HFA -2 inhalations every 4-6 hours  8.  Return to clinic  in 12 weeks or earlier if problem  Danialle appears to be better with her current medical problems while consistently using anti-inflammatory agents for her airway and therapy directed against reflux.  I had a long discussion with her today about the need to eliminate tobacco smoke exposure and to consistently use her oxygen especially at nighttime.  Will try to get her a bubble reservoir to add moisture back into the oxygen as this dry oxygen appears to be disturbing her while she sleeps.  She needs to follow-up with her primary care doctor about her apparent bladder infection.  I will see her back in this clinic in 12 weeks or earlier if there is a problem.  Allena Katz, MD Allergy / Immunology [place]

## 2020-03-02 NOTE — Patient Instructions (Addendum)
  1.  Visit with primary care doctor about possible bladder infection  2.  Use nicotine substitutes to replace tobacco smoke exposure  3.  Continue to treat and prevent inflammation:   A. Trelegy 200 - 1 inhalation 1 time per day  B. Montelukast 10 mg - 1 tablet 1 time per day  4.  Continue to treat and prevent reflux:   A.  Decrease caffeine consumption  B.  Pantoprazole 40 mg - 1 tablet 1 time per day  C. Can continue Carafate as needed  5.  Use oxygen 2 L nasal cannula during exertion and at bedtime.  Contact [facility] patient about obtaining a bubble reservoir to add moisture to oxygen at night  6.  Check a nocturnal oximetry study with oxygen  7.  If needed:   A.  Albuterol HFA -2 inhalations every 4-6 hours  8.  Return to clinic in 12 weeks or earlier if problem

## 2020-03-03 ENCOUNTER — Encounter: Payer: Self-pay | Admitting: Allergy and Immunology

## 2020-03-05 DIAGNOSIS — R0600 Dyspnea, unspecified: Secondary | ICD-10-CM | POA: Diagnosis not present

## 2020-03-06 DIAGNOSIS — J440 Chronic obstructive pulmonary disease with acute lower respiratory infection: Secondary | ICD-10-CM | POA: Diagnosis not present

## 2020-03-06 DIAGNOSIS — R32 Unspecified urinary incontinence: Secondary | ICD-10-CM | POA: Diagnosis not present

## 2020-03-06 DIAGNOSIS — J209 Acute bronchitis, unspecified: Secondary | ICD-10-CM | POA: Diagnosis not present

## 2020-03-11 ENCOUNTER — Telehealth: Payer: Self-pay | Admitting: *Deleted

## 2020-03-11 DIAGNOSIS — J449 Chronic obstructive pulmonary disease, unspecified: Secondary | ICD-10-CM | POA: Diagnosis not present

## 2020-03-11 NOTE — Telephone Encounter (Signed)
Spoke with [name] about her Nocturnal Oximetry results. She states that she thought she turned her O2 on before going to bed the night of the test but she discovered the next morning it was off.

## 2020-03-11 NOTE — Telephone Encounter (Signed)
Repeat test with oxygen on

## 2020-03-13 NOTE — Telephone Encounter (Signed)
Talked with Gerald Stabs from [facility] Patient and he said repeat test can be done in one month in order for insurance to cover test.  Will send in new order for overnight oximetry on oxygen in one month.  Called and advised patient of plan. She did say that [facility] had set up the bubble reservoir and this has been helpful.

## 2020-03-17 DIAGNOSIS — N952 Postmenopausal atrophic vaginitis: Secondary | ICD-10-CM | POA: Diagnosis not present

## 2020-03-17 DIAGNOSIS — Z79899 Other long term (current) drug therapy: Secondary | ICD-10-CM | POA: Diagnosis not present

## 2020-03-17 DIAGNOSIS — N3946 Mixed incontinence: Secondary | ICD-10-CM | POA: Diagnosis not present

## 2020-03-22 DIAGNOSIS — J449 Chronic obstructive pulmonary disease, unspecified: Secondary | ICD-10-CM | POA: Diagnosis not present

## 2020-04-01 ENCOUNTER — Encounter: Payer: Self-pay | Admitting: Allergy and Immunology

## 2020-04-01 DIAGNOSIS — M1711 Unilateral primary osteoarthritis, right knee: Secondary | ICD-10-CM | POA: Diagnosis not present

## 2020-04-08 ENCOUNTER — Telehealth: Payer: Self-pay | Admitting: *Deleted

## 2020-04-08 MED ORDER — AEROCHAMBER PLUS MISC: 2 refills | Status: DC

## 2020-04-08 MED ORDER — BREZTRI AEROSPHERE 160-9-4.8 MCG/ACT IN AERO: INHALATION_SPRAY | RESPIRATORY_TRACT | 5 refills | Status: DC

## 2020-04-08 MED ORDER — NYSTATIN 100000 UNIT/ML MT SUSP: OROMUCOSAL | 0 refills | Status: DC

## 2020-04-08 NOTE — Addendum Note (Signed)
Addended by: Zandra Abts on: 04/08/2020 05:29 PM   Modules accepted: Orders

## 2020-04-08 NOTE — Telephone Encounter (Signed)
[name] calls stating that the Trelegy has been causing her throat to become sore over the last week. I confirmed that she is rinsing, gargling, and spitting after each use. Please advise.

## 2020-04-08 NOTE — Telephone Encounter (Signed)
Lets switch her to [facility] - 2 inhalations 2 times per day with spacer. And have her use nystatin oral solution - 5 mls swish and swallow 3 times a day for the next 5 days

## 2020-04-08 NOTE — Telephone Encounter (Signed)
Talked with patient.  It has been about a month since her last overnight oximetry(ONO) and I told her we could try to repeat the ONO with oxygen now since she accidentally did not use it during the last ONO.  Patient would like to wait at this time due to cost of the ONO, which was $75.  She also wants to try the [facility] first and see how she does on it.  I told her I will plan to check up on her next week and see if she is ready to order the ONO.

## 2020-04-08 NOTE — Telephone Encounter (Signed)
Patient informed of new medications.  Wink sent to [facility].  Patient does still have Spiriva which she uses from time to time.  I told her to hold off on the Spiriva right now as the Judithann Sauger has a similar ingredient in it.

## 2020-04-09 ENCOUNTER — Telehealth: Payer: Self-pay

## 2020-04-10 NOTE — Telephone Encounter (Signed)
Talked with patient and let her know that Dr. Neldon Mc would like to refer her to [facility] Pulmonology for her COPD.  She does not want to do that at this time, but would like to try the [facility] and see how she does on that. I told her to think about the referral and let us know if she would like to proceed with scheduling.

## 2020-04-11 DIAGNOSIS — J449 Chronic obstructive pulmonary disease, unspecified: Secondary | ICD-10-CM | POA: Diagnosis not present

## 2020-05-11 DIAGNOSIS — J449 Chronic obstructive pulmonary disease, unspecified: Secondary | ICD-10-CM | POA: Diagnosis not present

## 2020-05-22 DIAGNOSIS — Z1231 Encounter for screening mammogram for malignant neoplasm of breast: Secondary | ICD-10-CM | POA: Diagnosis not present

## 2020-05-22 DIAGNOSIS — N959 Unspecified menopausal and perimenopausal disorder: Secondary | ICD-10-CM | POA: Diagnosis not present

## 2020-05-22 DIAGNOSIS — M810 Age-related osteoporosis without current pathological fracture: Secondary | ICD-10-CM | POA: Diagnosis not present

## 2020-05-22 DIAGNOSIS — M85832 Other specified disorders of bone density and structure, left forearm: Secondary | ICD-10-CM | POA: Diagnosis not present

## 2020-05-25 ENCOUNTER — Other Ambulatory Visit: Payer: Self-pay

## 2020-05-25 ENCOUNTER — Telehealth: Payer: Self-pay

## 2020-05-25 ENCOUNTER — Encounter: Payer: Self-pay | Admitting: Allergy and Immunology

## 2020-05-25 ENCOUNTER — Ambulatory Visit (INDEPENDENT_AMBULATORY_CARE_PROVIDER_SITE_OTHER): Payer: Medicare HMO | Admitting: Allergy and Immunology

## 2020-05-25 VITALS — BP 152/92 | HR 72 | Resp 24 | Ht 60.0 in | Wt 129.0 lb

## 2020-05-25 DIAGNOSIS — K219 Gastro-esophageal reflux disease without esophagitis: Secondary | ICD-10-CM | POA: Diagnosis not present

## 2020-05-25 DIAGNOSIS — F17200 Nicotine dependence, unspecified, uncomplicated: Secondary | ICD-10-CM | POA: Diagnosis not present

## 2020-05-25 DIAGNOSIS — J449 Chronic obstructive pulmonary disease, unspecified: Secondary | ICD-10-CM

## 2020-05-25 DIAGNOSIS — R0902 Hypoxemia: Secondary | ICD-10-CM

## 2020-05-25 DIAGNOSIS — G4736 Sleep related hypoventilation in conditions classified elsewhere: Secondary | ICD-10-CM

## 2020-05-25 DIAGNOSIS — F17210 Nicotine dependence, cigarettes, uncomplicated: Secondary | ICD-10-CM

## 2020-05-25 DIAGNOSIS — R69 Illness, unspecified: Secondary | ICD-10-CM | POA: Diagnosis not present

## 2020-05-25 NOTE — Telephone Encounter (Signed)
[facility] and talked with Gerald Stabs regarding obtaining a lighter weight portable oxygen unit for [place].  He is going to work on getting her something lighter and will let us know if he needs anything else from [place] on our end.

## 2020-05-25 NOTE — Patient Instructions (Addendum)
  1.  Use nicotine substitutes to replace tobacco smoke exposure  2.  Continue to treat and prevent inflammation:   A. Trelegy 200 - 1 inhalation 1 time per day  B. Montelukast 10 mg - 1 tablet 1 time per day  3.  Continue to treat and prevent reflux:   A.  Decrease caffeine consumption  B.  Pantoprazole 40 mg - 1 tablet 1 time per day  C.  Can continue Carafate as needed  5.  Use oxygen 2 L nasal cannula during exertion and at bedtime.    6. Obtain fall flu vaccine  7. Contact Dr. Melina Copa about repeat colonoscopy  8.  If needed:   A.  Albuterol HFA -2 inhalations every 4-6 hours  8.  Return to clinic in December 2021 or earlier if problem
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
## 2020-05-26 ENCOUNTER — Encounter: Payer: Self-pay | Admitting: Allergy and Immunology

## 2020-06-11 ENCOUNTER — Other Ambulatory Visit: Payer: Self-pay | Admitting: Allergy and Immunology

## 2020-06-11 DIAGNOSIS — J449 Chronic obstructive pulmonary disease, unspecified: Secondary | ICD-10-CM | POA: Diagnosis not present

## 2020-06-24 DIAGNOSIS — J449 Chronic obstructive pulmonary disease, unspecified: Secondary | ICD-10-CM | POA: Diagnosis not present

## 2020-07-12 DIAGNOSIS — J449 Chronic obstructive pulmonary disease, unspecified: Secondary | ICD-10-CM | POA: Diagnosis not present

## 2020-07-24 DIAGNOSIS — J449 Chronic obstructive pulmonary disease, unspecified: Secondary | ICD-10-CM | POA: Diagnosis not present

## 2020-08-11 DIAGNOSIS — J449 Chronic obstructive pulmonary disease, unspecified: Secondary | ICD-10-CM | POA: Diagnosis not present

## 2020-08-24 DIAGNOSIS — J449 Chronic obstructive pulmonary disease, unspecified: Secondary | ICD-10-CM | POA: Diagnosis not present

## 2020-08-27 ENCOUNTER — Other Ambulatory Visit: Payer: Self-pay | Admitting: Allergy and Immunology

## 2020-09-11 DIAGNOSIS — J449 Chronic obstructive pulmonary disease, unspecified: Secondary | ICD-10-CM | POA: Diagnosis not present

## 2020-09-23 DIAGNOSIS — J449 Chronic obstructive pulmonary disease, unspecified: Secondary | ICD-10-CM | POA: Diagnosis not present

## 2020-09-28 DIAGNOSIS — H11152 Pinguecula, left eye: Secondary | ICD-10-CM | POA: Diagnosis not present

## 2020-09-28 DIAGNOSIS — H524 Presbyopia: Secondary | ICD-10-CM | POA: Diagnosis not present

## 2020-09-28 DIAGNOSIS — H25012 Cortical age-related cataract, left eye: Secondary | ICD-10-CM | POA: Diagnosis not present

## 2020-09-28 DIAGNOSIS — H40023 Open angle with borderline findings, high risk, bilateral: Secondary | ICD-10-CM | POA: Diagnosis not present

## 2020-09-28 DIAGNOSIS — H2513 Age-related nuclear cataract, bilateral: Secondary | ICD-10-CM | POA: Diagnosis not present

## 2020-09-28 DIAGNOSIS — H18413 Arcus senilis, bilateral: Secondary | ICD-10-CM | POA: Diagnosis not present

## 2020-10-01 ENCOUNTER — Encounter: Payer: Self-pay | Admitting: Allergy and Immunology

## 2020-10-01 ENCOUNTER — Ambulatory Visit (INDEPENDENT_AMBULATORY_CARE_PROVIDER_SITE_OTHER): Payer: Medicare HMO | Admitting: Allergy and Immunology

## 2020-10-01 ENCOUNTER — Other Ambulatory Visit: Payer: Self-pay

## 2020-10-01 VITALS — BP 164/78 | HR 68 | Resp 24 | Ht 59.8 in | Wt 128.6 lb

## 2020-10-01 DIAGNOSIS — R0902 Hypoxemia: Secondary | ICD-10-CM

## 2020-10-01 DIAGNOSIS — F17210 Nicotine dependence, cigarettes, uncomplicated: Secondary | ICD-10-CM | POA: Diagnosis not present

## 2020-10-01 DIAGNOSIS — R69 Illness, unspecified: Secondary | ICD-10-CM | POA: Diagnosis not present

## 2020-10-01 DIAGNOSIS — K219 Gastro-esophageal reflux disease without esophagitis: Secondary | ICD-10-CM | POA: Diagnosis not present

## 2020-10-01 DIAGNOSIS — J449 Chronic obstructive pulmonary disease, unspecified: Secondary | ICD-10-CM

## 2020-10-01 DIAGNOSIS — G4736 Sleep related hypoventilation in conditions classified elsewhere: Secondary | ICD-10-CM | POA: Diagnosis not present

## 2020-10-01 NOTE — Patient Instructions (Addendum)
  1.  Use nicotine substitutes to replace tobacco smoke exposure  2.  Continue to treat and prevent inflammation:   A. Trelegy 200 - 1 inhalation 1 time per day  B. Montelukast 10 mg - 1 tablet 1 time per day  3.  Continue to treat and prevent reflux:   A. Pantoprazole 40 mg - 1 tablet 1 time per day  4.  Use oxygen 2 L nasal cannula during exertion and at bedtime.    5. Obtain fall flu vaccine and Covid booster  6.  If needed:   A.  Albuterol HFA -2 inhalations every 4-6 hours  7.  Return to clinic in 6 months or earlier if problem

## 2020-10-01 NOTE — Progress Notes (Signed)
[place] [facility] [place] [facility] [place]   Follow-up Note  Referring Provider: Nicoletta Dress, MD Primary Provider: Nicoletta Dress, MD Date of Office Visit: 10/01/2020  Subjective:   [name] (DOB: Jun 10, 1941) is a 79 y.o. female who returns to the [facility] on 10/01/2020 in re-evaluation of the following:  HPI: Renu returns to this clinic in evaluation of COPD with asthma, active tobacco smoking, nocturnal and exercise-induced hypoxemia, and a history of reflux.  I have not seen her in this clinic since 25 May 2020.  She thinks that she is doing very well.  Very well for [place] means that she has chronic cough both during the daytime and nighttime and uses a short acting bronchodilator usually when she exerts herself.  She has not required a steroid or an antibiotic to treat any type of airway issue.  She is not using her oxygen at nighttime.  She is not using her oxygen when she exerts herself.  She uses her oxygen "when she needs it".  She believes that her reflux is doing relatively well at this point in time while using a proton pump inhibitor.  Allergies as of 10/01/2020      Reactions   Codeine Shortness Of Breath   Pravastatin Other (See Comments)   Give muscle aches and weakness    Contrast Media [iodinated Diagnostic Agents] Rash   Itching and rash   Metrizamide Rash   Itching and rash      Medication List      AeroChamber Plus inhaler Use as directed with inhaler.   albuterol 108 (90 Base) MCG/ACT inhaler Commonly known as: VENTOLIN HFA 2 inhalations every 4-6 hrs   alendronate 70 MG tablet Commonly known as: FOSAMAX Take 70 mg by mouth once a week.   aspirin 81 MG tablet Take 81 mg by mouth 2 (two) times daily.   azithromycin 500 MG tablet Commonly known as: ZITHROMAX Take 500 mg by mouth daily.   dicyclomine 10 MG capsule Commonly known as: BENTYL SMARTSIG:1 Capsule(s) By Mouth 1 to 3 Times  Daily PRN   Garlic 2993 MG Caps Take by mouth.   LORazepam 0.5 MG tablet Commonly known as: ATIVAN Take 0.5 mg by mouth 3 (three) times daily.   meloxicam 15 MG tablet Commonly known as: MOBIC Take 15 mg by mouth daily.   montelukast 10 MG tablet Commonly known as: SINGULAIR TAKE 1 TABLET BY MOUTH AT BEDTIME   pantoprazole 40 MG tablet Commonly known as: PROTONIX Take 1 tablet by mouth once daily   polyethylene glycol powder 17 GM/SCOOP powder Commonly known as: GLYCOLAX/MIRALAX Take 17 g by mouth daily.   sucralfate 1 g tablet Commonly known as: CARAFATE Take 1 g by mouth 2 (two) times daily.   Trelegy Ellipta 200-62.5-25 MCG/INH Aepb Generic drug: Fluticasone-Umeclidin-Vilant INHALE 1 PUFF ONCE DAILY   VITAMIN D PO Take by mouth.   vitamin E 180 MG (400 UNITS) capsule Generic drug: vitamin E Take 400 Units by mouth daily.       Past Medical History:  Diagnosis Date  . Allergy   . Anxiety   . Arthritis   . Asthma   . Bronchitis   . CAD (coronary artery disease)   . Cancer ([place])    skin cancer right arm  . Carotid artery occlusion   . Cataracts, bilateral   . CHF (congestive heart failure) ([place])   . Chronic airway obstruction, not elsewhere classified   .  COPD (chronic obstructive pulmonary disease) ([place])   . Dysphagia, unspecified(787.20)   . Generalized convulsive epilepsy without mention of intractable epilepsy   . GERD (gastroesophageal reflux disease)   . Headache   . Hearing impaired   . Hyperlipidemia   . Hypersomnia with sleep apnea, unspecified   . Osteoporosis   . Oxygen desaturation during sleep    PRN;02 liters  . Paroxysmal atrial tachycardia ([place])   . PONV (postoperative nausea and vomiting)    " I had problems where my heart starts speeding and fluttering; it goes away by itself ."  . Pure hypercholesterolemia   . Reflux   . Rheumatic fever    as a child  . Seizures ([place])   . Shortness of breath 03/11/2010   while laying still   . Wears glasses   . Wears hearing aids     Past Surgical History:  Procedure Laterality Date  . ABDOMINAL HYSTERECTOMY    . ABLATION     x 3  . CAROTID ENDARTERECTOMY  11/11/2010   left with Dacron patch angioplasty  . CESAREAN SECTION     2 c-sections  . ENDARTERECTOMY Right 07/19/2016   Procedure: ENDARTERECTOMY RIGHT CAROTID ARTERY;  Surgeon: Angelia Mould, MD;  Location: [facility];  Service: Vascular;  Laterality: Right;  . HERNIA REPAIR    . PATCH ANGIOPLASTY Right 07/19/2016   Procedure: PATCH ANGIOPLASTY RIGHT CAROTID ARTERY USING HEMASHIELD PLATINUM FINESSE PATCH;  Surgeon: Angelia Mould, MD;  Location: [facility];  Service: Vascular;  Laterality: Right;  . SKIN CANCER EXCISION    . TONSILLECTOMY AND ADENOIDECTOMY      Review of systems negative except as noted in HPI / PMHx or noted below:  Review of Systems  Constitutional: Negative.   HENT: Negative.   Eyes: Negative.   Respiratory: Negative.   Cardiovascular: Negative.   Gastrointestinal: Negative.   Genitourinary: Negative.   Musculoskeletal: Negative.   Skin: Negative.   Neurological: Negative.   Endo/Heme/Allergies: Negative.   Psychiatric/Behavioral: Negative.      Objective:   Vitals:   10/01/20 1100 10/01/20 1112  BP: (!) 150/82 (!) 164/78  Pulse: 68   Resp: (!) 24   SpO2: 98%    Height: 4' 11.8" (151.9 cm)  Weight: 128 lb 9.6 oz (58.3 kg)   Physical Exam Constitutional:      Appearance: She is not diaphoretic.  HENT:     Head: Normocephalic.     Right Ear: Tympanic membrane, ear canal and external ear normal.     Left Ear: Tympanic membrane, ear canal and external ear normal.     Nose: Nose normal. No mucosal edema or rhinorrhea.     Mouth/Throat:     Mouth: Oropharynx is clear and moist and mucous membranes are normal.     Pharynx: Uvula midline. No oropharyngeal exudate.  Eyes:     Conjunctiva/sclera: Conjunctivae normal.  Neck:     Thyroid: No thyromegaly.     Trachea:  Trachea normal. No tracheal tenderness or tracheal deviation.  Cardiovascular:     Rate and Rhythm: Normal rate and regular rhythm.     Heart sounds: Normal heart sounds, S1 normal and S2 normal. No murmur heard.   Pulmonary:     Effort: No respiratory distress.     Breath sounds: Normal breath sounds. No stridor. No wheezing (Widespread expiratory wheezes all lung fields) or rales.  Musculoskeletal:        General: No edema.  Lymphadenopathy:  Head:     Right side of head: No tonsillar adenopathy.     Left side of head: No tonsillar adenopathy.     Cervical: No cervical adenopathy.  Skin:    Findings: No erythema or rash.     Nails: There is no clubbing.  Neurological:     Mental Status: She is alert.     Diagnostics:    Spirometry was performed and demonstrated an FEV1 of 0.79 at 48 % of predicted.  Assessment and Plan:   1. COPD with asthma ([place])   2. 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 Exercise hypoxemia   4. Tobacco smoker, 20 cigarettes or fewer per day   5. Gastroesophageal reflux disease, unspecified whether esophagitis present     1.  Use nicotine substitutes to replace tobacco smoke exposure  2.  Continue to treat and prevent inflammation:   A. Trelegy 200 - 1 inhalation 1 time per day  B. Montelukast 10 mg - 1 tablet 1 time per day  3.  Continue to treat and prevent reflux:   A. Pantoprazole 40 mg - 1 tablet 1 time per day  4.  Use oxygen 2 L nasal cannula during exertion and at bedtime.    5. Obtain fall flu vaccine and Covid booster  6.  If needed:   A.  Albuterol HFA -2 inhalations every 4-6 hours  7.  Return to clinic in 6 months or earlier if problem  Margerite is destroying her lungs with tobacco smoke exposure and within some period of time she is going to have an airway that will not be able to exchange gases.  I have encouraged her to once again find a nicotine substitute to replace her tobacco smoke  exposure.  We will keep her on therapy directed against respiratory tract inflammation and against reflux as noted above.  I have encouraged her to use her oxygen while she sleeps and also whenever she exerts herself.  I will see her back in this clinic in 6 months or earlier if there is a problem.  Allena Katz, MD Allergy / Immunology [place]

## 2020-10-05 ENCOUNTER — Encounter: Payer: Self-pay | Admitting: Allergy and Immunology

## 2020-10-11 DIAGNOSIS — J449 Chronic obstructive pulmonary disease, unspecified: Secondary | ICD-10-CM | POA: Diagnosis not present

## 2020-10-24 DIAGNOSIS — J449 Chronic obstructive pulmonary disease, unspecified: Secondary | ICD-10-CM | POA: Diagnosis not present

## 2020-10-29 ENCOUNTER — Telehealth: Payer: Self-pay | Admitting: *Deleted

## 2020-10-29 NOTE — Telephone Encounter (Signed)
Approvedtoday Your request has been approved  This approval authorizes your coverage from 10/24/2020 - 10/23/2021

## 2020-10-29 NOTE — Telephone Encounter (Signed)
Received PA requets for [facility]- submitted through cover meds key # [id number] waiting for determination.   Your information has been submitted to [employer]. [employer] will review the request and will issue a decision, typically within 1-3 days from your submission. You can check the updated outcome later by reopening this request.  If [employer] has not responded in 1-3 days or if you have any questions about your ePA request, please contact [employer] at [phone]. If you think there may be a problem with your PA request, use our live chat feature at the bottom right.

## 2020-11-11 DIAGNOSIS — J449 Chronic obstructive pulmonary disease, unspecified: Secondary | ICD-10-CM | POA: Diagnosis not present

## 2020-11-24 DIAGNOSIS — J449 Chronic obstructive pulmonary disease, unspecified: Secondary | ICD-10-CM | POA: Diagnosis not present

## 2020-11-26 DIAGNOSIS — R69 Illness, unspecified: Secondary | ICD-10-CM | POA: Diagnosis not present

## 2020-11-26 DIAGNOSIS — J209 Acute bronchitis, unspecified: Secondary | ICD-10-CM | POA: Diagnosis not present

## 2020-11-26 DIAGNOSIS — J440 Chronic obstructive pulmonary disease with acute lower respiratory infection: Secondary | ICD-10-CM | POA: Diagnosis not present

## 2020-12-12 DIAGNOSIS — J449 Chronic obstructive pulmonary disease, unspecified: Secondary | ICD-10-CM | POA: Diagnosis not present

## 2020-12-22 DIAGNOSIS — J449 Chronic obstructive pulmonary disease, unspecified: Secondary | ICD-10-CM | POA: Diagnosis not present

## 2020-12-25 DIAGNOSIS — J440 Chronic obstructive pulmonary disease with acute lower respiratory infection: Secondary | ICD-10-CM | POA: Diagnosis not present

## 2020-12-25 DIAGNOSIS — J449 Chronic obstructive pulmonary disease, unspecified: Secondary | ICD-10-CM | POA: Diagnosis not present

## 2020-12-25 DIAGNOSIS — S2232XA Fracture of one rib, left side, initial encounter for closed fracture: Secondary | ICD-10-CM | POA: Diagnosis not present

## 2020-12-25 DIAGNOSIS — I10 Essential (primary) hypertension: Secondary | ICD-10-CM | POA: Diagnosis not present

## 2020-12-25 DIAGNOSIS — Z20828 Contact with and (suspected) exposure to other viral communicable diseases: Secondary | ICD-10-CM | POA: Diagnosis not present

## 2020-12-25 DIAGNOSIS — I4891 Unspecified atrial fibrillation: Secondary | ICD-10-CM | POA: Diagnosis not present

## 2020-12-25 DIAGNOSIS — N2889 Other specified disorders of kidney and ureter: Secondary | ICD-10-CM | POA: Diagnosis not present

## 2020-12-25 DIAGNOSIS — I340 Nonrheumatic mitral (valve) insufficiency: Secondary | ICD-10-CM | POA: Diagnosis not present

## 2020-12-25 DIAGNOSIS — Y92239 Unspecified place in hospital as the place of occurrence of the external cause: Secondary | ICD-10-CM | POA: Diagnosis not present

## 2020-12-25 DIAGNOSIS — R0602 Shortness of breath: Secondary | ICD-10-CM | POA: Diagnosis not present

## 2020-12-25 DIAGNOSIS — I160 Hypertensive urgency: Secondary | ICD-10-CM | POA: Diagnosis not present

## 2020-12-25 DIAGNOSIS — J441 Chronic obstructive pulmonary disease with (acute) exacerbation: Secondary | ICD-10-CM | POA: Diagnosis not present

## 2020-12-25 DIAGNOSIS — R918 Other nonspecific abnormal finding of lung field: Secondary | ICD-10-CM | POA: Diagnosis not present

## 2020-12-25 DIAGNOSIS — J9811 Atelectasis: Secondary | ICD-10-CM | POA: Diagnosis not present

## 2020-12-25 DIAGNOSIS — I361 Nonrheumatic tricuspid (valve) insufficiency: Secondary | ICD-10-CM | POA: Diagnosis not present

## 2020-12-25 DIAGNOSIS — K3189 Other diseases of stomach and duodenum: Secondary | ICD-10-CM | POA: Diagnosis not present

## 2020-12-25 DIAGNOSIS — D62 Acute posthemorrhagic anemia: Secondary | ICD-10-CM | POA: Diagnosis not present

## 2020-12-25 DIAGNOSIS — R000 Tachycardia, unspecified: Secondary | ICD-10-CM | POA: Diagnosis not present

## 2020-12-25 DIAGNOSIS — J189 Pneumonia, unspecified organism: Secondary | ICD-10-CM | POA: Diagnosis not present

## 2020-12-25 DIAGNOSIS — J439 Emphysema, unspecified: Secondary | ICD-10-CM | POA: Diagnosis not present

## 2020-12-25 DIAGNOSIS — J9600 Acute respiratory failure, unspecified whether with hypoxia or hypercapnia: Secondary | ICD-10-CM | POA: Diagnosis not present

## 2020-12-25 DIAGNOSIS — E871 Hypo-osmolality and hyponatremia: Secondary | ICD-10-CM | POA: Diagnosis not present

## 2020-12-25 DIAGNOSIS — R0689 Other abnormalities of breathing: Secondary | ICD-10-CM | POA: Diagnosis not present

## 2020-12-25 DIAGNOSIS — J811 Chronic pulmonary edema: Secondary | ICD-10-CM | POA: Diagnosis not present

## 2020-12-25 DIAGNOSIS — K6389 Other specified diseases of intestine: Secondary | ICD-10-CM | POA: Diagnosis not present

## 2020-12-25 DIAGNOSIS — N1330 Unspecified hydronephrosis: Secondary | ICD-10-CM | POA: Diagnosis not present

## 2020-12-25 DIAGNOSIS — I517 Cardiomegaly: Secondary | ICD-10-CM | POA: Diagnosis not present

## 2020-12-25 DIAGNOSIS — J90 Pleural effusion, not elsewhere classified: Secondary | ICD-10-CM | POA: Diagnosis not present

## 2020-12-25 DIAGNOSIS — K8689 Other specified diseases of pancreas: Secondary | ICD-10-CM | POA: Diagnosis not present

## 2020-12-25 DIAGNOSIS — R0600 Dyspnea, unspecified: Secondary | ICD-10-CM | POA: Diagnosis not present

## 2020-12-25 DIAGNOSIS — I712 Thoracic aortic aneurysm, without rupture: Secondary | ICD-10-CM | POA: Diagnosis not present

## 2020-12-25 DIAGNOSIS — N200 Calculus of kidney: Secondary | ICD-10-CM | POA: Diagnosis not present

## 2020-12-25 DIAGNOSIS — S301XXA Contusion of abdominal wall, initial encounter: Secondary | ICD-10-CM | POA: Diagnosis not present

## 2020-12-26 DIAGNOSIS — J441 Chronic obstructive pulmonary disease with (acute) exacerbation: Secondary | ICD-10-CM | POA: Diagnosis not present

## 2020-12-26 DIAGNOSIS — J189 Pneumonia, unspecified organism: Secondary | ICD-10-CM | POA: Diagnosis not present

## 2020-12-26 DIAGNOSIS — J9600 Acute respiratory failure, unspecified whether with hypoxia or hypercapnia: Secondary | ICD-10-CM | POA: Diagnosis not present

## 2020-12-27 DIAGNOSIS — J441 Chronic obstructive pulmonary disease with (acute) exacerbation: Secondary | ICD-10-CM | POA: Diagnosis not present

## 2020-12-27 DIAGNOSIS — J189 Pneumonia, unspecified organism: Secondary | ICD-10-CM | POA: Diagnosis not present

## 2020-12-27 DIAGNOSIS — J9600 Acute respiratory failure, unspecified whether with hypoxia or hypercapnia: Secondary | ICD-10-CM | POA: Diagnosis not present

## 2020-12-28 DIAGNOSIS — J441 Chronic obstructive pulmonary disease with (acute) exacerbation: Secondary | ICD-10-CM | POA: Diagnosis not present

## 2020-12-28 DIAGNOSIS — J9600 Acute respiratory failure, unspecified whether with hypoxia or hypercapnia: Secondary | ICD-10-CM | POA: Diagnosis not present

## 2020-12-28 DIAGNOSIS — J189 Pneumonia, unspecified organism: Secondary | ICD-10-CM | POA: Diagnosis not present

## 2020-12-29 DIAGNOSIS — J189 Pneumonia, unspecified organism: Secondary | ICD-10-CM | POA: Diagnosis not present

## 2020-12-29 DIAGNOSIS — J441 Chronic obstructive pulmonary disease with (acute) exacerbation: Secondary | ICD-10-CM | POA: Diagnosis not present

## 2020-12-29 DIAGNOSIS — J9600 Acute respiratory failure, unspecified whether with hypoxia or hypercapnia: Secondary | ICD-10-CM | POA: Diagnosis not present

## 2020-12-30 ENCOUNTER — Ambulatory Visit: Payer: Medicare HMO | Admitting: Vascular Surgery

## 2020-12-30 ENCOUNTER — Ambulatory Visit (HOSPITAL_COMMUNITY): Payer: Medicare HMO

## 2020-12-30 DIAGNOSIS — J189 Pneumonia, unspecified organism: Secondary | ICD-10-CM | POA: Diagnosis not present

## 2020-12-30 DIAGNOSIS — K8689 Other specified diseases of pancreas: Secondary | ICD-10-CM | POA: Diagnosis not present

## 2020-12-30 DIAGNOSIS — S301XXA Contusion of abdominal wall, initial encounter: Secondary | ICD-10-CM | POA: Diagnosis not present

## 2020-12-30 DIAGNOSIS — J9600 Acute respiratory failure, unspecified whether with hypoxia or hypercapnia: Secondary | ICD-10-CM | POA: Diagnosis not present

## 2020-12-30 DIAGNOSIS — K3189 Other diseases of stomach and duodenum: Secondary | ICD-10-CM | POA: Diagnosis not present

## 2020-12-30 DIAGNOSIS — J441 Chronic obstructive pulmonary disease with (acute) exacerbation: Secondary | ICD-10-CM | POA: Diagnosis not present

## 2020-12-30 DIAGNOSIS — K6389 Other specified diseases of intestine: Secondary | ICD-10-CM | POA: Diagnosis not present

## 2020-12-31 DIAGNOSIS — J90 Pleural effusion, not elsewhere classified: Secondary | ICD-10-CM | POA: Diagnosis not present

## 2020-12-31 DIAGNOSIS — J189 Pneumonia, unspecified organism: Secondary | ICD-10-CM | POA: Diagnosis not present

## 2020-12-31 DIAGNOSIS — R0602 Shortness of breath: Secondary | ICD-10-CM | POA: Diagnosis not present

## 2020-12-31 DIAGNOSIS — J441 Chronic obstructive pulmonary disease with (acute) exacerbation: Secondary | ICD-10-CM | POA: Diagnosis not present

## 2020-12-31 DIAGNOSIS — J9600 Acute respiratory failure, unspecified whether with hypoxia or hypercapnia: Secondary | ICD-10-CM | POA: Diagnosis not present

## 2021-01-01 DIAGNOSIS — J9600 Acute respiratory failure, unspecified whether with hypoxia or hypercapnia: Secondary | ICD-10-CM | POA: Diagnosis not present

## 2021-01-01 DIAGNOSIS — J189 Pneumonia, unspecified organism: Secondary | ICD-10-CM | POA: Diagnosis not present

## 2021-01-01 DIAGNOSIS — J441 Chronic obstructive pulmonary disease with (acute) exacerbation: Secondary | ICD-10-CM | POA: Diagnosis not present

## 2021-01-02 DIAGNOSIS — J441 Chronic obstructive pulmonary disease with (acute) exacerbation: Secondary | ICD-10-CM | POA: Diagnosis not present

## 2021-01-02 DIAGNOSIS — J189 Pneumonia, unspecified organism: Secondary | ICD-10-CM | POA: Diagnosis not present

## 2021-01-02 DIAGNOSIS — J9600 Acute respiratory failure, unspecified whether with hypoxia or hypercapnia: Secondary | ICD-10-CM | POA: Diagnosis not present

## 2021-01-03 DIAGNOSIS — J441 Chronic obstructive pulmonary disease with (acute) exacerbation: Secondary | ICD-10-CM | POA: Diagnosis not present

## 2021-01-03 DIAGNOSIS — J9600 Acute respiratory failure, unspecified whether with hypoxia or hypercapnia: Secondary | ICD-10-CM | POA: Diagnosis not present

## 2021-01-03 DIAGNOSIS — J189 Pneumonia, unspecified organism: Secondary | ICD-10-CM | POA: Diagnosis not present

## 2021-01-04 DIAGNOSIS — J9600 Acute respiratory failure, unspecified whether with hypoxia or hypercapnia: Secondary | ICD-10-CM | POA: Diagnosis not present

## 2021-01-04 DIAGNOSIS — J811 Chronic pulmonary edema: Secondary | ICD-10-CM | POA: Diagnosis not present

## 2021-01-04 DIAGNOSIS — J189 Pneumonia, unspecified organism: Secondary | ICD-10-CM | POA: Diagnosis not present

## 2021-01-04 DIAGNOSIS — I517 Cardiomegaly: Secondary | ICD-10-CM | POA: Diagnosis not present

## 2021-01-04 DIAGNOSIS — J90 Pleural effusion, not elsewhere classified: Secondary | ICD-10-CM | POA: Diagnosis not present

## 2021-01-04 DIAGNOSIS — J441 Chronic obstructive pulmonary disease with (acute) exacerbation: Secondary | ICD-10-CM | POA: Diagnosis not present

## 2021-01-04 DIAGNOSIS — J449 Chronic obstructive pulmonary disease, unspecified: Secondary | ICD-10-CM | POA: Diagnosis not present

## 2021-01-05 DIAGNOSIS — J9600 Acute respiratory failure, unspecified whether with hypoxia or hypercapnia: Secondary | ICD-10-CM | POA: Diagnosis not present

## 2021-01-05 DIAGNOSIS — J439 Emphysema, unspecified: Secondary | ICD-10-CM | POA: Diagnosis not present

## 2021-01-05 DIAGNOSIS — J441 Chronic obstructive pulmonary disease with (acute) exacerbation: Secondary | ICD-10-CM | POA: Diagnosis not present

## 2021-01-05 DIAGNOSIS — J90 Pleural effusion, not elsewhere classified: Secondary | ICD-10-CM | POA: Diagnosis not present

## 2021-01-05 DIAGNOSIS — J189 Pneumonia, unspecified organism: Secondary | ICD-10-CM | POA: Diagnosis not present

## 2021-01-06 DIAGNOSIS — J441 Chronic obstructive pulmonary disease with (acute) exacerbation: Secondary | ICD-10-CM | POA: Diagnosis not present

## 2021-01-06 DIAGNOSIS — N1330 Unspecified hydronephrosis: Secondary | ICD-10-CM | POA: Diagnosis not present

## 2021-01-06 DIAGNOSIS — J9600 Acute respiratory failure, unspecified whether with hypoxia or hypercapnia: Secondary | ICD-10-CM | POA: Diagnosis not present

## 2021-01-06 DIAGNOSIS — J189 Pneumonia, unspecified organism: Secondary | ICD-10-CM | POA: Diagnosis not present

## 2021-01-07 DIAGNOSIS — J441 Chronic obstructive pulmonary disease with (acute) exacerbation: Secondary | ICD-10-CM | POA: Diagnosis not present

## 2021-01-07 DIAGNOSIS — J9600 Acute respiratory failure, unspecified whether with hypoxia or hypercapnia: Secondary | ICD-10-CM | POA: Diagnosis not present

## 2021-01-07 DIAGNOSIS — J189 Pneumonia, unspecified organism: Secondary | ICD-10-CM | POA: Diagnosis not present

## 2021-01-08 DIAGNOSIS — J441 Chronic obstructive pulmonary disease with (acute) exacerbation: Secondary | ICD-10-CM | POA: Diagnosis not present

## 2021-01-08 DIAGNOSIS — J9600 Acute respiratory failure, unspecified whether with hypoxia or hypercapnia: Secondary | ICD-10-CM | POA: Diagnosis not present

## 2021-01-08 DIAGNOSIS — J189 Pneumonia, unspecified organism: Secondary | ICD-10-CM | POA: Diagnosis not present

## 2021-01-09 DIAGNOSIS — J441 Chronic obstructive pulmonary disease with (acute) exacerbation: Secondary | ICD-10-CM | POA: Diagnosis not present

## 2021-01-09 DIAGNOSIS — J449 Chronic obstructive pulmonary disease, unspecified: Secondary | ICD-10-CM | POA: Diagnosis not present

## 2021-01-09 DIAGNOSIS — J189 Pneumonia, unspecified organism: Secondary | ICD-10-CM | POA: Diagnosis not present

## 2021-01-09 DIAGNOSIS — J9600 Acute respiratory failure, unspecified whether with hypoxia or hypercapnia: Secondary | ICD-10-CM | POA: Diagnosis not present

## 2021-01-10 DIAGNOSIS — J9600 Acute respiratory failure, unspecified whether with hypoxia or hypercapnia: Secondary | ICD-10-CM | POA: Diagnosis not present

## 2021-01-10 DIAGNOSIS — I4891 Unspecified atrial fibrillation: Secondary | ICD-10-CM | POA: Diagnosis not present

## 2021-01-10 DIAGNOSIS — J441 Chronic obstructive pulmonary disease with (acute) exacerbation: Secondary | ICD-10-CM | POA: Diagnosis not present

## 2021-01-10 DIAGNOSIS — J189 Pneumonia, unspecified organism: Secondary | ICD-10-CM | POA: Diagnosis not present

## 2021-01-11 DIAGNOSIS — J9600 Acute respiratory failure, unspecified whether with hypoxia or hypercapnia: Secondary | ICD-10-CM | POA: Diagnosis not present

## 2021-01-11 DIAGNOSIS — J189 Pneumonia, unspecified organism: Secondary | ICD-10-CM | POA: Diagnosis not present

## 2021-01-11 DIAGNOSIS — J441 Chronic obstructive pulmonary disease with (acute) exacerbation: Secondary | ICD-10-CM | POA: Diagnosis not present

## 2021-01-12 DIAGNOSIS — J441 Chronic obstructive pulmonary disease with (acute) exacerbation: Secondary | ICD-10-CM | POA: Diagnosis not present

## 2021-01-12 DIAGNOSIS — I4891 Unspecified atrial fibrillation: Secondary | ICD-10-CM | POA: Diagnosis not present

## 2021-01-12 DIAGNOSIS — R7989 Other specified abnormal findings of blood chemistry: Secondary | ICD-10-CM | POA: Diagnosis not present

## 2021-01-12 DIAGNOSIS — R339 Retention of urine, unspecified: Secondary | ICD-10-CM | POA: Diagnosis not present

## 2021-01-12 DIAGNOSIS — K219 Gastro-esophageal reflux disease without esophagitis: Secondary | ICD-10-CM | POA: Diagnosis not present

## 2021-01-12 DIAGNOSIS — J9621 Acute and chronic respiratory failure with hypoxia: Secondary | ICD-10-CM | POA: Diagnosis not present

## 2021-01-12 DIAGNOSIS — R262 Difficulty in walking, not elsewhere classified: Secondary | ICD-10-CM | POA: Diagnosis not present

## 2021-01-12 DIAGNOSIS — J189 Pneumonia, unspecified organism: Secondary | ICD-10-CM | POA: Diagnosis not present

## 2021-01-12 DIAGNOSIS — D649 Anemia, unspecified: Secondary | ICD-10-CM | POA: Diagnosis not present

## 2021-01-12 DIAGNOSIS — R5381 Other malaise: Secondary | ICD-10-CM | POA: Diagnosis not present

## 2021-01-12 DIAGNOSIS — I10 Essential (primary) hypertension: Secondary | ICD-10-CM | POA: Diagnosis not present

## 2021-01-12 DIAGNOSIS — R69 Illness, unspecified: Secondary | ICD-10-CM | POA: Diagnosis not present

## 2021-01-12 DIAGNOSIS — M810 Age-related osteoporosis without current pathological fracture: Secondary | ICD-10-CM | POA: Diagnosis not present

## 2021-01-12 DIAGNOSIS — J9600 Acute respiratory failure, unspecified whether with hypoxia or hypercapnia: Secondary | ICD-10-CM | POA: Diagnosis not present

## 2021-01-12 DIAGNOSIS — I509 Heart failure, unspecified: Secondary | ICD-10-CM | POA: Diagnosis not present

## 2021-01-12 DIAGNOSIS — J449 Chronic obstructive pulmonary disease, unspecified: Secondary | ICD-10-CM | POA: Diagnosis not present

## 2021-01-12 DIAGNOSIS — Z720 Tobacco use: Secondary | ICD-10-CM | POA: Diagnosis not present

## 2021-01-15 DIAGNOSIS — J441 Chronic obstructive pulmonary disease with (acute) exacerbation: Secondary | ICD-10-CM | POA: Diagnosis not present

## 2021-01-15 DIAGNOSIS — J9621 Acute and chronic respiratory failure with hypoxia: Secondary | ICD-10-CM | POA: Diagnosis not present

## 2021-01-15 DIAGNOSIS — I4891 Unspecified atrial fibrillation: Secondary | ICD-10-CM | POA: Diagnosis not present

## 2021-01-15 DIAGNOSIS — R262 Difficulty in walking, not elsewhere classified: Secondary | ICD-10-CM | POA: Diagnosis not present

## 2021-01-27 DIAGNOSIS — I480 Paroxysmal atrial fibrillation: Secondary | ICD-10-CM | POA: Diagnosis not present

## 2021-01-27 DIAGNOSIS — K219 Gastro-esophageal reflux disease without esophagitis: Secondary | ICD-10-CM | POA: Diagnosis not present

## 2021-01-27 DIAGNOSIS — N1330 Unspecified hydronephrosis: Secondary | ICD-10-CM | POA: Diagnosis not present

## 2021-01-27 DIAGNOSIS — D649 Anemia, unspecified: Secondary | ICD-10-CM | POA: Diagnosis not present

## 2021-01-27 DIAGNOSIS — R69 Illness, unspecified: Secondary | ICD-10-CM | POA: Diagnosis not present

## 2021-01-27 DIAGNOSIS — I10 Essential (primary) hypertension: Secondary | ICD-10-CM | POA: Diagnosis not present

## 2021-01-27 DIAGNOSIS — B379 Candidiasis, unspecified: Secondary | ICD-10-CM | POA: Diagnosis not present

## 2021-01-27 DIAGNOSIS — M810 Age-related osteoporosis without current pathological fracture: Secondary | ICD-10-CM | POA: Diagnosis not present

## 2021-01-27 DIAGNOSIS — J9621 Acute and chronic respiratory failure with hypoxia: Secondary | ICD-10-CM | POA: Diagnosis not present

## 2021-01-27 DIAGNOSIS — J441 Chronic obstructive pulmonary disease with (acute) exacerbation: Secondary | ICD-10-CM | POA: Diagnosis not present

## 2021-02-02 DIAGNOSIS — N952 Postmenopausal atrophic vaginitis: Secondary | ICD-10-CM | POA: Diagnosis not present

## 2021-02-02 DIAGNOSIS — R339 Retention of urine, unspecified: Secondary | ICD-10-CM | POA: Diagnosis not present

## 2021-02-02 DIAGNOSIS — Z79899 Other long term (current) drug therapy: Secondary | ICD-10-CM | POA: Diagnosis not present

## 2021-02-02 DIAGNOSIS — N3946 Mixed incontinence: Secondary | ICD-10-CM | POA: Diagnosis not present

## 2021-02-03 ENCOUNTER — Ambulatory Visit: Payer: Medicare HMO | Admitting: Vascular Surgery

## 2021-02-03 ENCOUNTER — Ambulatory Visit (HOSPITAL_COMMUNITY): Payer: Medicare HMO

## 2021-02-03 DIAGNOSIS — R339 Retention of urine, unspecified: Secondary | ICD-10-CM | POA: Diagnosis not present

## 2021-02-03 DIAGNOSIS — E871 Hypo-osmolality and hyponatremia: Secondary | ICD-10-CM | POA: Diagnosis not present

## 2021-02-03 DIAGNOSIS — Z87891 Personal history of nicotine dependence: Secondary | ICD-10-CM | POA: Diagnosis not present

## 2021-02-03 DIAGNOSIS — R1904 Left lower quadrant abdominal swelling, mass and lump: Secondary | ICD-10-CM | POA: Diagnosis not present

## 2021-02-03 DIAGNOSIS — I4891 Unspecified atrial fibrillation: Secondary | ICD-10-CM | POA: Diagnosis not present

## 2021-02-03 DIAGNOSIS — J9601 Acute respiratory failure with hypoxia: Secondary | ICD-10-CM | POA: Diagnosis not present

## 2021-02-03 DIAGNOSIS — J441 Chronic obstructive pulmonary disease with (acute) exacerbation: Secondary | ICD-10-CM | POA: Diagnosis not present

## 2021-02-03 DIAGNOSIS — D62 Acute posthemorrhagic anemia: Secondary | ICD-10-CM | POA: Diagnosis not present

## 2021-02-04 DIAGNOSIS — N952 Postmenopausal atrophic vaginitis: Secondary | ICD-10-CM | POA: Diagnosis not present

## 2021-02-04 DIAGNOSIS — R339 Retention of urine, unspecified: Secondary | ICD-10-CM | POA: Diagnosis not present

## 2021-02-04 DIAGNOSIS — B373 Candidiasis of vulva and vagina: Secondary | ICD-10-CM | POA: Diagnosis not present

## 2021-02-04 DIAGNOSIS — N3946 Mixed incontinence: Secondary | ICD-10-CM | POA: Diagnosis not present

## 2021-02-04 DIAGNOSIS — N390 Urinary tract infection, site not specified: Secondary | ICD-10-CM | POA: Diagnosis not present

## 2021-02-09 DIAGNOSIS — J449 Chronic obstructive pulmonary disease, unspecified: Secondary | ICD-10-CM | POA: Diagnosis not present

## 2021-02-21 DIAGNOSIS — J449 Chronic obstructive pulmonary disease, unspecified: Secondary | ICD-10-CM | POA: Diagnosis not present

## 2021-03-03 DIAGNOSIS — I480 Paroxysmal atrial fibrillation: Secondary | ICD-10-CM | POA: Diagnosis not present

## 2021-03-03 DIAGNOSIS — R69 Illness, unspecified: Secondary | ICD-10-CM | POA: Diagnosis not present

## 2021-03-03 DIAGNOSIS — J690 Pneumonitis due to inhalation of food and vomit: Secondary | ICD-10-CM | POA: Diagnosis not present

## 2021-03-03 DIAGNOSIS — Z6822 Body mass index (BMI) 22.0-22.9, adult: Secondary | ICD-10-CM | POA: Diagnosis not present

## 2021-03-03 DIAGNOSIS — J441 Chronic obstructive pulmonary disease with (acute) exacerbation: Secondary | ICD-10-CM | POA: Diagnosis not present

## 2021-03-03 DIAGNOSIS — D649 Anemia, unspecified: Secondary | ICD-10-CM | POA: Diagnosis not present

## 2021-03-03 DIAGNOSIS — Z9181 History of falling: Secondary | ICD-10-CM | POA: Diagnosis not present

## 2021-03-11 DIAGNOSIS — J449 Chronic obstructive pulmonary disease, unspecified: Secondary | ICD-10-CM | POA: Diagnosis not present

## 2021-03-16 DIAGNOSIS — E876 Hypokalemia: Secondary | ICD-10-CM | POA: Diagnosis not present

## 2021-03-16 DIAGNOSIS — I480 Paroxysmal atrial fibrillation: Secondary | ICD-10-CM | POA: Diagnosis not present

## 2021-03-16 DIAGNOSIS — Z6822 Body mass index (BMI) 22.0-22.9, adult: Secondary | ICD-10-CM | POA: Diagnosis not present

## 2021-03-16 DIAGNOSIS — N1831 Chronic kidney disease, stage 3a: Secondary | ICD-10-CM | POA: Diagnosis not present

## 2021-03-16 DIAGNOSIS — J690 Pneumonitis due to inhalation of food and vomit: Secondary | ICD-10-CM | POA: Diagnosis not present

## 2021-03-16 DIAGNOSIS — J441 Chronic obstructive pulmonary disease with (acute) exacerbation: Secondary | ICD-10-CM | POA: Diagnosis not present

## 2021-03-19 DIAGNOSIS — R339 Retention of urine, unspecified: Secondary | ICD-10-CM | POA: Diagnosis not present

## 2021-03-19 DIAGNOSIS — N952 Postmenopausal atrophic vaginitis: Secondary | ICD-10-CM | POA: Diagnosis not present

## 2021-03-19 DIAGNOSIS — N3946 Mixed incontinence: Secondary | ICD-10-CM | POA: Diagnosis not present

## 2021-03-24 DIAGNOSIS — J690 Pneumonitis due to inhalation of food and vomit: Secondary | ICD-10-CM | POA: Diagnosis not present

## 2021-03-24 DIAGNOSIS — J441 Chronic obstructive pulmonary disease with (acute) exacerbation: Secondary | ICD-10-CM | POA: Diagnosis not present

## 2021-03-24 DIAGNOSIS — J449 Chronic obstructive pulmonary disease, unspecified: Secondary | ICD-10-CM | POA: Diagnosis not present

## 2021-03-24 DIAGNOSIS — R69 Illness, unspecified: Secondary | ICD-10-CM | POA: Diagnosis not present

## 2021-03-24 DIAGNOSIS — Z6821 Body mass index (BMI) 21.0-21.9, adult: Secondary | ICD-10-CM | POA: Diagnosis not present

## 2021-03-29 DIAGNOSIS — J690 Pneumonitis due to inhalation of food and vomit: Secondary | ICD-10-CM | POA: Diagnosis not present

## 2021-03-29 DIAGNOSIS — R1310 Dysphagia, unspecified: Secondary | ICD-10-CM | POA: Diagnosis not present

## 2021-03-31 ENCOUNTER — Ambulatory Visit: Payer: Medicare HMO | Admitting: Allergy and Immunology

## 2021-04-03 ENCOUNTER — Other Ambulatory Visit: Payer: Self-pay | Admitting: Allergy and Immunology

## 2021-04-06 DIAGNOSIS — J441 Chronic obstructive pulmonary disease with (acute) exacerbation: Secondary | ICD-10-CM | POA: Diagnosis not present

## 2021-04-08 DIAGNOSIS — J690 Pneumonitis due to inhalation of food and vomit: Secondary | ICD-10-CM | POA: Diagnosis not present

## 2021-04-08 DIAGNOSIS — Z6821 Body mass index (BMI) 21.0-21.9, adult: Secondary | ICD-10-CM | POA: Diagnosis not present

## 2021-04-08 DIAGNOSIS — J441 Chronic obstructive pulmonary disease with (acute) exacerbation: Secondary | ICD-10-CM | POA: Diagnosis not present

## 2021-04-08 DIAGNOSIS — Z13820 Encounter for screening for osteoporosis: Secondary | ICD-10-CM | POA: Diagnosis not present

## 2021-04-11 DIAGNOSIS — J449 Chronic obstructive pulmonary disease, unspecified: Secondary | ICD-10-CM | POA: Diagnosis not present

## 2021-04-14 ENCOUNTER — Ambulatory Visit: Payer: Medicare HMO | Admitting: Allergy and Immunology

## 2021-04-14 ENCOUNTER — Encounter: Payer: Self-pay | Admitting: Allergy and Immunology

## 2021-04-14 ENCOUNTER — Other Ambulatory Visit: Payer: Self-pay

## 2021-04-14 VITALS — BP 124/74 | HR 93 | Temp 98.0°F | Resp 20 | Ht 60.0 in | Wt 119.8 lb

## 2021-04-14 DIAGNOSIS — F17210 Nicotine dependence, cigarettes, uncomplicated: Secondary | ICD-10-CM | POA: Diagnosis not present

## 2021-04-14 DIAGNOSIS — G4736 Sleep related hypoventilation in conditions classified elsewhere: Secondary | ICD-10-CM | POA: Diagnosis not present

## 2021-04-14 DIAGNOSIS — J449 Chronic obstructive pulmonary disease, unspecified: Secondary | ICD-10-CM

## 2021-04-14 DIAGNOSIS — R69 Illness, unspecified: Secondary | ICD-10-CM | POA: Diagnosis not present

## 2021-04-14 DIAGNOSIS — R0902 Hypoxemia: Secondary | ICD-10-CM | POA: Diagnosis not present

## 2021-04-14 DIAGNOSIS — K219 Gastro-esophageal reflux disease without esophagitis: Secondary | ICD-10-CM

## 2021-04-14 MED ORDER — TRELEGY ELLIPTA 200-62.5-25 MCG/INH IN AEPB: INHALATION_SPRAY | RESPIRATORY_TRACT | 5 refills | Status: DC

## 2021-04-14 NOTE — Progress Notes (Signed)
[place] [facility] [place] [facility] [place]   Follow-up Note  Referring Provider: Nicoletta Dress, MD Primary Provider: Serita Grammes, MD Date of Office Visit: 04/14/2021  Subjective:   [name] (DOB: 1941-02-09) is a 80 y.o. female who returns to the [facility] on 04/14/2021 in re-evaluation of the following:  HPI: [name] returns to this clinic in evaluation of severe COPD with a component of asthma, active tobacco smoking, history of nocturnal and exercise-induced hypoxemia, and a history of reflux.  I last saw her in this clinic on 01 October 2020.  She informs me that in March 2022 she required an 18-day hospitalization for what sounds like respiratory tract failure possibly secondary to a "pneumonia".  At this point in time she feels as though she has resolved all the symptoms involved with that event and is back to her baseline which means that she has some active coughing, but she can exert herself to the extent that she so desires, and she uses oxygen "when she needs it".  She still continues on a triple inhaler and montelukast.  Apparently she may still have "a little bit of pneumonia" left in her lung based on an x-ray and she has been treated with recurrent antibiotics in an attempt to clear this issue.  Difficult to say if she has had a CT scan of her chest in investigation of this "little bit of pneumonia" that still remains.  She continues to smoke currently at 1 pack/day.  She believes that her reflux is under very good control at this point in time.  Allergies as of 04/14/2021       Reactions   Codeine Shortness Of Breath   Pravastatin Other (See Comments)   Give muscle aches and weakness    Contrast Media [iodinated Diagnostic Agents] Rash   Itching and rash   Metrizamide Rash   Itching and rash        Medication List     acetaminophen 325 MG tablet Commonly known as: TYLENOL Take 650 mg by mouth every 6 (six)  hours as needed.   AeroChamber Plus inhaler Use as directed with inhaler.   albuterol (2.5 MG/3ML) 0.083% nebulizer solution Commonly known as: PROVENTIL Take 2.5 mg by nebulization every 6 (six) hours as needed for wheezing or shortness of breath.   albuterol 108 (90 Base) MCG/ACT inhaler Commonly known as: VENTOLIN HFA 2 inhalations every 4-6 hrs   alendronate 70 MG tablet Commonly known as: FOSAMAX Take 70 mg by mouth once a week.   aspirin 81 MG tablet Take 81 mg by mouth 2 (two) times daily.   diltiazem 120 MG tablet Commonly known as: CARDIZEM Take 120 mg by mouth daily.   doxycycline 100 MG capsule Commonly known as: VIBRAMYCIN Take 100 mg by mouth 2 (two) times daily.   furosemide 40 MG tablet Commonly known as: LASIX Take 40 mg by mouth daily.   Garlic 0175 MG Caps Take by mouth.   ibuprofen 600 MG tablet Commonly known as: ADVIL Take 600 mg by mouth every 8 (eight) hours as needed.   ipratropium-albuterol 0.5-2.5 (3) MG/3ML Soln Commonly known as: DUONEB Take by nebulization.   LORazepam 0.5 MG tablet Commonly known as: ATIVAN Take 0.5 mg by mouth 3 (three) times daily.   Magnesium 400 MG Tabs Take by mouth in the morning and at bedtime.   montelukast 10 MG tablet Commonly known as: SINGULAIR TAKE 1 TABLET BY MOUTH AT BEDTIME  nystatin cream Commonly known as: MYCOSTATIN SMARTSIG:Vaginal 3-4 Times Daily   pantoprazole 40 MG tablet Commonly known as: PROTONIX Take 1 tablet by mouth once daily   predniSONE 5 MG (48) Tbpk tablet Commonly known as: STERAPRED UNI-PAK 48 TAB Take 5 mg by mouth as directed.   tamsulosin 0.4 MG Caps capsule Commonly known as: FLOMAX Take 0.4 mg by mouth 2 (two) times daily.   Trelegy Ellipta 200-62.5-25 MCG/INH Aepb Generic drug: Fluticasone-Umeclidin-Vilant Inhale one dose once daily to prevent cough or wheeze.  Rinse, gargle, and spit after use. What changed: See the new instructions. Changed by: Jiles Prows, MD        Past Medical History:  Diagnosis Date   Allergy    Anxiety    Arthritis    Asthma    Bronchitis    CAD (coronary artery disease)    Cancer ([place])    skin cancer right arm   Carotid artery occlusion    Cataracts, bilateral    CHF (congestive heart failure) (HCC)    Chronic airway obstruction, not elsewhere classified    COPD (chronic obstructive pulmonary disease) ([place])    Dysphagia, unspecified(787.20)    Generalized convulsive epilepsy without mention of intractable epilepsy    GERD (gastroesophageal reflux disease)    Headache    Hearing impaired    Hyperlipidemia    Hypersomnia with sleep apnea, unspecified    Osteoporosis    Oxygen desaturation during sleep    PRN;02 liters   Paroxysmal atrial tachycardia (HCC)    PONV (postoperative nausea and vomiting)    " I had problems where my heart starts speeding and fluttering; it goes away by itself ."   Pure hypercholesterolemia    Reflux    Rheumatic fever    as a child   Seizures ([place])    Shortness of breath 03/11/2010   while laying still   Wears glasses    Wears hearing aids     Past Surgical History:  Procedure Laterality Date   ABDOMINAL HYSTERECTOMY     ABLATION     x 3   CAROTID ENDARTERECTOMY  11/11/2010   left with Dacron patch angioplasty   CESAREAN SECTION     2 c-sections   ENDARTERECTOMY Right 07/19/2016   Procedure: ENDARTERECTOMY RIGHT CAROTID ARTERY;  Surgeon: Angelia Mould, MD;  Location: [facility];  Service: Vascular;  Laterality: Right;   HERNIA REPAIR     PATCH ANGIOPLASTY Right 07/19/2016   Procedure: PATCH ANGIOPLASTY RIGHT CAROTID ARTERY USING [place];  Surgeon: Angelia Mould, MD;  [facility];  Service: Vascular;  Laterality: Right;   SKIN CANCER EXCISION     TONSILLECTOMY AND ADENOIDECTOMY      Review of systems negative except as noted in HPI / PMHx or noted below:  Review of Systems  Constitutional: Negative.   HENT:  Negative.    Eyes: Negative.   Respiratory: Negative.    Cardiovascular: Negative.   Gastrointestinal: Negative.   Genitourinary: Negative.   Musculoskeletal: Negative.   Skin: Negative.   Neurological: Negative.   Endo/Heme/Allergies: Negative.   Psychiatric/Behavioral: Negative.      Objective:   Vitals:   04/14/21 1108  BP: 124/74  Pulse: 93  Resp: 20  Temp: 98 F (36.7 C)  SpO2: 95%   Height: 5' (152.4 cm)  Weight: 119 lb 12.8 oz (54.3 kg)   Physical Exam Constitutional:      Appearance: She is not diaphoretic.  Comments: Prolonged expiratory phase, coughing  HENT:     Head: Normocephalic.     Right Ear: Tympanic membrane, ear canal and external ear normal.     Left Ear: Tympanic membrane, ear canal and external ear normal.     Nose: Nose normal. No mucosal edema or rhinorrhea.     Mouth/Throat:     Pharynx: Uvula midline. No oropharyngeal exudate.  Eyes:     Conjunctiva/sclera: Conjunctivae normal.  Neck:     Thyroid: No thyromegaly.     Trachea: Trachea normal. No tracheal tenderness or tracheal deviation.  Cardiovascular:     Rate and Rhythm: Normal rate and regular rhythm.     Heart sounds: Normal heart sounds, S1 normal and S2 normal. No murmur heard. Pulmonary:     Effort: No respiratory distress.     Breath sounds: No stridor. Wheezing (Bilateral expiratory wheezes all lung fields) present. No rales.  Lymphadenopathy:     Head:     Right side of head: No tonsillar adenopathy.     Left side of head: No tonsillar adenopathy.     Cervical: No cervical adenopathy.  Skin:    Findings: No erythema or rash.     Nails: There is no clubbing.  Neurological:     Mental Status: She is alert.    Diagnostics: none  Assessment and Plan:   1. COPD with asthma ([place])   2. 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 Exercise hypoxemia   4. Tobacco smoker, 20 cigarettes or fewer per day   5. Gastroesophageal reflux disease,  unspecified whether esophagitis present     1.  Use nicotine substitutes to replace tobacco smoke exposure  2.  Continue to treat and prevent inflammation:   A. Trelegy 200 - 1 inhalation 1 time per day  B. Montelukast 10 mg - 1 tablet 1 time per day  3.  Continue to treat and prevent reflux:   A. Pantoprazole 40 mg - 1 tablet 1 time per day  4.  Use oxygen 2 L nasal cannula during exertion and at bedtime.    5. If needed:   A.  Albuterol HFA -2 inhalations every 4-6 hours  6.  Return to clinic in 6 months or earlier if problem  The best thing that Shakeerah could do to improve her health status is to stop smoking and I once again had a talk with her today about this issue as I have talked to her about this issue in the past.  She can continue on a triple inhaler at this point but certainly if she develops a another "pneumonia" and then we are going to need to remove her inhaled steroid components.  Her reflux appears to be under pretty good control at this point.  I will review all of the studies that were performed during her recent hospitalization.  If she truly has follow-up chest x-rays or CT scan that shows persistent disease in her lung then she obviously is going to require further evaluation and treatment.  Allena Katz, MD Allergy / Immunology [place]

## 2021-04-14 NOTE — Patient Instructions (Signed)
  1.  Use nicotine substitutes to replace tobacco smoke exposure  2.  Continue to treat and prevent inflammation:   A. Trelegy 200 - 1 inhalation 1 time per day  B. Montelukast 10 mg - 1 tablet 1 time per day  3.  Continue to treat and prevent reflux:   A. Pantoprazole 40 mg - 1 tablet 1 time per day  4.  Use oxygen 2 L nasal cannula during exertion and at bedtime.    5. If needed:   A.  Albuterol HFA -2 inhalations every 4-6 hours  6.  Return to clinic in 6 months or earlier if problem

## 2021-04-15 ENCOUNTER — Encounter: Payer: Self-pay | Admitting: Allergy and Immunology

## 2021-04-20 DIAGNOSIS — R339 Retention of urine, unspecified: Secondary | ICD-10-CM | POA: Diagnosis not present

## 2021-04-23 DIAGNOSIS — J449 Chronic obstructive pulmonary disease, unspecified: Secondary | ICD-10-CM | POA: Diagnosis not present

## 2021-04-27 DIAGNOSIS — K219 Gastro-esophageal reflux disease without esophagitis: Secondary | ICD-10-CM | POA: Diagnosis not present

## 2021-04-27 DIAGNOSIS — S99912A Unspecified injury of left ankle, initial encounter: Secondary | ICD-10-CM | POA: Diagnosis not present

## 2021-04-27 DIAGNOSIS — R0609 Other forms of dyspnea: Secondary | ICD-10-CM | POA: Diagnosis not present

## 2021-04-27 DIAGNOSIS — J441 Chronic obstructive pulmonary disease with (acute) exacerbation: Secondary | ICD-10-CM | POA: Diagnosis not present

## 2021-04-27 DIAGNOSIS — J309 Allergic rhinitis, unspecified: Secondary | ICD-10-CM | POA: Diagnosis not present

## 2021-04-27 DIAGNOSIS — D692 Other nonthrombocytopenic purpura: Secondary | ICD-10-CM | POA: Diagnosis not present

## 2021-04-27 DIAGNOSIS — R0982 Postnasal drip: Secondary | ICD-10-CM | POA: Diagnosis not present

## 2021-04-27 DIAGNOSIS — Z6822 Body mass index (BMI) 22.0-22.9, adult: Secondary | ICD-10-CM | POA: Diagnosis not present

## 2021-05-03 ENCOUNTER — Other Ambulatory Visit: Payer: Self-pay | Admitting: Allergy and Immunology

## 2021-05-04 DIAGNOSIS — R059 Cough, unspecified: Secondary | ICD-10-CM | POA: Diagnosis not present

## 2021-05-04 DIAGNOSIS — Z20828 Contact with and (suspected) exposure to other viral communicable diseases: Secondary | ICD-10-CM | POA: Diagnosis not present

## 2021-05-11 DIAGNOSIS — J449 Chronic obstructive pulmonary disease, unspecified: Secondary | ICD-10-CM | POA: Diagnosis not present

## 2021-05-17 ENCOUNTER — Encounter: Payer: Self-pay | Admitting: *Deleted

## 2021-05-17 DIAGNOSIS — J690 Pneumonitis due to inhalation of food and vomit: Secondary | ICD-10-CM | POA: Diagnosis not present

## 2021-05-17 DIAGNOSIS — J441 Chronic obstructive pulmonary disease with (acute) exacerbation: Secondary | ICD-10-CM | POA: Diagnosis not present

## 2021-05-17 DIAGNOSIS — Z6821 Body mass index (BMI) 21.0-21.9, adult: Secondary | ICD-10-CM | POA: Diagnosis not present

## 2021-05-20 DIAGNOSIS — L720 Epidermal cyst: Secondary | ICD-10-CM | POA: Diagnosis not present

## 2021-05-20 DIAGNOSIS — L820 Inflamed seborrheic keratosis: Secondary | ICD-10-CM | POA: Diagnosis not present

## 2021-05-20 DIAGNOSIS — L02414 Cutaneous abscess of left upper limb: Secondary | ICD-10-CM | POA: Diagnosis not present

## 2021-05-24 DIAGNOSIS — J449 Chronic obstructive pulmonary disease, unspecified: Secondary | ICD-10-CM | POA: Diagnosis not present

## 2021-05-25 DIAGNOSIS — R2232 Localized swelling, mass and lump, left upper limb: Secondary | ICD-10-CM | POA: Diagnosis not present

## 2021-05-26 DIAGNOSIS — J13 Pneumonia due to Streptococcus pneumoniae: Secondary | ICD-10-CM | POA: Diagnosis not present

## 2021-05-26 DIAGNOSIS — N6332 Unspecified lump in axillary tail of the left breast: Secondary | ICD-10-CM | POA: Diagnosis not present

## 2021-05-26 DIAGNOSIS — R69 Illness, unspecified: Secondary | ICD-10-CM | POA: Diagnosis not present

## 2021-05-26 DIAGNOSIS — R928 Other abnormal and inconclusive findings on diagnostic imaging of breast: Secondary | ICD-10-CM | POA: Diagnosis not present

## 2021-05-26 DIAGNOSIS — N6489 Other specified disorders of breast: Secondary | ICD-10-CM | POA: Diagnosis not present

## 2021-05-26 DIAGNOSIS — Z20828 Contact with and (suspected) exposure to other viral communicable diseases: Secondary | ICD-10-CM | POA: Diagnosis not present

## 2021-05-26 DIAGNOSIS — L02419 Cutaneous abscess of limb, unspecified: Secondary | ICD-10-CM | POA: Diagnosis not present

## 2021-05-26 DIAGNOSIS — R0609 Other forms of dyspnea: Secondary | ICD-10-CM | POA: Diagnosis not present

## 2021-05-26 DIAGNOSIS — Z6821 Body mass index (BMI) 21.0-21.9, adult: Secondary | ICD-10-CM | POA: Diagnosis not present

## 2021-05-26 DIAGNOSIS — R051 Acute cough: Secondary | ICD-10-CM | POA: Diagnosis not present

## 2021-05-26 DIAGNOSIS — J441 Chronic obstructive pulmonary disease with (acute) exacerbation: Secondary | ICD-10-CM | POA: Diagnosis not present

## 2021-05-27 DIAGNOSIS — L02412 Cutaneous abscess of left axilla: Secondary | ICD-10-CM | POA: Diagnosis not present

## 2021-05-27 DIAGNOSIS — R928 Other abnormal and inconclusive findings on diagnostic imaging of breast: Secondary | ICD-10-CM | POA: Diagnosis not present

## 2021-06-03 DIAGNOSIS — Z6821 Body mass index (BMI) 21.0-21.9, adult: Secondary | ICD-10-CM | POA: Diagnosis not present

## 2021-06-03 DIAGNOSIS — J13 Pneumonia due to Streptococcus pneumoniae: Secondary | ICD-10-CM | POA: Diagnosis not present

## 2021-06-03 DIAGNOSIS — R69 Illness, unspecified: Secondary | ICD-10-CM | POA: Diagnosis not present

## 2021-06-03 DIAGNOSIS — J441 Chronic obstructive pulmonary disease with (acute) exacerbation: Secondary | ICD-10-CM | POA: Diagnosis not present

## 2021-06-03 DIAGNOSIS — R1011 Right upper quadrant pain: Secondary | ICD-10-CM | POA: Diagnosis not present

## 2021-06-08 DIAGNOSIS — R1011 Right upper quadrant pain: Secondary | ICD-10-CM | POA: Diagnosis not present

## 2021-06-11 DIAGNOSIS — J449 Chronic obstructive pulmonary disease, unspecified: Secondary | ICD-10-CM | POA: Diagnosis not present

## 2021-06-15 DIAGNOSIS — L02419 Cutaneous abscess of limb, unspecified: Secondary | ICD-10-CM | POA: Diagnosis not present

## 2021-06-17 DIAGNOSIS — Z885 Allergy status to narcotic agent status: Secondary | ICD-10-CM | POA: Diagnosis not present

## 2021-06-17 DIAGNOSIS — Z7983 Long term (current) use of bisphosphonates: Secondary | ICD-10-CM | POA: Diagnosis not present

## 2021-06-17 DIAGNOSIS — Z743 Need for continuous supervision: Secondary | ICD-10-CM | POA: Diagnosis not present

## 2021-06-17 DIAGNOSIS — K219 Gastro-esophageal reflux disease without esophagitis: Secondary | ICD-10-CM | POA: Diagnosis not present

## 2021-06-17 DIAGNOSIS — Z888 Allergy status to other drugs, medicaments and biological substances status: Secondary | ICD-10-CM | POA: Diagnosis not present

## 2021-06-17 DIAGNOSIS — Z79899 Other long term (current) drug therapy: Secondary | ICD-10-CM | POA: Diagnosis not present

## 2021-06-17 DIAGNOSIS — R0602 Shortness of breath: Secondary | ICD-10-CM | POA: Diagnosis not present

## 2021-06-17 DIAGNOSIS — I6529 Occlusion and stenosis of unspecified carotid artery: Secondary | ICD-10-CM | POA: Diagnosis not present

## 2021-06-17 DIAGNOSIS — R69 Illness, unspecified: Secondary | ICD-10-CM | POA: Diagnosis not present

## 2021-06-17 DIAGNOSIS — Z85828 Personal history of other malignant neoplasm of skin: Secondary | ICD-10-CM | POA: Diagnosis not present

## 2021-06-17 DIAGNOSIS — I272 Pulmonary hypertension, unspecified: Secondary | ICD-10-CM | POA: Diagnosis not present

## 2021-06-17 DIAGNOSIS — J90 Pleural effusion, not elsewhere classified: Secondary | ICD-10-CM | POA: Diagnosis not present

## 2021-06-17 DIAGNOSIS — J9601 Acute respiratory failure with hypoxia: Secondary | ICD-10-CM | POA: Diagnosis not present

## 2021-06-17 DIAGNOSIS — I2109 ST elevation (STEMI) myocardial infarction involving other coronary artery of anterior wall: Secondary | ICD-10-CM | POA: Diagnosis not present

## 2021-06-17 DIAGNOSIS — Z91041 Radiographic dye allergy status: Secondary | ICD-10-CM | POA: Diagnosis not present

## 2021-06-17 DIAGNOSIS — I4891 Unspecified atrial fibrillation: Secondary | ICD-10-CM | POA: Diagnosis not present

## 2021-06-17 DIAGNOSIS — I509 Heart failure, unspecified: Secondary | ICD-10-CM | POA: Diagnosis not present

## 2021-06-17 DIAGNOSIS — I2510 Atherosclerotic heart disease of native coronary artery without angina pectoris: Secondary | ICD-10-CM | POA: Diagnosis not present

## 2021-06-17 DIAGNOSIS — Z882 Allergy status to sulfonamides status: Secondary | ICD-10-CM | POA: Diagnosis not present

## 2021-06-17 DIAGNOSIS — Z720 Tobacco use: Secondary | ICD-10-CM | POA: Diagnosis not present

## 2021-06-17 DIAGNOSIS — Z7982 Long term (current) use of aspirin: Secondary | ICD-10-CM | POA: Diagnosis not present

## 2021-06-17 DIAGNOSIS — J441 Chronic obstructive pulmonary disease with (acute) exacerbation: Secondary | ICD-10-CM | POA: Diagnosis not present

## 2021-06-21 DIAGNOSIS — I4891 Unspecified atrial fibrillation: Secondary | ICD-10-CM | POA: Diagnosis not present

## 2021-06-21 DIAGNOSIS — J90 Pleural effusion, not elsewhere classified: Secondary | ICD-10-CM | POA: Diagnosis not present

## 2021-06-21 DIAGNOSIS — J811 Chronic pulmonary edema: Secondary | ICD-10-CM | POA: Diagnosis not present

## 2021-06-21 DIAGNOSIS — R0902 Hypoxemia: Secondary | ICD-10-CM | POA: Diagnosis not present

## 2021-06-21 DIAGNOSIS — J441 Chronic obstructive pulmonary disease with (acute) exacerbation: Secondary | ICD-10-CM | POA: Diagnosis not present

## 2021-06-21 DIAGNOSIS — R0602 Shortness of breath: Secondary | ICD-10-CM | POA: Diagnosis not present

## 2021-06-21 DIAGNOSIS — J9601 Acute respiratory failure with hypoxia: Secondary | ICD-10-CM | POA: Diagnosis not present

## 2021-06-21 DIAGNOSIS — I509 Heart failure, unspecified: Secondary | ICD-10-CM | POA: Diagnosis not present

## 2021-06-21 DIAGNOSIS — R69 Illness, unspecified: Secondary | ICD-10-CM | POA: Diagnosis not present

## 2021-06-21 DIAGNOSIS — I6529 Occlusion and stenosis of unspecified carotid artery: Secondary | ICD-10-CM | POA: Diagnosis not present

## 2021-06-21 DIAGNOSIS — Z720 Tobacco use: Secondary | ICD-10-CM | POA: Diagnosis not present

## 2021-06-21 DIAGNOSIS — K219 Gastro-esophageal reflux disease without esophagitis: Secondary | ICD-10-CM | POA: Diagnosis not present

## 2021-06-23 DIAGNOSIS — M810 Age-related osteoporosis without current pathological fracture: Secondary | ICD-10-CM | POA: Diagnosis not present

## 2021-06-23 DIAGNOSIS — I509 Heart failure, unspecified: Secondary | ICD-10-CM | POA: Diagnosis not present

## 2021-06-23 DIAGNOSIS — I110 Hypertensive heart disease with heart failure: Secondary | ICD-10-CM | POA: Diagnosis not present

## 2021-06-23 DIAGNOSIS — J9611 Chronic respiratory failure with hypoxia: Secondary | ICD-10-CM | POA: Diagnosis not present

## 2021-06-23 DIAGNOSIS — I480 Paroxysmal atrial fibrillation: Secondary | ICD-10-CM | POA: Diagnosis not present

## 2021-06-23 DIAGNOSIS — J441 Chronic obstructive pulmonary disease with (acute) exacerbation: Secondary | ICD-10-CM | POA: Diagnosis not present

## 2021-06-23 DIAGNOSIS — D649 Anemia, unspecified: Secondary | ICD-10-CM | POA: Diagnosis not present

## 2021-06-23 DIAGNOSIS — K219 Gastro-esophageal reflux disease without esophagitis: Secondary | ICD-10-CM | POA: Diagnosis not present

## 2021-06-23 DIAGNOSIS — K5900 Constipation, unspecified: Secondary | ICD-10-CM | POA: Diagnosis not present

## 2021-06-23 DIAGNOSIS — R69 Illness, unspecified: Secondary | ICD-10-CM | POA: Diagnosis not present

## 2021-06-24 DIAGNOSIS — J449 Chronic obstructive pulmonary disease, unspecified: Secondary | ICD-10-CM | POA: Diagnosis not present

## 2021-06-30 DIAGNOSIS — K219 Gastro-esophageal reflux disease without esophagitis: Secondary | ICD-10-CM | POA: Diagnosis not present

## 2021-06-30 DIAGNOSIS — D649 Anemia, unspecified: Secondary | ICD-10-CM | POA: Diagnosis not present

## 2021-06-30 DIAGNOSIS — E559 Vitamin D deficiency, unspecified: Secondary | ICD-10-CM | POA: Diagnosis not present

## 2021-06-30 DIAGNOSIS — I509 Heart failure, unspecified: Secondary | ICD-10-CM | POA: Diagnosis not present

## 2021-06-30 DIAGNOSIS — R69 Illness, unspecified: Secondary | ICD-10-CM | POA: Diagnosis not present

## 2021-06-30 DIAGNOSIS — J441 Chronic obstructive pulmonary disease with (acute) exacerbation: Secondary | ICD-10-CM | POA: Diagnosis not present

## 2021-06-30 DIAGNOSIS — J9611 Chronic respiratory failure with hypoxia: Secondary | ICD-10-CM | POA: Diagnosis not present

## 2021-06-30 DIAGNOSIS — M810 Age-related osteoporosis without current pathological fracture: Secondary | ICD-10-CM | POA: Diagnosis not present

## 2021-06-30 DIAGNOSIS — I110 Hypertensive heart disease with heart failure: Secondary | ICD-10-CM | POA: Diagnosis not present

## 2021-06-30 DIAGNOSIS — K5900 Constipation, unspecified: Secondary | ICD-10-CM | POA: Diagnosis not present

## 2021-06-30 DIAGNOSIS — I480 Paroxysmal atrial fibrillation: Secondary | ICD-10-CM | POA: Diagnosis not present

## 2021-07-02 DIAGNOSIS — J449 Chronic obstructive pulmonary disease, unspecified: Secondary | ICD-10-CM | POA: Diagnosis not present

## 2021-07-02 DIAGNOSIS — I509 Heart failure, unspecified: Secondary | ICD-10-CM | POA: Diagnosis not present

## 2021-07-02 DIAGNOSIS — I4819 Other persistent atrial fibrillation: Secondary | ICD-10-CM | POA: Diagnosis not present

## 2021-07-02 DIAGNOSIS — J9611 Chronic respiratory failure with hypoxia: Secondary | ICD-10-CM | POA: Diagnosis not present

## 2021-07-02 DIAGNOSIS — K5900 Constipation, unspecified: Secondary | ICD-10-CM | POA: Diagnosis not present

## 2021-07-02 DIAGNOSIS — D649 Anemia, unspecified: Secondary | ICD-10-CM | POA: Diagnosis not present

## 2021-07-02 DIAGNOSIS — I779 Disorder of arteries and arterioles, unspecified: Secondary | ICD-10-CM | POA: Diagnosis not present

## 2021-07-02 DIAGNOSIS — M810 Age-related osteoporosis without current pathological fracture: Secondary | ICD-10-CM | POA: Diagnosis not present

## 2021-07-02 DIAGNOSIS — I480 Paroxysmal atrial fibrillation: Secondary | ICD-10-CM | POA: Diagnosis not present

## 2021-07-02 DIAGNOSIS — I4891 Unspecified atrial fibrillation: Secondary | ICD-10-CM | POA: Diagnosis not present

## 2021-07-02 DIAGNOSIS — R69 Illness, unspecified: Secondary | ICD-10-CM | POA: Diagnosis not present

## 2021-07-02 DIAGNOSIS — I493 Ventricular premature depolarization: Secondary | ICD-10-CM | POA: Diagnosis not present

## 2021-07-02 DIAGNOSIS — I110 Hypertensive heart disease with heart failure: Secondary | ICD-10-CM | POA: Diagnosis not present

## 2021-07-02 DIAGNOSIS — K219 Gastro-esophageal reflux disease without esophagitis: Secondary | ICD-10-CM | POA: Diagnosis not present

## 2021-07-02 DIAGNOSIS — I471 Supraventricular tachycardia: Secondary | ICD-10-CM | POA: Diagnosis not present

## 2021-07-02 DIAGNOSIS — J441 Chronic obstructive pulmonary disease with (acute) exacerbation: Secondary | ICD-10-CM | POA: Diagnosis not present

## 2021-07-02 DIAGNOSIS — R609 Edema, unspecified: Secondary | ICD-10-CM | POA: Diagnosis not present

## 2021-07-02 DIAGNOSIS — Z720 Tobacco use: Secondary | ICD-10-CM | POA: Diagnosis not present

## 2021-07-06 DIAGNOSIS — R609 Edema, unspecified: Secondary | ICD-10-CM | POA: Diagnosis not present

## 2021-07-06 DIAGNOSIS — J9611 Chronic respiratory failure with hypoxia: Secondary | ICD-10-CM | POA: Diagnosis not present

## 2021-07-06 DIAGNOSIS — R69 Illness, unspecified: Secondary | ICD-10-CM | POA: Diagnosis not present

## 2021-07-06 DIAGNOSIS — Z9889 Other specified postprocedural states: Secondary | ICD-10-CM | POA: Diagnosis not present

## 2021-07-06 DIAGNOSIS — F17210 Nicotine dependence, cigarettes, uncomplicated: Secondary | ICD-10-CM | POA: Diagnosis not present

## 2021-07-06 DIAGNOSIS — I4891 Unspecified atrial fibrillation: Secondary | ICD-10-CM | POA: Diagnosis not present

## 2021-07-06 DIAGNOSIS — I272 Pulmonary hypertension, unspecified: Secondary | ICD-10-CM | POA: Diagnosis not present

## 2021-07-06 DIAGNOSIS — J449 Chronic obstructive pulmonary disease, unspecified: Secondary | ICD-10-CM | POA: Diagnosis not present

## 2021-07-06 DIAGNOSIS — I471 Supraventricular tachycardia: Secondary | ICD-10-CM | POA: Diagnosis not present

## 2021-07-06 DIAGNOSIS — G4733 Obstructive sleep apnea (adult) (pediatric): Secondary | ICD-10-CM | POA: Diagnosis not present

## 2021-07-06 DIAGNOSIS — Z9981 Dependence on supplemental oxygen: Secondary | ICD-10-CM | POA: Diagnosis not present

## 2021-07-07 DIAGNOSIS — J441 Chronic obstructive pulmonary disease with (acute) exacerbation: Secondary | ICD-10-CM | POA: Diagnosis not present

## 2021-07-07 DIAGNOSIS — I480 Paroxysmal atrial fibrillation: Secondary | ICD-10-CM | POA: Diagnosis not present

## 2021-07-07 DIAGNOSIS — D649 Anemia, unspecified: Secondary | ICD-10-CM | POA: Diagnosis not present

## 2021-07-07 DIAGNOSIS — I509 Heart failure, unspecified: Secondary | ICD-10-CM | POA: Diagnosis not present

## 2021-07-07 DIAGNOSIS — K219 Gastro-esophageal reflux disease without esophagitis: Secondary | ICD-10-CM | POA: Diagnosis not present

## 2021-07-07 DIAGNOSIS — M810 Age-related osteoporosis without current pathological fracture: Secondary | ICD-10-CM | POA: Diagnosis not present

## 2021-07-07 DIAGNOSIS — K5900 Constipation, unspecified: Secondary | ICD-10-CM | POA: Diagnosis not present

## 2021-07-07 DIAGNOSIS — I110 Hypertensive heart disease with heart failure: Secondary | ICD-10-CM | POA: Diagnosis not present

## 2021-07-07 DIAGNOSIS — R69 Illness, unspecified: Secondary | ICD-10-CM | POA: Diagnosis not present

## 2021-07-07 DIAGNOSIS — J9611 Chronic respiratory failure with hypoxia: Secondary | ICD-10-CM | POA: Diagnosis not present

## 2021-07-08 DIAGNOSIS — I4819 Other persistent atrial fibrillation: Secondary | ICD-10-CM | POA: Diagnosis not present

## 2021-07-08 DIAGNOSIS — R609 Edema, unspecified: Secondary | ICD-10-CM | POA: Diagnosis not present

## 2021-07-09 DIAGNOSIS — I10 Essential (primary) hypertension: Secondary | ICD-10-CM | POA: Diagnosis not present

## 2021-07-09 DIAGNOSIS — Z720 Tobacco use: Secondary | ICD-10-CM | POA: Diagnosis not present

## 2021-07-09 DIAGNOSIS — I471 Supraventricular tachycardia: Secondary | ICD-10-CM | POA: Diagnosis not present

## 2021-07-09 DIAGNOSIS — I4891 Unspecified atrial fibrillation: Secondary | ICD-10-CM | POA: Diagnosis not present

## 2021-07-09 DIAGNOSIS — J449 Chronic obstructive pulmonary disease, unspecified: Secondary | ICD-10-CM | POA: Diagnosis not present

## 2021-07-09 DIAGNOSIS — I4819 Other persistent atrial fibrillation: Secondary | ICD-10-CM | POA: Diagnosis not present

## 2021-07-09 DIAGNOSIS — E785 Hyperlipidemia, unspecified: Secondary | ICD-10-CM | POA: Diagnosis not present

## 2021-07-09 DIAGNOSIS — M7989 Other specified soft tissue disorders: Secondary | ICD-10-CM | POA: Diagnosis not present

## 2021-07-09 DIAGNOSIS — Z9889 Other specified postprocedural states: Secondary | ICD-10-CM | POA: Diagnosis not present

## 2021-07-09 DIAGNOSIS — I739 Peripheral vascular disease, unspecified: Secondary | ICD-10-CM | POA: Diagnosis not present

## 2021-07-10 DIAGNOSIS — I4819 Other persistent atrial fibrillation: Secondary | ICD-10-CM | POA: Diagnosis not present

## 2021-07-10 DIAGNOSIS — I4891 Unspecified atrial fibrillation: Secondary | ICD-10-CM | POA: Diagnosis not present

## 2021-07-12 DIAGNOSIS — J449 Chronic obstructive pulmonary disease, unspecified: Secondary | ICD-10-CM | POA: Diagnosis not present

## 2021-07-13 DIAGNOSIS — R69 Illness, unspecified: Secondary | ICD-10-CM | POA: Diagnosis not present

## 2021-07-13 DIAGNOSIS — K219 Gastro-esophageal reflux disease without esophagitis: Secondary | ICD-10-CM | POA: Diagnosis not present

## 2021-07-13 DIAGNOSIS — I480 Paroxysmal atrial fibrillation: Secondary | ICD-10-CM | POA: Diagnosis not present

## 2021-07-13 DIAGNOSIS — K5900 Constipation, unspecified: Secondary | ICD-10-CM | POA: Diagnosis not present

## 2021-07-13 DIAGNOSIS — J441 Chronic obstructive pulmonary disease with (acute) exacerbation: Secondary | ICD-10-CM | POA: Diagnosis not present

## 2021-07-13 DIAGNOSIS — I110 Hypertensive heart disease with heart failure: Secondary | ICD-10-CM | POA: Diagnosis not present

## 2021-07-13 DIAGNOSIS — M810 Age-related osteoporosis without current pathological fracture: Secondary | ICD-10-CM | POA: Diagnosis not present

## 2021-07-13 DIAGNOSIS — I509 Heart failure, unspecified: Secondary | ICD-10-CM | POA: Diagnosis not present

## 2021-07-13 DIAGNOSIS — J9611 Chronic respiratory failure with hypoxia: Secondary | ICD-10-CM | POA: Diagnosis not present

## 2021-07-13 DIAGNOSIS — D649 Anemia, unspecified: Secondary | ICD-10-CM | POA: Diagnosis not present

## 2021-07-14 DIAGNOSIS — Z1331 Encounter for screening for depression: Secondary | ICD-10-CM | POA: Diagnosis not present

## 2021-07-14 DIAGNOSIS — D72829 Elevated white blood cell count, unspecified: Secondary | ICD-10-CM | POA: Diagnosis not present

## 2021-07-14 DIAGNOSIS — J441 Chronic obstructive pulmonary disease with (acute) exacerbation: Secondary | ICD-10-CM | POA: Diagnosis not present

## 2021-07-14 DIAGNOSIS — Z6822 Body mass index (BMI) 22.0-22.9, adult: Secondary | ICD-10-CM | POA: Diagnosis not present

## 2021-07-20 DIAGNOSIS — R7989 Other specified abnormal findings of blood chemistry: Secondary | ICD-10-CM | POA: Diagnosis not present

## 2021-07-20 DIAGNOSIS — H903 Sensorineural hearing loss, bilateral: Secondary | ICD-10-CM | POA: Diagnosis not present

## 2021-07-21 DIAGNOSIS — J9611 Chronic respiratory failure with hypoxia: Secondary | ICD-10-CM | POA: Diagnosis not present

## 2021-07-21 DIAGNOSIS — K219 Gastro-esophageal reflux disease without esophagitis: Secondary | ICD-10-CM | POA: Diagnosis not present

## 2021-07-21 DIAGNOSIS — I509 Heart failure, unspecified: Secondary | ICD-10-CM | POA: Diagnosis not present

## 2021-07-21 DIAGNOSIS — J441 Chronic obstructive pulmonary disease with (acute) exacerbation: Secondary | ICD-10-CM | POA: Diagnosis not present

## 2021-07-21 DIAGNOSIS — M810 Age-related osteoporosis without current pathological fracture: Secondary | ICD-10-CM | POA: Diagnosis not present

## 2021-07-21 DIAGNOSIS — D649 Anemia, unspecified: Secondary | ICD-10-CM | POA: Diagnosis not present

## 2021-07-21 DIAGNOSIS — K5900 Constipation, unspecified: Secondary | ICD-10-CM | POA: Diagnosis not present

## 2021-07-21 DIAGNOSIS — I110 Hypertensive heart disease with heart failure: Secondary | ICD-10-CM | POA: Diagnosis not present

## 2021-07-21 DIAGNOSIS — I480 Paroxysmal atrial fibrillation: Secondary | ICD-10-CM | POA: Diagnosis not present

## 2021-07-21 DIAGNOSIS — R69 Illness, unspecified: Secondary | ICD-10-CM | POA: Diagnosis not present

## 2021-07-24 DIAGNOSIS — J449 Chronic obstructive pulmonary disease, unspecified: Secondary | ICD-10-CM | POA: Diagnosis not present

## 2021-07-30 DIAGNOSIS — I4819 Other persistent atrial fibrillation: Secondary | ICD-10-CM | POA: Diagnosis not present

## 2021-08-03 DIAGNOSIS — I10 Essential (primary) hypertension: Secondary | ICD-10-CM | POA: Diagnosis not present

## 2021-08-03 DIAGNOSIS — G8929 Other chronic pain: Secondary | ICD-10-CM | POA: Diagnosis not present

## 2021-08-03 DIAGNOSIS — R69 Illness, unspecified: Secondary | ICD-10-CM | POA: Diagnosis not present

## 2021-08-03 DIAGNOSIS — J309 Allergic rhinitis, unspecified: Secondary | ICD-10-CM | POA: Diagnosis not present

## 2021-08-03 DIAGNOSIS — J439 Emphysema, unspecified: Secondary | ICD-10-CM | POA: Diagnosis not present

## 2021-08-03 DIAGNOSIS — D6869 Other thrombophilia: Secondary | ICD-10-CM | POA: Diagnosis not present

## 2021-08-03 DIAGNOSIS — I4891 Unspecified atrial fibrillation: Secondary | ICD-10-CM | POA: Diagnosis not present

## 2021-08-04 DIAGNOSIS — Z9889 Other specified postprocedural states: Secondary | ICD-10-CM | POA: Diagnosis not present

## 2021-08-04 DIAGNOSIS — I4819 Other persistent atrial fibrillation: Secondary | ICD-10-CM | POA: Diagnosis not present

## 2021-08-04 DIAGNOSIS — I471 Supraventricular tachycardia: Secondary | ICD-10-CM | POA: Diagnosis not present

## 2021-08-04 DIAGNOSIS — Z720 Tobacco use: Secondary | ICD-10-CM | POA: Diagnosis not present

## 2021-08-04 DIAGNOSIS — R609 Edema, unspecified: Secondary | ICD-10-CM | POA: Diagnosis not present

## 2021-08-04 DIAGNOSIS — J449 Chronic obstructive pulmonary disease, unspecified: Secondary | ICD-10-CM | POA: Diagnosis not present

## 2021-08-04 DIAGNOSIS — I779 Disorder of arteries and arterioles, unspecified: Secondary | ICD-10-CM | POA: Diagnosis not present

## 2021-08-06 DIAGNOSIS — I509 Heart failure, unspecified: Secondary | ICD-10-CM | POA: Diagnosis not present

## 2021-08-06 DIAGNOSIS — I480 Paroxysmal atrial fibrillation: Secondary | ICD-10-CM | POA: Diagnosis not present

## 2021-08-06 DIAGNOSIS — R69 Illness, unspecified: Secondary | ICD-10-CM | POA: Diagnosis not present

## 2021-08-06 DIAGNOSIS — K219 Gastro-esophageal reflux disease without esophagitis: Secondary | ICD-10-CM | POA: Diagnosis not present

## 2021-08-06 DIAGNOSIS — J9611 Chronic respiratory failure with hypoxia: Secondary | ICD-10-CM | POA: Diagnosis not present

## 2021-08-06 DIAGNOSIS — J441 Chronic obstructive pulmonary disease with (acute) exacerbation: Secondary | ICD-10-CM | POA: Diagnosis not present

## 2021-08-06 DIAGNOSIS — M810 Age-related osteoporosis without current pathological fracture: Secondary | ICD-10-CM | POA: Diagnosis not present

## 2021-08-06 DIAGNOSIS — D649 Anemia, unspecified: Secondary | ICD-10-CM | POA: Diagnosis not present

## 2021-08-06 DIAGNOSIS — K5900 Constipation, unspecified: Secondary | ICD-10-CM | POA: Diagnosis not present

## 2021-08-06 DIAGNOSIS — I110 Hypertensive heart disease with heart failure: Secondary | ICD-10-CM | POA: Diagnosis not present

## 2021-08-08 DIAGNOSIS — R062 Wheezing: Secondary | ICD-10-CM | POA: Diagnosis not present

## 2021-08-10 DIAGNOSIS — D649 Anemia, unspecified: Secondary | ICD-10-CM | POA: Diagnosis not present

## 2021-08-10 DIAGNOSIS — J9611 Chronic respiratory failure with hypoxia: Secondary | ICD-10-CM | POA: Diagnosis not present

## 2021-08-10 DIAGNOSIS — K5900 Constipation, unspecified: Secondary | ICD-10-CM | POA: Diagnosis not present

## 2021-08-10 DIAGNOSIS — J441 Chronic obstructive pulmonary disease with (acute) exacerbation: Secondary | ICD-10-CM | POA: Diagnosis not present

## 2021-08-10 DIAGNOSIS — Z6823 Body mass index (BMI) 23.0-23.9, adult: Secondary | ICD-10-CM | POA: Diagnosis not present

## 2021-08-10 DIAGNOSIS — K219 Gastro-esophageal reflux disease without esophagitis: Secondary | ICD-10-CM | POA: Diagnosis not present

## 2021-08-10 DIAGNOSIS — I110 Hypertensive heart disease with heart failure: Secondary | ICD-10-CM | POA: Diagnosis not present

## 2021-08-10 DIAGNOSIS — I509 Heart failure, unspecified: Secondary | ICD-10-CM | POA: Diagnosis not present

## 2021-08-10 DIAGNOSIS — R69 Illness, unspecified: Secondary | ICD-10-CM | POA: Diagnosis not present

## 2021-08-10 DIAGNOSIS — M810 Age-related osteoporosis without current pathological fracture: Secondary | ICD-10-CM | POA: Diagnosis not present

## 2021-08-10 DIAGNOSIS — I480 Paroxysmal atrial fibrillation: Secondary | ICD-10-CM | POA: Diagnosis not present

## 2021-08-11 DIAGNOSIS — I739 Peripheral vascular disease, unspecified: Secondary | ICD-10-CM | POA: Diagnosis not present

## 2021-08-11 DIAGNOSIS — R609 Edema, unspecified: Secondary | ICD-10-CM | POA: Diagnosis not present

## 2021-08-11 DIAGNOSIS — I4819 Other persistent atrial fibrillation: Secondary | ICD-10-CM | POA: Diagnosis not present

## 2021-08-11 DIAGNOSIS — I4892 Unspecified atrial flutter: Secondary | ICD-10-CM | POA: Diagnosis not present

## 2021-08-11 DIAGNOSIS — I471 Supraventricular tachycardia: Secondary | ICD-10-CM | POA: Diagnosis not present

## 2021-08-11 DIAGNOSIS — R0602 Shortness of breath: Secondary | ICD-10-CM | POA: Diagnosis not present

## 2021-08-11 DIAGNOSIS — I779 Disorder of arteries and arterioles, unspecified: Secondary | ICD-10-CM | POA: Diagnosis not present

## 2021-08-11 DIAGNOSIS — I10 Essential (primary) hypertension: Secondary | ICD-10-CM | POA: Diagnosis not present

## 2021-08-11 DIAGNOSIS — E785 Hyperlipidemia, unspecified: Secondary | ICD-10-CM | POA: Diagnosis not present

## 2021-08-11 DIAGNOSIS — J449 Chronic obstructive pulmonary disease, unspecified: Secondary | ICD-10-CM | POA: Diagnosis not present

## 2021-08-12 DIAGNOSIS — I4892 Unspecified atrial flutter: Secondary | ICD-10-CM | POA: Diagnosis not present

## 2021-08-18 DIAGNOSIS — J441 Chronic obstructive pulmonary disease with (acute) exacerbation: Secondary | ICD-10-CM | POA: Diagnosis not present

## 2021-08-18 DIAGNOSIS — R69 Illness, unspecified: Secondary | ICD-10-CM | POA: Diagnosis not present

## 2021-08-18 DIAGNOSIS — I480 Paroxysmal atrial fibrillation: Secondary | ICD-10-CM | POA: Diagnosis not present

## 2021-08-18 DIAGNOSIS — I509 Heart failure, unspecified: Secondary | ICD-10-CM | POA: Diagnosis not present

## 2021-08-18 DIAGNOSIS — D649 Anemia, unspecified: Secondary | ICD-10-CM | POA: Diagnosis not present

## 2021-08-18 DIAGNOSIS — K5900 Constipation, unspecified: Secondary | ICD-10-CM | POA: Diagnosis not present

## 2021-08-18 DIAGNOSIS — K219 Gastro-esophageal reflux disease without esophagitis: Secondary | ICD-10-CM | POA: Diagnosis not present

## 2021-08-18 DIAGNOSIS — J9611 Chronic respiratory failure with hypoxia: Secondary | ICD-10-CM | POA: Diagnosis not present

## 2021-08-18 DIAGNOSIS — M810 Age-related osteoporosis without current pathological fracture: Secondary | ICD-10-CM | POA: Diagnosis not present

## 2021-08-18 DIAGNOSIS — I110 Hypertensive heart disease with heart failure: Secondary | ICD-10-CM | POA: Diagnosis not present

## 2021-08-24 DIAGNOSIS — J449 Chronic obstructive pulmonary disease, unspecified: Secondary | ICD-10-CM | POA: Diagnosis not present

## 2021-08-26 DIAGNOSIS — Z23 Encounter for immunization: Secondary | ICD-10-CM | POA: Diagnosis not present

## 2021-09-11 DIAGNOSIS — J449 Chronic obstructive pulmonary disease, unspecified: Secondary | ICD-10-CM | POA: Diagnosis not present

## 2021-09-21 DIAGNOSIS — G4733 Obstructive sleep apnea (adult) (pediatric): Secondary | ICD-10-CM | POA: Diagnosis not present

## 2021-09-22 DIAGNOSIS — M4854XA Collapsed vertebra, not elsewhere classified, thoracic region, initial encounter for fracture: Secondary | ICD-10-CM | POA: Diagnosis not present

## 2021-09-22 DIAGNOSIS — I2510 Atherosclerotic heart disease of native coronary artery without angina pectoris: Secondary | ICD-10-CM | POA: Diagnosis not present

## 2021-09-22 DIAGNOSIS — R0600 Dyspnea, unspecified: Secondary | ICD-10-CM | POA: Diagnosis not present

## 2021-09-22 DIAGNOSIS — N200 Calculus of kidney: Secondary | ICD-10-CM | POA: Diagnosis not present

## 2021-09-22 DIAGNOSIS — J439 Emphysema, unspecified: Secondary | ICD-10-CM | POA: Diagnosis not present

## 2021-09-22 DIAGNOSIS — R69 Illness, unspecified: Secondary | ICD-10-CM | POA: Diagnosis not present

## 2021-09-22 DIAGNOSIS — J449 Chronic obstructive pulmonary disease, unspecified: Secondary | ICD-10-CM | POA: Diagnosis not present

## 2021-09-22 DIAGNOSIS — I700 Atherosclerosis of aorta: Secondary | ICD-10-CM | POA: Diagnosis not present

## 2021-09-22 DIAGNOSIS — J90 Pleural effusion, not elsewhere classified: Secondary | ICD-10-CM | POA: Diagnosis not present

## 2021-09-22 DIAGNOSIS — J984 Other disorders of lung: Secondary | ICD-10-CM | POA: Diagnosis not present

## 2021-09-23 DIAGNOSIS — J449 Chronic obstructive pulmonary disease, unspecified: Secondary | ICD-10-CM | POA: Diagnosis not present

## 2021-09-27 DIAGNOSIS — Z0100 Encounter for examination of eyes and vision without abnormal findings: Secondary | ICD-10-CM | POA: Diagnosis not present

## 2021-09-27 DIAGNOSIS — H40003 Preglaucoma, unspecified, bilateral: Secondary | ICD-10-CM | POA: Diagnosis not present

## 2021-09-29 DIAGNOSIS — R053 Chronic cough: Secondary | ICD-10-CM | POA: Diagnosis not present

## 2021-09-29 DIAGNOSIS — I4819 Other persistent atrial fibrillation: Secondary | ICD-10-CM | POA: Diagnosis not present

## 2021-09-29 DIAGNOSIS — J449 Chronic obstructive pulmonary disease, unspecified: Secondary | ICD-10-CM | POA: Diagnosis not present

## 2021-09-29 DIAGNOSIS — I471 Supraventricular tachycardia: Secondary | ICD-10-CM | POA: Diagnosis not present

## 2021-09-29 DIAGNOSIS — G4733 Obstructive sleep apnea (adult) (pediatric): Secondary | ICD-10-CM | POA: Diagnosis not present

## 2021-10-04 ENCOUNTER — Ambulatory Visit: Payer: Medicare HMO | Admitting: Allergy and Immunology

## 2021-10-04 ENCOUNTER — Other Ambulatory Visit: Payer: Self-pay | Admitting: Allergy and Immunology

## 2021-10-11 DIAGNOSIS — J449 Chronic obstructive pulmonary disease, unspecified: Secondary | ICD-10-CM | POA: Diagnosis not present

## 2021-10-22 DIAGNOSIS — J441 Chronic obstructive pulmonary disease with (acute) exacerbation: Secondary | ICD-10-CM | POA: Diagnosis not present

## 2021-10-24 DIAGNOSIS — J449 Chronic obstructive pulmonary disease, unspecified: Secondary | ICD-10-CM | POA: Diagnosis not present

## 2021-10-26 DIAGNOSIS — I779 Disorder of arteries and arterioles, unspecified: Secondary | ICD-10-CM | POA: Diagnosis not present

## 2021-10-26 DIAGNOSIS — I4819 Other persistent atrial fibrillation: Secondary | ICD-10-CM | POA: Diagnosis not present

## 2021-10-26 DIAGNOSIS — G4733 Obstructive sleep apnea (adult) (pediatric): Secondary | ICD-10-CM | POA: Diagnosis not present

## 2021-10-26 DIAGNOSIS — I4891 Unspecified atrial fibrillation: Secondary | ICD-10-CM | POA: Diagnosis not present

## 2021-10-26 DIAGNOSIS — I471 Supraventricular tachycardia: Secondary | ICD-10-CM | POA: Diagnosis not present

## 2021-10-26 DIAGNOSIS — Z9889 Other specified postprocedural states: Secondary | ICD-10-CM | POA: Diagnosis not present

## 2021-10-26 DIAGNOSIS — J449 Chronic obstructive pulmonary disease, unspecified: Secondary | ICD-10-CM | POA: Diagnosis not present

## 2021-10-26 DIAGNOSIS — I10 Essential (primary) hypertension: Secondary | ICD-10-CM | POA: Diagnosis not present

## 2021-10-26 DIAGNOSIS — E785 Hyperlipidemia, unspecified: Secondary | ICD-10-CM | POA: Diagnosis not present

## 2021-10-26 DIAGNOSIS — I739 Peripheral vascular disease, unspecified: Secondary | ICD-10-CM | POA: Diagnosis not present

## 2021-11-01 ENCOUNTER — Other Ambulatory Visit: Payer: Self-pay | Admitting: Allergy and Immunology

## 2021-11-03 DIAGNOSIS — Z87891 Personal history of nicotine dependence: Secondary | ICD-10-CM | POA: Diagnosis not present

## 2021-11-03 DIAGNOSIS — I4891 Unspecified atrial fibrillation: Secondary | ICD-10-CM | POA: Diagnosis not present

## 2021-11-03 DIAGNOSIS — I4819 Other persistent atrial fibrillation: Secondary | ICD-10-CM | POA: Diagnosis not present

## 2021-11-03 DIAGNOSIS — I10 Essential (primary) hypertension: Secondary | ICD-10-CM | POA: Diagnosis not present

## 2021-11-03 DIAGNOSIS — I6523 Occlusion and stenosis of bilateral carotid arteries: Secondary | ICD-10-CM | POA: Diagnosis not present

## 2021-11-11 ENCOUNTER — Encounter (HOSPITAL_COMMUNITY): Payer: Medicare HMO

## 2021-11-11 ENCOUNTER — Ambulatory Visit: Payer: Medicare HMO | Admitting: Vascular Surgery

## 2021-11-11 DIAGNOSIS — J449 Chronic obstructive pulmonary disease, unspecified: Secondary | ICD-10-CM | POA: Diagnosis not present

## 2021-11-15 ENCOUNTER — Other Ambulatory Visit: Payer: Self-pay

## 2021-11-15 DIAGNOSIS — Z9889 Other specified postprocedural states: Secondary | ICD-10-CM

## 2021-11-17 DIAGNOSIS — I4891 Unspecified atrial fibrillation: Secondary | ICD-10-CM | POA: Diagnosis not present

## 2021-11-17 DIAGNOSIS — I4819 Other persistent atrial fibrillation: Secondary | ICD-10-CM | POA: Diagnosis not present

## 2021-11-17 DIAGNOSIS — Z9889 Other specified postprocedural states: Secondary | ICD-10-CM | POA: Diagnosis not present

## 2021-11-17 DIAGNOSIS — I10 Essential (primary) hypertension: Secondary | ICD-10-CM | POA: Diagnosis not present

## 2021-11-18 ENCOUNTER — Other Ambulatory Visit: Payer: Self-pay | Admitting: Allergy and Immunology

## 2021-11-24 DIAGNOSIS — J449 Chronic obstructive pulmonary disease, unspecified: Secondary | ICD-10-CM | POA: Diagnosis not present

## 2021-11-25 ENCOUNTER — Inpatient Hospital Stay (HOSPITAL_COMMUNITY): Admission: RE | Admit: 2021-11-25 | Payer: Medicare HMO | Source: Ambulatory Visit

## 2021-11-25 ENCOUNTER — Ambulatory Visit: Payer: Medicare HMO | Admitting: Vascular Surgery

## 2021-11-30 ENCOUNTER — Other Ambulatory Visit: Payer: Self-pay | Admitting: Allergy and Immunology

## 2021-12-02 ENCOUNTER — Other Ambulatory Visit: Payer: Self-pay | Admitting: Allergy and Immunology

## 2021-12-06 DIAGNOSIS — T148XXA Other injury of unspecified body region, initial encounter: Secondary | ICD-10-CM | POA: Diagnosis not present

## 2021-12-06 DIAGNOSIS — R233 Spontaneous ecchymoses: Secondary | ICD-10-CM | POA: Diagnosis not present

## 2021-12-06 DIAGNOSIS — L280 Lichen simplex chronicus: Secondary | ICD-10-CM | POA: Diagnosis not present

## 2021-12-08 DIAGNOSIS — L97811 Non-pressure chronic ulcer of other part of right lower leg limited to breakdown of skin: Secondary | ICD-10-CM | POA: Diagnosis not present

## 2021-12-08 DIAGNOSIS — R233 Spontaneous ecchymoses: Secondary | ICD-10-CM | POA: Diagnosis not present

## 2021-12-12 DIAGNOSIS — J449 Chronic obstructive pulmonary disease, unspecified: Secondary | ICD-10-CM | POA: Diagnosis not present

## 2021-12-13 ENCOUNTER — Other Ambulatory Visit: Payer: Self-pay | Admitting: Allergy and Immunology

## 2021-12-14 ENCOUNTER — Other Ambulatory Visit: Payer: Self-pay | Admitting: *Deleted

## 2021-12-14 ENCOUNTER — Telehealth: Payer: Self-pay | Admitting: Allergy and Immunology

## 2021-12-14 MED ORDER — PANTOPRAZOLE SODIUM 40 MG PO TBEC: 40.0000 mg | DELAYED_RELEASE_TABLET | Freq: Every day | ORAL | 0 refills | Status: DC

## 2021-12-14 MED ORDER — PANTOPRAZOLE SODIUM 40 MG PO TBEC: 40.0000 mg | DELAYED_RELEASE_TABLET | Freq: Every day | ORAL | 5 refills | Status: DC

## 2021-12-14 NOTE — Telephone Encounter (Signed)
Refill has been sent in.  

## 2021-12-14 NOTE — Telephone Encounter (Signed)
I sent one 30-day supply. This is the last refill we will be able to send until she is seen.

## 2021-12-14 NOTE — Telephone Encounter (Signed)
Patients spouse is requesting a refill on pantoprazole. I informed spouse that patient is needing an OV and did schedule for 3/1. Patients leg is currently injured and can't really get around at the moment.

## 2021-12-15 DIAGNOSIS — L97811 Non-pressure chronic ulcer of other part of right lower leg limited to breakdown of skin: Secondary | ICD-10-CM | POA: Diagnosis not present

## 2021-12-15 DIAGNOSIS — R233 Spontaneous ecchymoses: Secondary | ICD-10-CM | POA: Diagnosis not present

## 2021-12-22 ENCOUNTER — Ambulatory Visit: Payer: Medicare HMO | Admitting: Allergy and Immunology

## 2021-12-22 DIAGNOSIS — J449 Chronic obstructive pulmonary disease, unspecified: Secondary | ICD-10-CM | POA: Diagnosis not present

## 2021-12-27 DIAGNOSIS — S81801A Unspecified open wound, right lower leg, initial encounter: Secondary | ICD-10-CM | POA: Diagnosis not present

## 2021-12-27 DIAGNOSIS — Z6823 Body mass index (BMI) 23.0-23.9, adult: Secondary | ICD-10-CM | POA: Diagnosis not present

## 2021-12-27 DIAGNOSIS — L84 Corns and callosities: Secondary | ICD-10-CM | POA: Diagnosis not present

## 2021-12-28 DIAGNOSIS — J449 Chronic obstructive pulmonary disease, unspecified: Secondary | ICD-10-CM | POA: Diagnosis not present

## 2021-12-28 DIAGNOSIS — G4733 Obstructive sleep apnea (adult) (pediatric): Secondary | ICD-10-CM | POA: Diagnosis not present

## 2021-12-28 DIAGNOSIS — I4819 Other persistent atrial fibrillation: Secondary | ICD-10-CM | POA: Diagnosis not present

## 2021-12-28 DIAGNOSIS — R053 Chronic cough: Secondary | ICD-10-CM | POA: Diagnosis not present

## 2021-12-31 DIAGNOSIS — L97919 Non-pressure chronic ulcer of unspecified part of right lower leg with unspecified severity: Secondary | ICD-10-CM | POA: Diagnosis not present

## 2022-01-07 DIAGNOSIS — I272 Pulmonary hypertension, unspecified: Secondary | ICD-10-CM | POA: Diagnosis not present

## 2022-01-07 DIAGNOSIS — I10 Essential (primary) hypertension: Secondary | ICD-10-CM | POA: Diagnosis not present

## 2022-01-07 DIAGNOSIS — J449 Chronic obstructive pulmonary disease, unspecified: Secondary | ICD-10-CM | POA: Diagnosis not present

## 2022-01-07 DIAGNOSIS — J441 Chronic obstructive pulmonary disease with (acute) exacerbation: Secondary | ICD-10-CM | POA: Diagnosis not present

## 2022-01-07 DIAGNOSIS — Z9889 Other specified postprocedural states: Secondary | ICD-10-CM | POA: Diagnosis not present

## 2022-01-07 DIAGNOSIS — I779 Disorder of arteries and arterioles, unspecified: Secondary | ICD-10-CM | POA: Diagnosis not present

## 2022-01-07 DIAGNOSIS — I4819 Other persistent atrial fibrillation: Secondary | ICD-10-CM | POA: Diagnosis not present

## 2022-01-07 DIAGNOSIS — I471 Supraventricular tachycardia: Secondary | ICD-10-CM | POA: Diagnosis not present

## 2022-01-08 DIAGNOSIS — I4819 Other persistent atrial fibrillation: Secondary | ICD-10-CM | POA: Diagnosis not present

## 2022-01-09 DIAGNOSIS — J449 Chronic obstructive pulmonary disease, unspecified: Secondary | ICD-10-CM | POA: Diagnosis not present

## 2022-01-12 DIAGNOSIS — R339 Retention of urine, unspecified: Secondary | ICD-10-CM | POA: Diagnosis not present

## 2022-01-14 DIAGNOSIS — Z79899 Other long term (current) drug therapy: Secondary | ICD-10-CM | POA: Diagnosis not present

## 2022-01-14 DIAGNOSIS — Z0000 Encounter for general adult medical examination without abnormal findings: Secondary | ICD-10-CM | POA: Diagnosis not present

## 2022-01-17 DIAGNOSIS — L84 Corns and callosities: Secondary | ICD-10-CM | POA: Diagnosis not present

## 2022-01-17 DIAGNOSIS — I739 Peripheral vascular disease, unspecified: Secondary | ICD-10-CM | POA: Diagnosis not present

## 2022-01-18 DIAGNOSIS — D72829 Elevated white blood cell count, unspecified: Secondary | ICD-10-CM | POA: Diagnosis not present

## 2022-01-18 DIAGNOSIS — J9601 Acute respiratory failure with hypoxia: Secondary | ICD-10-CM | POA: Diagnosis not present

## 2022-01-18 DIAGNOSIS — J9622 Acute and chronic respiratory failure with hypercapnia: Secondary | ICD-10-CM | POA: Diagnosis not present

## 2022-01-18 DIAGNOSIS — E876 Hypokalemia: Secondary | ICD-10-CM | POA: Diagnosis not present

## 2022-01-18 DIAGNOSIS — R059 Cough, unspecified: Secondary | ICD-10-CM | POA: Diagnosis not present

## 2022-01-18 DIAGNOSIS — I5032 Chronic diastolic (congestive) heart failure: Secondary | ICD-10-CM | POA: Diagnosis not present

## 2022-01-18 DIAGNOSIS — Z743 Need for continuous supervision: Secondary | ICD-10-CM | POA: Diagnosis not present

## 2022-01-18 DIAGNOSIS — I272 Pulmonary hypertension, unspecified: Secondary | ICD-10-CM | POA: Diagnosis not present

## 2022-01-18 DIAGNOSIS — L97219 Non-pressure chronic ulcer of right calf with unspecified severity: Secondary | ICD-10-CM | POA: Diagnosis not present

## 2022-01-18 DIAGNOSIS — Z720 Tobacco use: Secondary | ICD-10-CM | POA: Diagnosis not present

## 2022-01-18 DIAGNOSIS — E785 Hyperlipidemia, unspecified: Secondary | ICD-10-CM | POA: Diagnosis not present

## 2022-01-18 DIAGNOSIS — E871 Hypo-osmolality and hyponatremia: Secondary | ICD-10-CM | POA: Diagnosis not present

## 2022-01-18 DIAGNOSIS — I110 Hypertensive heart disease with heart failure: Secondary | ICD-10-CM | POA: Diagnosis not present

## 2022-01-18 DIAGNOSIS — G4733 Obstructive sleep apnea (adult) (pediatric): Secondary | ICD-10-CM | POA: Diagnosis not present

## 2022-01-18 DIAGNOSIS — R509 Fever, unspecified: Secondary | ICD-10-CM | POA: Diagnosis not present

## 2022-01-18 DIAGNOSIS — I10 Essential (primary) hypertension: Secondary | ICD-10-CM | POA: Diagnosis not present

## 2022-01-18 DIAGNOSIS — Z7901 Long term (current) use of anticoagulants: Secondary | ICD-10-CM | POA: Diagnosis not present

## 2022-01-18 DIAGNOSIS — I739 Peripheral vascular disease, unspecified: Secondary | ICD-10-CM | POA: Diagnosis not present

## 2022-01-18 DIAGNOSIS — Z8616 Personal history of COVID-19: Secondary | ICD-10-CM | POA: Diagnosis not present

## 2022-01-18 DIAGNOSIS — R918 Other nonspecific abnormal finding of lung field: Secondary | ICD-10-CM | POA: Diagnosis not present

## 2022-01-18 DIAGNOSIS — B370 Candidal stomatitis: Secondary | ICD-10-CM | POA: Diagnosis not present

## 2022-01-18 DIAGNOSIS — F17210 Nicotine dependence, cigarettes, uncomplicated: Secondary | ICD-10-CM | POA: Diagnosis not present

## 2022-01-18 DIAGNOSIS — R062 Wheezing: Secondary | ICD-10-CM | POA: Diagnosis not present

## 2022-01-18 DIAGNOSIS — J189 Pneumonia, unspecified organism: Secondary | ICD-10-CM | POA: Diagnosis not present

## 2022-01-18 DIAGNOSIS — I509 Heart failure, unspecified: Secondary | ICD-10-CM | POA: Diagnosis not present

## 2022-01-18 DIAGNOSIS — D509 Iron deficiency anemia, unspecified: Secondary | ICD-10-CM | POA: Diagnosis not present

## 2022-01-18 DIAGNOSIS — Z9981 Dependence on supplemental oxygen: Secondary | ICD-10-CM | POA: Diagnosis not present

## 2022-01-18 DIAGNOSIS — Z20822 Contact with and (suspected) exposure to covid-19: Secondary | ICD-10-CM | POA: Diagnosis not present

## 2022-01-18 DIAGNOSIS — R1310 Dysphagia, unspecified: Secondary | ICD-10-CM | POA: Diagnosis not present

## 2022-01-18 DIAGNOSIS — E8729 Other acidosis: Secondary | ICD-10-CM | POA: Diagnosis not present

## 2022-01-18 DIAGNOSIS — I4819 Other persistent atrial fibrillation: Secondary | ICD-10-CM | POA: Diagnosis not present

## 2022-01-18 DIAGNOSIS — J8410 Pulmonary fibrosis, unspecified: Secondary | ICD-10-CM | POA: Diagnosis not present

## 2022-01-18 DIAGNOSIS — B3781 Candidal esophagitis: Secondary | ICD-10-CM | POA: Diagnosis not present

## 2022-01-18 DIAGNOSIS — I4891 Unspecified atrial fibrillation: Secondary | ICD-10-CM | POA: Diagnosis not present

## 2022-01-18 DIAGNOSIS — I2510 Atherosclerotic heart disease of native coronary artery without angina pectoris: Secondary | ICD-10-CM | POA: Diagnosis not present

## 2022-01-18 DIAGNOSIS — R0602 Shortness of breath: Secondary | ICD-10-CM | POA: Diagnosis not present

## 2022-01-18 DIAGNOSIS — J441 Chronic obstructive pulmonary disease with (acute) exacerbation: Secondary | ICD-10-CM | POA: Diagnosis not present

## 2022-01-18 DIAGNOSIS — D638 Anemia in other chronic diseases classified elsewhere: Secondary | ICD-10-CM | POA: Diagnosis not present

## 2022-01-18 DIAGNOSIS — I081 Rheumatic disorders of both mitral and tricuspid valves: Secondary | ICD-10-CM | POA: Diagnosis not present

## 2022-01-18 DIAGNOSIS — E7800 Pure hypercholesterolemia, unspecified: Secondary | ICD-10-CM | POA: Diagnosis not present

## 2022-01-18 DIAGNOSIS — R0902 Hypoxemia: Secondary | ICD-10-CM | POA: Diagnosis not present

## 2022-01-18 DIAGNOSIS — J9621 Acute and chronic respiratory failure with hypoxia: Secondary | ICD-10-CM | POA: Diagnosis not present

## 2022-01-21 DIAGNOSIS — I10 Essential (primary) hypertension: Secondary | ICD-10-CM | POA: Diagnosis not present

## 2022-01-21 DIAGNOSIS — E785 Hyperlipidemia, unspecified: Secondary | ICD-10-CM | POA: Diagnosis not present

## 2022-04-09 ENCOUNTER — Other Ambulatory Visit: Payer: Self-pay | Admitting: Allergy and Immunology

## 2022-04-14 ENCOUNTER — Other Ambulatory Visit: Payer: Self-pay | Admitting: Allergy and Immunology

## 2022-10-25 ENCOUNTER — Other Ambulatory Visit: Payer: Self-pay | Admitting: Allergy and Immunology

## 2023-01-17 ENCOUNTER — Telehealth: Payer: Self-pay

## 2023-01-17 NOTE — Patient Outreach (Signed)
  Care Coordination   Initial Visit Note   01/17/2023 Name: [name] MRN: [id number] DOB: 1941/08/17  [name] is a 82 y.o. year old female who sees Myrlene Broker, MD for primary care. I spoke with  Mitchell Heir by phone today.  What matters to the patients health and wellness today?   Placed call to patient to review [facility] program.  Patient reports that she is no longer going to [facility]. Reports she is going to [facility]. Epic updated.    SDOH assessments and interventions completed:  No     Care Coordination Interventions:  No, not indicated   Follow up plan: No further intervention required.   Encounter Outcome:  Pt. Visit Completed   Tomasa Rand, RN, BSN, CEN [place] Coordinator [phone]

## 2023-03-15 ENCOUNTER — Telehealth: Payer: Self-pay

## 2023-03-15 NOTE — Telephone Encounter (Signed)
Received fax from [facility] - DOB verified - requesting most recent office notes for the continued need of oxygen.  Faxed back form advising patient is under the care of:  Dr. Marisue Brooklyn, MD [facility] [address] [place], [place] [address] Ofc: [phone] Fax: [phone]

## 2023-06-02 ENCOUNTER — Other Ambulatory Visit: Payer: Self-pay

## 2023-06-02 ENCOUNTER — Emergency Department (HOSPITAL_COMMUNITY): Payer: No Typology Code available for payment source

## 2023-06-02 ENCOUNTER — Encounter (HOSPITAL_COMMUNITY): Payer: Self-pay

## 2023-06-02 ENCOUNTER — Inpatient Hospital Stay (HOSPITAL_COMMUNITY)
Admission: EM | Admit: 2023-06-02 | Discharge: 2023-06-25 | DRG: 190 | Disposition: E | Payer: No Typology Code available for payment source | Attending: Internal Medicine | Admitting: Internal Medicine

## 2023-06-02 DIAGNOSIS — Z1152 Encounter for screening for COVID-19: Secondary | ICD-10-CM

## 2023-06-02 DIAGNOSIS — Z515 Encounter for palliative care: Secondary | ICD-10-CM | POA: Diagnosis not present

## 2023-06-02 DIAGNOSIS — I2510 Atherosclerotic heart disease of native coronary artery without angina pectoris: Secondary | ICD-10-CM | POA: Diagnosis present

## 2023-06-02 DIAGNOSIS — K5900 Constipation, unspecified: Secondary | ICD-10-CM | POA: Diagnosis not present

## 2023-06-02 DIAGNOSIS — Z79899 Other long term (current) drug therapy: Secondary | ICD-10-CM

## 2023-06-02 DIAGNOSIS — B952 Enterococcus as the cause of diseases classified elsewhere: Secondary | ICD-10-CM

## 2023-06-02 DIAGNOSIS — Z7951 Long term (current) use of inhaled steroids: Secondary | ICD-10-CM

## 2023-06-02 DIAGNOSIS — J439 Emphysema, unspecified: Secondary | ICD-10-CM | POA: Diagnosis present

## 2023-06-02 DIAGNOSIS — Z825 Family history of asthma and other chronic lower respiratory diseases: Secondary | ICD-10-CM

## 2023-06-02 DIAGNOSIS — E876 Hypokalemia: Secondary | ICD-10-CM | POA: Diagnosis present

## 2023-06-02 DIAGNOSIS — R7881 Bacteremia: Secondary | ICD-10-CM | POA: Diagnosis present

## 2023-06-02 DIAGNOSIS — H9190 Unspecified hearing loss, unspecified ear: Secondary | ICD-10-CM | POA: Diagnosis present

## 2023-06-02 DIAGNOSIS — G8929 Other chronic pain: Secondary | ICD-10-CM | POA: Diagnosis present

## 2023-06-02 DIAGNOSIS — R1032 Left lower quadrant pain: Secondary | ICD-10-CM | POA: Diagnosis not present

## 2023-06-02 DIAGNOSIS — Z66 Do not resuscitate: Secondary | ICD-10-CM | POA: Diagnosis present

## 2023-06-02 DIAGNOSIS — I10 Essential (primary) hypertension: Secondary | ICD-10-CM | POA: Diagnosis present

## 2023-06-02 DIAGNOSIS — I160 Hypertensive urgency: Secondary | ICD-10-CM | POA: Diagnosis not present

## 2023-06-02 DIAGNOSIS — F419 Anxiety disorder, unspecified: Secondary | ICD-10-CM | POA: Diagnosis present

## 2023-06-02 DIAGNOSIS — F17210 Nicotine dependence, cigarettes, uncomplicated: Secondary | ICD-10-CM | POA: Diagnosis present

## 2023-06-02 DIAGNOSIS — M7989 Other specified soft tissue disorders: Secondary | ICD-10-CM | POA: Diagnosis not present

## 2023-06-02 DIAGNOSIS — G4700 Insomnia, unspecified: Secondary | ICD-10-CM | POA: Diagnosis not present

## 2023-06-02 DIAGNOSIS — T827XXD Infection and inflammatory reaction due to other cardiac and vascular devices, implants and grafts, subsequent encounter: Secondary | ICD-10-CM | POA: Diagnosis not present

## 2023-06-02 DIAGNOSIS — E7800 Pure hypercholesterolemia, unspecified: Secondary | ICD-10-CM | POA: Diagnosis present

## 2023-06-02 DIAGNOSIS — Z90710 Acquired absence of both cervix and uterus: Secondary | ICD-10-CM

## 2023-06-02 DIAGNOSIS — M4856XA Collapsed vertebra, not elsewhere classified, lumbar region, initial encounter for fracture: Secondary | ICD-10-CM | POA: Diagnosis present

## 2023-06-02 DIAGNOSIS — R54 Age-related physical debility: Secondary | ICD-10-CM | POA: Diagnosis present

## 2023-06-02 DIAGNOSIS — I081 Rheumatic disorders of both mitral and tricuspid valves: Secondary | ICD-10-CM | POA: Diagnosis present

## 2023-06-02 DIAGNOSIS — K219 Gastro-esophageal reflux disease without esophagitis: Secondary | ICD-10-CM | POA: Diagnosis present

## 2023-06-02 DIAGNOSIS — G4730 Sleep apnea, unspecified: Secondary | ICD-10-CM | POA: Diagnosis present

## 2023-06-02 DIAGNOSIS — I340 Nonrheumatic mitral (valve) insufficiency: Secondary | ICD-10-CM | POA: Diagnosis not present

## 2023-06-02 DIAGNOSIS — J9601 Acute respiratory failure with hypoxia: Secondary | ICD-10-CM | POA: Diagnosis not present

## 2023-06-02 DIAGNOSIS — E871 Hypo-osmolality and hyponatremia: Secondary | ICD-10-CM | POA: Diagnosis present

## 2023-06-02 DIAGNOSIS — Z885 Allergy status to narcotic agent status: Secondary | ICD-10-CM

## 2023-06-02 DIAGNOSIS — Z9981 Dependence on supplemental oxygen: Secondary | ICD-10-CM

## 2023-06-02 DIAGNOSIS — M810 Age-related osteoporosis without current pathological fracture: Secondary | ICD-10-CM | POA: Diagnosis present

## 2023-06-02 DIAGNOSIS — D649 Anemia, unspecified: Secondary | ICD-10-CM

## 2023-06-02 DIAGNOSIS — I700 Atherosclerosis of aorta: Secondary | ICD-10-CM | POA: Diagnosis present

## 2023-06-02 DIAGNOSIS — G4710 Hypersomnia, unspecified: Secondary | ICD-10-CM | POA: Diagnosis present

## 2023-06-02 DIAGNOSIS — Z950 Presence of cardiac pacemaker: Secondary | ICD-10-CM

## 2023-06-02 DIAGNOSIS — I4821 Permanent atrial fibrillation: Secondary | ICD-10-CM | POA: Diagnosis present

## 2023-06-02 DIAGNOSIS — M4854XA Collapsed vertebra, not elsewhere classified, thoracic region, initial encounter for fracture: Secondary | ICD-10-CM | POA: Diagnosis present

## 2023-06-02 DIAGNOSIS — J9819 Other pulmonary collapse: Secondary | ICD-10-CM | POA: Diagnosis not present

## 2023-06-02 DIAGNOSIS — Z888 Allergy status to other drugs, medicaments and biological substances status: Secondary | ICD-10-CM

## 2023-06-02 DIAGNOSIS — G40409 Other generalized epilepsy and epileptic syndromes, not intractable, without status epilepticus: Secondary | ICD-10-CM | POA: Diagnosis present

## 2023-06-02 DIAGNOSIS — J9621 Acute and chronic respiratory failure with hypoxia: Secondary | ICD-10-CM | POA: Diagnosis present

## 2023-06-02 DIAGNOSIS — Z7901 Long term (current) use of anticoagulants: Secondary | ICD-10-CM

## 2023-06-02 DIAGNOSIS — D509 Iron deficiency anemia, unspecified: Secondary | ICD-10-CM | POA: Diagnosis present

## 2023-06-02 DIAGNOSIS — T827XXA Infection and inflammatory reaction due to other cardiac and vascular devices, implants and grafts, initial encounter: Secondary | ICD-10-CM

## 2023-06-02 DIAGNOSIS — Z91041 Radiographic dye allergy status: Secondary | ICD-10-CM

## 2023-06-02 DIAGNOSIS — I480 Paroxysmal atrial fibrillation: Secondary | ICD-10-CM | POA: Diagnosis not present

## 2023-06-02 DIAGNOSIS — Z882 Allergy status to sulfonamides status: Secondary | ICD-10-CM

## 2023-06-02 DIAGNOSIS — J441 Chronic obstructive pulmonary disease with (acute) exacerbation: Principal | ICD-10-CM

## 2023-06-02 DIAGNOSIS — Z7952 Long term (current) use of systemic steroids: Secondary | ICD-10-CM

## 2023-06-02 DIAGNOSIS — I161 Hypertensive emergency: Secondary | ICD-10-CM | POA: Diagnosis not present

## 2023-06-02 DIAGNOSIS — Z7983 Long term (current) use of bisphosphonates: Secondary | ICD-10-CM

## 2023-06-02 DIAGNOSIS — Z83438 Family history of other disorder of lipoprotein metabolism and other lipidemia: Secondary | ICD-10-CM

## 2023-06-02 DIAGNOSIS — Z8249 Family history of ischemic heart disease and other diseases of the circulatory system: Secondary | ICD-10-CM

## 2023-06-02 DIAGNOSIS — E860 Dehydration: Secondary | ICD-10-CM | POA: Diagnosis present

## 2023-06-02 DIAGNOSIS — I110 Hypertensive heart disease with heart failure: Secondary | ICD-10-CM | POA: Diagnosis not present

## 2023-06-02 DIAGNOSIS — L989 Disorder of the skin and subcutaneous tissue, unspecified: Secondary | ICD-10-CM

## 2023-06-02 DIAGNOSIS — Z881 Allergy status to other antibiotic agents status: Secondary | ICD-10-CM

## 2023-06-02 DIAGNOSIS — I495 Sick sinus syndrome: Secondary | ICD-10-CM | POA: Diagnosis present

## 2023-06-02 DIAGNOSIS — Z974 Presence of external hearing-aid: Secondary | ICD-10-CM

## 2023-06-02 DIAGNOSIS — I509 Heart failure, unspecified: Secondary | ICD-10-CM | POA: Diagnosis not present

## 2023-06-02 DIAGNOSIS — Z85828 Personal history of other malignant neoplasm of skin: Secondary | ICD-10-CM

## 2023-06-02 DIAGNOSIS — Z7189 Other specified counseling: Secondary | ICD-10-CM | POA: Diagnosis not present

## 2023-06-02 DIAGNOSIS — Z7982 Long term (current) use of aspirin: Secondary | ICD-10-CM

## 2023-06-02 DIAGNOSIS — I4891 Unspecified atrial fibrillation: Secondary | ICD-10-CM | POA: Diagnosis present

## 2023-06-02 LAB — COMPREHENSIVE METABOLIC PANEL
ALT: 16 U/L (ref 0–44)
AST: 23 U/L (ref 15–41)
Albumin: 3.6 g/dL (ref 3.5–5.0)
Alkaline Phosphatase: 56 U/L (ref 38–126)
Anion gap: 15 (ref 5–15)
BUN: 12 mg/dL (ref 8–23)
CO2: 29 mmol/L (ref 22–32)
Calcium: 9.2 mg/dL (ref 8.9–10.3)
Chloride: 86 mmol/L — ABNORMAL LOW (ref 98–111)
Creatinine, Ser: 1.08 mg/dL — ABNORMAL HIGH (ref 0.44–1.00)
GFR, Estimated: 52 mL/min — ABNORMAL LOW (ref >60)
Glucose, Bld: 111 mg/dL — ABNORMAL HIGH (ref 70–99)
Potassium: 2.9 mmol/L — ABNORMAL LOW (ref 3.5–5.1)
Sodium: 130 mmol/L — ABNORMAL LOW (ref 135–145)
Total Bilirubin: 0.9 mg/dL (ref 0.3–1.2)
Total Protein: 6.7 g/dL (ref 6.5–8.1)

## 2023-06-02 LAB — URINALYSIS, ROUTINE W REFLEX MICROSCOPIC
Bilirubin Urine: NEGATIVE
Glucose, UA: NEGATIVE mg/dL
Hgb urine dipstick: NEGATIVE
Ketones, ur: NEGATIVE mg/dL
Leukocytes,Ua: NEGATIVE
Nitrite: NEGATIVE
Protein, ur: 30 mg/dL — AB
Specific Gravity, Urine: 1.005 (ref 1.005–1.030)
pH: 7 (ref 5.0–8.0)

## 2023-06-02 LAB — CBC
HCT: 36.2 % (ref 36.0–46.0)
Hemoglobin: 11.3 g/dL — ABNORMAL LOW (ref 12.0–15.0)
MCH: 24.7 pg — ABNORMAL LOW (ref 26.0–34.0)
MCHC: 31.2 g/dL (ref 30.0–36.0)
MCV: 79 fL — ABNORMAL LOW (ref 80.0–100.0)
Platelets: 411 10*3/uL — ABNORMAL HIGH (ref 150–400)
RBC: 4.58 MIL/uL (ref 3.87–5.11)
RDW: 22 % — ABNORMAL HIGH (ref 11.5–15.5)
WBC: 13.9 10*3/uL — ABNORMAL HIGH (ref 4.0–10.5)
nRBC: 0 % (ref 0.0–0.2)

## 2023-06-02 LAB — TROPONIN I (HIGH SENSITIVITY)
Troponin I (High Sensitivity): 18 ng/L — ABNORMAL HIGH (ref <18)
Troponin I (High Sensitivity): 17 ng/L (ref <18)

## 2023-06-02 LAB — LIPASE, BLOOD: Lipase: 27 U/L (ref 11–51)

## 2023-06-02 LAB — BRAIN NATRIURETIC PEPTIDE: B Natriuretic Peptide: 334 pg/mL — ABNORMAL HIGH (ref 0.0–100.0)

## 2023-06-02 MED ORDER — APIXABAN 2.5 MG PO TABS
2.5000 mg | ORAL_TABLET | Freq: Two times a day (BID) | ORAL | Status: DC
  Administered 2023-06-03 – 2023-06-16 (×29): 2.5 mg via ORAL
  Filled 2023-06-02 (×30): qty 1

## 2023-06-02 MED ORDER — POTASSIUM CHLORIDE CRYS ER 20 MEQ PO TBCR
60.0000 meq | EXTENDED_RELEASE_TABLET | Freq: Once | ORAL | Status: AC
  Administered 2023-06-02: 60 meq via ORAL
  Filled 2023-06-02: qty 3

## 2023-06-02 MED ORDER — FUROSEMIDE 40 MG PO TABS
40.0000 mg | ORAL_TABLET | Freq: Every day | ORAL | Status: DC
  Administered 2023-06-03 – 2023-06-09 (×7): 40 mg via ORAL
  Filled 2023-06-02 (×7): qty 1

## 2023-06-02 MED ORDER — ASPIRIN 81 MG PO TBEC
81.0000 mg | DELAYED_RELEASE_TABLET | Freq: Every day | ORAL | Status: DC
  Administered 2023-06-03 – 2023-06-16 (×14): 81 mg via ORAL
  Filled 2023-06-02 (×16): qty 1

## 2023-06-02 MED ORDER — FLUTICASONE FUROATE-VILANTEROL 200-25 MCG/ACT IN AEPB
1.0000 | INHALATION_SPRAY | Freq: Every day | RESPIRATORY_TRACT | Status: DC
  Administered 2023-06-03 – 2023-06-05 (×3): 1 via RESPIRATORY_TRACT
  Filled 2023-06-02: qty 28

## 2023-06-02 MED ORDER — DILTIAZEM HCL 25 MG/5ML IV SOLN
10.0000 mg | Freq: Once | INTRAVENOUS | Status: AC
  Administered 2023-06-02: 10 mg via INTRAVENOUS
  Filled 2023-06-02: qty 5

## 2023-06-02 MED ORDER — DILTIAZEM HCL ER COATED BEADS 120 MG PO CP24
120.0000 mg | ORAL_CAPSULE | Freq: Once | ORAL | Status: AC
  Administered 2023-06-02: 120 mg via ORAL
  Filled 2023-06-02: qty 1

## 2023-06-02 MED ORDER — ACETAMINOPHEN 650 MG RE SUPP: 650.0000 mg | Freq: Four times a day (QID) | RECTAL | Status: DC | PRN

## 2023-06-02 MED ORDER — ONDANSETRON 4 MG PO TBDP
4.0000 mg | ORAL_TABLET | Freq: Once | ORAL | Status: AC | PRN
  Administered 2023-06-02: 4 mg via ORAL
  Filled 2023-06-02: qty 1

## 2023-06-02 MED ORDER — POLYETHYLENE GLYCOL 3350 17 G PO PACK
17.0000 g | PACK | Freq: Every day | ORAL | Status: DC | PRN
  Administered 2023-06-03 – 2023-06-14 (×4): 17 g via ORAL
  Filled 2023-06-02 (×4): qty 1

## 2023-06-02 MED ORDER — DILTIAZEM HCL 25 MG/5ML IV SOLN: 10.0000 mg | Freq: Once | INTRAVENOUS | Status: DC

## 2023-06-02 MED ORDER — IPRATROPIUM-ALBUTEROL 0.5-2.5 (3) MG/3ML IN SOLN
3.0000 mL | Freq: Once | RESPIRATORY_TRACT | Status: AC
  Administered 2023-06-02: 3 mL via RESPIRATORY_TRACT
  Filled 2023-06-02: qty 3

## 2023-06-02 MED ORDER — MONTELUKAST SODIUM 10 MG PO TABS
10.0000 mg | ORAL_TABLET | Freq: Every day | ORAL | Status: DC
  Administered 2023-06-03 – 2023-06-16 (×15): 10 mg via ORAL
  Filled 2023-06-02 (×15): qty 1

## 2023-06-02 MED ORDER — SODIUM CHLORIDE 0.9 % IV SOLN
500.0000 mg | INTRAVENOUS | Status: DC
  Administered 2023-06-03: 500 mg via INTRAVENOUS
  Filled 2023-06-02 (×2): qty 5

## 2023-06-02 MED ORDER — SODIUM CHLORIDE 0.9% FLUSH
3.0000 mL | Freq: Two times a day (BID) | INTRAVENOUS | Status: DC
  Administered 2023-06-03 – 2023-06-17 (×22): 3 mL via INTRAVENOUS

## 2023-06-02 MED ORDER — UMECLIDINIUM BROMIDE 62.5 MCG/ACT IN AEPB
1.0000 | INHALATION_SPRAY | Freq: Every day | RESPIRATORY_TRACT | Status: DC
  Administered 2023-06-03 – 2023-06-05 (×3): 1 via RESPIRATORY_TRACT
  Filled 2023-06-02: qty 7

## 2023-06-02 MED ORDER — METHYLPREDNISOLONE SODIUM SUCC 125 MG IJ SOLR
125.0000 mg | INTRAMUSCULAR | Status: AC
  Administered 2023-06-02: 125 mg via INTRAVENOUS
  Filled 2023-06-02: qty 2

## 2023-06-02 MED ORDER — ALBUTEROL SULFATE (2.5 MG/3ML) 0.083% IN NEBU
2.5000 mg | INHALATION_SOLUTION | Freq: Four times a day (QID) | RESPIRATORY_TRACT | Status: DC | PRN
  Administered 2023-06-05 (×2): 2.5 mg via RESPIRATORY_TRACT
  Filled 2023-06-02 (×2): qty 3

## 2023-06-02 MED ORDER — DILTIAZEM HCL 90 MG PO TABS
100.0000 mg | ORAL_TABLET | Freq: Once | ORAL | Status: DC
Start: 2023-06-02 — End: 2023-06-02

## 2023-06-02 MED ORDER — HYDROXYZINE HCL 25 MG PO TABS
25.0000 mg | ORAL_TABLET | Freq: Three times a day (TID) | ORAL | Status: DC | PRN
  Administered 2023-06-03 – 2023-06-10 (×15): 25 mg via ORAL
  Filled 2023-06-02 (×15): qty 1

## 2023-06-02 MED ORDER — DILTIAZEM HCL 25 MG/5ML IV SOLN
15.0000 mg | Freq: Four times a day (QID) | INTRAVENOUS | Status: DC | PRN
  Administered 2023-06-03 – 2023-06-05 (×2): 15 mg via INTRAVENOUS
  Filled 2023-06-02 (×5): qty 5

## 2023-06-02 MED ORDER — ACETAMINOPHEN 325 MG PO TABS
650.0000 mg | ORAL_TABLET | Freq: Four times a day (QID) | ORAL | Status: DC | PRN
  Administered 2023-06-03 – 2023-06-16 (×19): 650 mg via ORAL
  Filled 2023-06-02 (×20): qty 2

## 2023-06-02 MED ORDER — DILTIAZEM HCL ER COATED BEADS 120 MG PO CP24
120.0000 mg | ORAL_CAPSULE | Freq: Every day | ORAL | Status: DC
  Administered 2023-06-03 – 2023-06-16 (×14): 120 mg via ORAL
  Filled 2023-06-02 (×15): qty 1

## 2023-06-02 MED ORDER — POTASSIUM CHLORIDE 10 MEQ/100ML IV SOLN
10.0000 meq | Freq: Once | INTRAVENOUS | Status: AC
  Administered 2023-06-02: 10 meq via INTRAVENOUS
  Filled 2023-06-02: qty 100

## 2023-06-02 MED ORDER — ALBUTEROL SULFATE (2.5 MG/3ML) 0.083% IN NEBU
10.0000 mg | INHALATION_SOLUTION | Freq: Once | RESPIRATORY_TRACT | Status: AC
  Administered 2023-06-02: 10 mg via RESPIRATORY_TRACT
  Filled 2023-06-02: qty 12

## 2023-06-02 MED ORDER — SODIUM CHLORIDE 0.9 % IV SOLN
1.0000 g | INTRAVENOUS | Status: DC
  Administered 2023-06-03: 1 g via INTRAVENOUS
  Filled 2023-06-02: qty 10

## 2023-06-02 MED ORDER — PANTOPRAZOLE SODIUM 40 MG PO TBEC
40.0000 mg | DELAYED_RELEASE_TABLET | Freq: Every day | ORAL | Status: DC
  Administered 2023-06-03 – 2023-06-16 (×14): 40 mg via ORAL
  Filled 2023-06-02 (×15): qty 1

## 2023-06-02 NOTE — ED Provider Notes (Signed)
I provided a substantive portion of the care of this patient.  I personally made/approved the management plan for this patient and take responsibility for the patient management.  EKG Interpretation Date/Time:  Friday June 02 2023 14:52:35 EDT Ventricular Rate:  83 PR Interval:    QRS Duration:  72 QT Interval:  368 QTC Calculation: 432 R Axis:   45  Text Interpretation: atrial flutter Septal infarct , age undetermined ST & T wave abnormality, consider inferolateral ischemia Abnormal ECG When compared with ECG of 20-Jul-2016 15:15, PREVIOUS ECG IS PRESENT   Confirmed by Arby Barrette [phone]) on 06/02/2023 6:41:56 PM   Has history of COPD and active smoking.  She reports she is cutting back.  Patient reports that over the past week she has had increasingly significant shortness of breath with exertion.  She reports minor activity now is making her very short of breath.  Patient reports she has had some productive cough but has some baseline chronic cough.  Blood pressures have become very elevated and she was seen pain management for vertebral compression fractures.  Blood pressures were identified to be 230s over 100.  Patient denies previously having had blood pressures as high.  She reports sometimes they are up to the 160s but after she got her pacemaker she was not recommended to be continued on any blood pressure medications.  Patient is alert.  Mild increased work of breathing at rest with some pursed lip exhaling.  Patient has combination of wheeze and crackle.  More crackles at the lung bases on the left.  Expiratory wheeze and occasional rhonchi with wet cough.  Abdomen is mildly distended but soft and nontender.  Patient has changes of chronic venous stasis but no significant swelling at this time.  Calves soft and nontender and symmetric.  At this time patient has had 2 breathing treatments and a continuous nebulizer therapy.  After assessment she continues to be visibly short of  breath and on oxygen 3 L.  At home she uses oxygen as needed typically not during the daytime but at night.  At this time with persisting wheeze, cough and shortness of breath, plan for admission for COPD exacerbation.  Patient also has hypertensive urgency.  At this time she does not have active chest pain and I have lower suspicion for ACS given stable troponin but will need of observation.  CRITICAL CARE Performed by: Arby Barrette   Total critical care time: 30 minutes  Critical care time was exclusive of separately billable procedures and treating other patients.  Critical care was necessary to treat or prevent imminent or life-threatening deterioration.  Critical care was time spent personally by me on the following activities: development of treatment plan with patient and/or surrogate as well as nursing, discussions with consultants, evaluation of patient's response to treatment, examination of patient, obtaining history from patient or surrogate, ordering and performing treatments and interventions, ordering and review of laboratory studies, ordering and review of radiographic studies, pulse oximetry and re-evaluation of patient's condition.    Arby Barrette, MD 06/02/23 2124    Arby Barrette, MD 06/06/23 1539

## 2023-06-02 NOTE — Assessment & Plan Note (Signed)
Reports that this is a chronic issue for which patient is treated with Lasix.  At this time slight elevation in creatinine is noted, however I do not suspect that the patient is dehydrated because of Lasix, I suspect the creatinine elevation is due to high blood pressure.  We will continue Lasix, monitor weight intake and output

## 2023-06-02 NOTE — Assessment & Plan Note (Signed)
Patient is s/p IV steroids as well as inhaled bronchodilator therapy.  At this time I will continue continue with inhaled bronchodilator therapy as well as inhaled steroids.  I will defer systemic steroids at this time to repeat physical exam in the morning.  I did hear some crackles in the left lung therefore my concern for pneumonia versus COPD exacerbation due to infection is high.  Therefore we will treat the patient with ceftriaxone and azithromycin.  Please note that the patient's allergy to Augmentin.  However patient has tolerated amoxicillin as well as cefdinir in the past.  Therefore I suspect her allergy is to clavulanic acid.

## 2023-06-02 NOTE — Assessment & Plan Note (Addendum)
Was having shortness of breath symptoms on initial evaluation and markedly elevated blood pressure.  Patient has had blood pressure improvement with diltiazem.  Given COPD, will avoid beta-blocker for now and continue with diltiazem as needed.  Patient was supposed to be on diltiazem in the past, I believe for rate control of atrial fibrillation.  However she had a pacemaker put in ?after ablatin and this was discontinued.  I am not sure if she had any indication of hypertension for diltiazem.  Patient is noted to be hypokalemic therefore I will check renin aldosterone, I will also check serum metanephrines  Patient describes rather frequent visits with various healthcare providers in the last several months.  Therefore I do not suspect that this is hypertension that has been going on for long periods of time and has been missed because patient was not having contact with the healthcare system.  It seems to me that this is recent in onset.  Patient denies any over-the-counter medications such as phenylephrine or similar agents that would explain presentation

## 2023-06-02 NOTE — ED Triage Notes (Signed)
Pt was referred to ED by her PCP for elevated B/P. Pt reports abd pain x 1 week with nausea. Pt was started on an abx on 8/5 for pleurisy.

## 2023-06-02 NOTE — ED Provider Notes (Signed)
EMERGENCY DEPARTMENT AT [facility] Provider Note   CSN: [id number] Arrival date & time: 06/02/23  1407     History  Chief Complaint  Patient presents with   Abdominal Pain   Hypertension    [name] is a 82 y.o. female with medical history of asthma, bronchitis, COPD wears oxygen as needed, CAD, CHF, GERD, oxygen desaturation during sleep, paroxysmal atrial tachycardia, seizures.  The patient presents to the ED for evaluation of elevated blood pressure, chest pain and shortness of breath.  The patient reports that this morning she was set to see her pain management specialist in [facility] for imaging.  She reports that she was set to have pain injection given in clinic however she arrived to the pain clinic and the doctor noted that her blood pressure was elevated.  The patient denies a history of elevated blood pressure.  Patient reports that recently she was seen in the hospital her blood pressure was 150 systolic, she denies ever taking blood pressure medication.  She reports that she has had chest pain for the last 1 month associated with shortness of breath of the last 1 week.  She states that she typically has shortness of breath and wears oxygen as needed, typically at nights, but has been wearing it more so recently during the day.  She states that she also has chest pain which is nonradiating and worse with exertion.  She denies any leg swelling, nausea, vomiting, diarrhea, fevers, abdominal pain.  She denies headache, blurred vision.   Abdominal Pain Associated symptoms: chest pain, cough, nausea and shortness of breath   Associated symptoms: no diarrhea, no fever and no vomiting   Hypertension Associated symptoms include chest pain, abdominal pain and shortness of breath.       Home Medications Prior to Admission medications   Medication Sig Start Date End Date Taking? Authorizing Provider  acetaminophen (TYLENOL) 325 MG tablet Take 650 mg by  mouth every 6 (six) hours as needed.   Yes [provider]  albuterol (VENTOLIN HFA) 108 (90 Base) MCG/ACT inhaler 2 inhalations every 4-6 hrs 02/03/20  Yes Kozlow, Alvira Philips, MD  alendronate (FOSAMAX) 70 MG tablet Take 70 mg by mouth once a week. 07/23/20  Yes [provider]  aspirin 81 MG tablet Take 81 mg by mouth 2 (two) times daily.   Yes [provider]  ELIQUIS 2.5 MG TABS tablet Take 2.5 mg by mouth 2 (two) times daily. 07/21/22  Yes [provider]  ferrous sulfate 325 (65 FE) MG tablet Take 325 mg by mouth daily before breakfast. 05/13/23 06/12/23 Yes [provider]  Fluticasone-Umeclidin-Vilant (TRELEGY ELLIPTA) 200-62.5-25 MCG/INH AEPB Inhale one dose once daily to prevent cough or wheeze.  Rinse, gargle, and spit after use. 04/14/21  Yes Kozlow, Alvira Philips, MD  furosemide (LASIX) 40 MG tablet Take 40 mg by mouth daily. 03/17/21  Yes [provider]  hydrOXYzine (ATARAX) 25 MG tablet Take 25 mg by mouth 3 (three) times daily as needed for anxiety. 10/06/22  Yes [provider]  ipratropium-albuterol (DUONEB) 0.5-2.5 (3) MG/3ML SOLN Take by nebulization. 02/16/21  Yes [provider]  LORazepam (ATIVAN) 0.5 MG tablet Take 0.5 mg by mouth daily as needed for anxiety. 01/17/20  Yes [provider]  Magnesium 400 MG TABS Take by mouth in the morning and at bedtime.   Yes [provider]  montelukast (SINGULAIR) 10 MG tablet TAKE 1 TABLET BY MOUTH AT BEDTIME  10/04/21  Yes Kozlow, Alvira Philips, MD  pantoprazole (PROTONIX) 40 MG tablet Take 1 tablet (40 mg total) by mouth daily. 12/14/21  Yes Kozlow, Alvira Philips, MD  albuterol (PROVENTIL) (2.5 MG/3ML) 0.083% nebulizer solution Take 2.5 mg by nebulization every 6 (six) hours as needed for wheezing or shortness of breath.    [provider]  doxycycline (VIBRA-TABS) 100 MG tablet Take 100 mg by mouth 2 (two) times daily. Patient not taking: Reported on 06/02/2023 05/13/23    [provider]  ipratropium (ATROVENT) 0.02 % nebulizer solution Take 0.5 mg by nebulization 4 (four) times daily.    [provider]  nystatin cream (MYCOSTATIN) SMARTSIG:Vaginal 3-4 Times Daily Patient not taking: Reported on 04/14/2021 02/04/21   [provider]  predniSONE (STERAPRED UNI-PAK 48 TAB) 5 MG (48) TBPK tablet Take 5 mg by mouth as directed. Patient not taking: Reported on 06/02/2023 04/08/21   [provider]  Spacer/Aero-Holding Chambers (AEROCHAMBER PLUS) inhaler Use as directed with inhaler. 04/08/20   Kozlow, Alvira Philips, MD      Allergies    Augmentin [amoxicillin-pot clavulanate], Codeine, Pravastatin, Sulfa antibiotics, Contrast media [iodinated contrast media], and Metrizamide    Review of Systems   Review of Systems  Constitutional:  Negative for fever.  Respiratory:  Positive for cough, shortness of breath and wheezing.   Cardiovascular:  Positive for chest pain. Negative for leg swelling.  Gastrointestinal:  Positive for abdominal pain and nausea. Negative for blood in stool, diarrhea and vomiting.  All other systems reviewed and are negative.   Physical Exam Updated Vital Signs BP (!) 176/79   Pulse 79   Temp 98.7 F (37.1 C) (Oral)   Resp (!) 23   Ht 5' (1.524 m)   Wt 52.2 kg   SpO2 94%   BMI 22.46 kg/m  Physical Exam Vitals and nursing note reviewed.  Constitutional:      General: She is not in acute distress.    Appearance: She is ill-appearing. She is not toxic-appearing or diaphoretic.  HENT:     Head: Normocephalic and atraumatic.     Nose: Nose normal.     Mouth/Throat:     Mouth: Mucous membranes are moist.     Pharynx: Oropharynx is clear.  Eyes:     Extraocular Movements: Extraocular movements intact.     Conjunctiva/sclera: Conjunctivae normal.     Pupils: Pupils are equal, round, and reactive to light.  Cardiovascular:     Rate and Rhythm: Normal rate and regular rhythm.  Pulmonary:     Breath sounds:  Wheezing and rales present.  Abdominal:     General: Abdomen is flat. Bowel sounds are normal.     Palpations: Abdomen is soft.     Tenderness: There is no abdominal tenderness.  Musculoskeletal:     Cervical back: Normal range of motion and neck supple. No tenderness.     Right lower leg: No edema.     Left lower leg: No edema.  Skin:    General: Skin is warm and dry.     Capillary Refill: Capillary refill takes less than 2 seconds.  Neurological:     General: No focal deficit present.     Mental Status: She is alert and oriented to person, place, and time.     GCS: GCS eye subscore is 4. GCS verbal subscore is 5. GCS motor subscore is 6.     Cranial Nerves: Cranial nerves 2-12 are intact. No cranial nerve deficit.  Sensory: Sensation is intact. No sensory deficit.     Motor: Motor function is intact. No weakness.     Coordination: Coordination is intact. Heel to Shin Test normal.     ED Results / Procedures / Treatments   Labs (all labs ordered are listed, but only abnormal results are displayed) Labs Reviewed  COMPREHENSIVE METABOLIC PANEL - Abnormal; Notable for the following components:      Result Value   Sodium 130 (*)    Potassium 2.9 (*)    Chloride 86 (*)    Glucose, Bld 111 (*)    Creatinine, Ser 1.08 (*)    GFR, Estimated 52 (*)    All other components within normal limits  CBC - Abnormal; Notable for the following components:   WBC 13.9 (*)    Hemoglobin 11.3 (*)    MCV 79.0 (*)    MCH 24.7 (*)    RDW 22.0 (*)    Platelets 411 (*)    All other components within normal limits  URINALYSIS, ROUTINE W REFLEX MICROSCOPIC - Abnormal; Notable for the following components:   Color, Urine STRAW (*)    Protein, ur 30 (*)    Bacteria, UA RARE (*)    All other components within normal limits  BRAIN NATRIURETIC PEPTIDE - Abnormal; Notable for the following components:   B Natriuretic Peptide 334.0 (*)    All other components within normal limits  TROPONIN I  (HIGH SENSITIVITY) - Abnormal; Notable for the following components:   Troponin I (High Sensitivity) 18 (*)    All other components within normal limits  CULTURE, BLOOD (ROUTINE X 2)  CULTURE, BLOOD (ROUTINE X 2)  LIPASE, BLOOD  RAPID URINE DRUG SCREEN, HOSP PERFORMED  APTT  PROTIME-INR  BASIC METABOLIC PANEL  CBC  ALDOSTERONE + RENIN ACTIVITY W/ RATIO  METANEPHRINES, PLASMA  POTASSIUM  VITAMIN B12  FOLATE  IRON AND TIBC  FERRITIN  RETICULOCYTES  PROTEIN ELECTROPHORESIS, SERUM  TROPONIN I (HIGH SENSITIVITY)    EKG EKG Interpretation Date/Time:  Friday June 02 2023 14:52:35 EDT Ventricular Rate:  83 PR Interval:    QRS Duration:  72 QT Interval:  368 QTC Calculation: 432 R Axis:   45  Text Interpretation: atrial flutter Septal infarct , age undetermined ST & T wave abnormality, consider inferolateral ischemia Abnormal ECG When compared with ECG of 20-Jul-2016 15:15, PREVIOUS ECG IS PRESENT   Confirmed by Arby Barrette [phone]) on 06/02/2023 6:41:56 PM  Radiology DG Chest 2 View  Result Date: 06/02/2023 CLINICAL DATA:  Shortness of breath. EXAM: CHEST - 2 VIEW COMPARISON:  Radiographs 05/29/2023 and 05/13/2023.  CT 09/22/2021. FINDINGS: 1538 hours. Two views obtained. The heart size and mediastinal contours are stable with aortic atherosclerosis. Left subclavian pacemaker leads project over the right ventricular apex and appear unchanged. Stable chronic pleural thickening at the left costophrenic angle, likely related to old left-sided rib fractures. The lungs appear clear. No evidence of significant pleural effusion or pneumothorax. Multiple thoracolumbar compression deformities are grossly unchanged. IMPRESSION: No evidence of acute cardiopulmonary process. 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 Electronically Signed   By: Carey Bullocks M.D.   On: 06/02/2023 16:23    Procedures Procedures   Medications Ordered in ED Medications   albuterol (PROVENTIL) (2.5 MG/3ML) 0.083% nebulizer solution 2.5 mg (has no administration in time range)  aspirin EC tablet 81 mg (has no administration in time range)  apixaban (ELIQUIS) tablet 2.5 mg (has no administration in time range)  fluticasone  furoate-vilanterol (BREO ELLIPTA) 200-25 MCG/ACT 1 puff (has no administration in time range)    And  umeclidinium bromide (INCRUSE ELLIPTA) 62.5 MCG/ACT 1 puff (has no administration in time range)  furosemide (LASIX) tablet 40 mg (has no administration in time range)  hydrOXYzine (ATARAX) tablet 25 mg (has no administration in time range)  montelukast (SINGULAIR) tablet 10 mg (has no administration in time range)  pantoprazole (PROTONIX) EC tablet 40 mg (has no administration in time range)  acetaminophen (TYLENOL) tablet 650 mg (has no administration in time range)    Or  acetaminophen (TYLENOL) suppository 650 mg (has no administration in time range)  polyethylene glycol (MIRALAX / GLYCOLAX) packet 17 g (has no administration in time range)  sodium chloride flush (NS) 0.9 % injection 3 mL (has no administration in time range)  cefTRIAXone (ROCEPHIN) 1 g in sodium chloride 0.9 % 100 mL IVPB (has no administration in time range)  azithromycin (ZITHROMAX) 500 mg in sodium chloride 0.9 % 250 mL IVPB (has no administration in time range)  diltiazem (CARDIZEM) injection 15 mg (has no administration in time range)  diltiazem (CARDIZEM CD) 24 hr capsule 120 mg (has no administration in time range)  ondansetron (ZOFRAN-ODT) disintegrating tablet 4 mg (4 mg Oral Given 06/02/23 1516)  ipratropium-albuterol (DUONEB) 0.5-2.5 (3) MG/3ML nebulizer solution 3 mL (3 mLs Nebulization Given 06/02/23 1604)  methylPREDNISolone sodium succinate (SOLU-MEDROL) 125 mg/2 mL injection 125 mg (125 mg Intravenous Given 06/02/23 1535)  diltiazem (CARDIZEM CD) 24 hr capsule 120 mg (120 mg Oral Given 06/02/23 1748)  diltiazem (CARDIZEM) injection 10 mg (10 mg Intravenous Given  06/02/23 1609)  potassium chloride SA (KLOR-CON M) CR tablet 60 mEq (60 mEq Oral Given 06/02/23 1633)  potassium chloride 10 mEq in 100 mL IVPB (0 mEq Intravenous Stopped 06/02/23 1824)  ipratropium-albuterol (DUONEB) 0.5-2.5 (3) MG/3ML nebulizer solution 3 mL (3 mLs Nebulization Given 06/02/23 1701)  albuterol (PROVENTIL) (2.5 MG/3ML) 0.083% nebulizer solution 10 mg (10 mg Nebulization Given 06/02/23 1854)    ED Course/ Medical Decision Making/ A&P Clinical Course as of 06/02/23 2339  Fri Jun 02, 2023  2120 COPD exacerbation admit, hypertensive urgency. Came in with chest pain, chest pain has decreased however pressures trending back up.  [CG]    Clinical Course User Index [CG] Al Decant, PA-C    Medical Decision Making Amount and/or Complexity of Data Reviewed Labs: ordered.  Risk Prescription drug management.   82 year old female presents to ED for evaluation.  Please see HPI for further details.  On examination the patient is afebrile, nontachycardic.  Lung sounds have wheezing, rhonchi and rales throughout, she is currently on 2 L of oxygen.  Abdomen soft and compressible throughout.  Neurological examination at baseline.  CBC shows leukocytosis of 13.9, hemoglobin 11.3 which is baseline.  Metabolic panel shows sodium 130, potassium 2.9, creatinine 1.08.  Patient creatinine on 7/20 was 0.99.  Patient was given 60 mEq oral potassium as well as 10 IV potassium.  Patient lipase WNL.  Patient troponin 18, 17 with delta.  The patient chart was reviewed and it appears that her baseline troponin is around 18 through Care Everywhere.  The patient BNP is also elevated to 334 however again through chart review patient seems to have baseline BNP above 400.  Urinalysis unremarkable.  Patient chest x-ray unremarkable.  Patient was given 2 duo nebulizers and reassessed, patient has continued rhonchi and wheezing.  Continuous duo nebulizer administered at this time and on reassessment patient  continues to  wheeze.  Patient was also given 125 Solu-Medrol.  In terms of blood pressure control the patient was given 10 mg of IV Cardizem, 120 mg of oral Cardizem.  Patient blood pressure did improve and is currently 176/79 however patient appears to still be working to breathe, her blood pressure is not ideal.  Due to this, we will admit the patient for COPD exacerbation as well as hypertensive urgency.  Discussed patient case with Dr. Maryjean Ka, [facility] hospitalist who is agreed to admit the patient.  Patient stable at admission.   Final Clinical Impression(s) / ED Diagnoses Final diagnoses:  Hypertensive urgency  COPD exacerbation [facility])    Rx / DC Orders ED Discharge Orders     None         Al Decant, PA-C 06/02/23 2340    Arby Barrette, MD 06/06/23 1535

## 2023-06-02 NOTE — ED Notes (Signed)
ED TO INPATIENT HANDOFF REPORT  ED Nurse Name and Phone #: Scheryl Marten RN, [phone]  S Name/Age/Gender [name] 82 y.o. female Room/Bed: 045C/045C  Code Status   Code Status: Full Code  Home/SNF/Other Home Patient oriented to: self, place, time, and situation Is this baseline? Yes   Triage Complete: Triage complete  Chief Complaint A-fib [facility]) [I48.91]  Triage Note Pt was referred to ED by her PCP for elevated B/P. Pt reports abd pain x 1 week with nausea. Pt was started on an abx on 8/5 for pleurisy.    Allergies Allergies  Allergen Reactions   Augmentin [Amoxicillin-Pot Clavulanate] Shortness Of Breath and Dermatitis    Patient has tolerated amoxicillin as well as cefdinir in the past.  Several times.  Husband/patient feels that her allergy is to the other agent and Augmentin, namely clavulanic acid   Codeine Shortness Of Breath   Pravastatin Other (See Comments)    Give muscle aches and weakness    Sulfa Antibiotics    Contrast Media [Iodinated Contrast Media] Rash    Itching and rash   Metrizamide Rash    Itching and rash    Level of Care/Admitting Diagnosis ED Disposition     ED Disposition  Admit   Condition  --   Comment  Hospital Area: [facility] [[id number]]  Level of Care: Telemetry Medical [104]  May admit patient to [facility] or [facility] if equivalent level of care is available:: No  Covid Evaluation: Asymptomatic - no recent exposure (last 10 days) testing not required  Diagnosis: A-fib [facility]) [[id number]]  Admitting Physician: Nolberto Hanlon [[id number]]  Attending Physician: Nolberto Hanlon [[id number]]  Certification:: I certify this patient will need inpatient services for at least 2 midnights  Estimated Length of Stay: 3          B Medical/Surgery History Past Medical History:  Diagnosis Date   Allergy    Anxiety    Arthritis    Asthma    Bronchitis    CAD (coronary artery disease)    Cancer (HCC)    skin cancer  right arm   Carotid artery occlusion    Cataracts, bilateral    CHF (congestive heart failure) (HCC)    Chronic airway obstruction, not elsewhere classified    COPD (chronic obstructive pulmonary disease) (HCC)    Dysphagia, unspecified(787.20)    Generalized convulsive epilepsy without mention of intractable epilepsy    GERD (gastroesophageal reflux disease)    Headache    Hearing impaired    Hyperlipidemia    Hypersomnia with sleep apnea, unspecified    Osteoporosis    Oxygen desaturation during sleep    PRN;02 liters   Paroxysmal atrial tachycardia    PONV (postoperative nausea and vomiting)    " I had problems where my heart starts speeding and fluttering; it goes away by itself ."   Pure hypercholesterolemia    Reflux    Rheumatic fever    as a child   Seizures (HCC)    Shortness of breath 03/11/2010   while laying still   Wears glasses    Wears hearing aids    Past Surgical History:  Procedure Laterality Date   ABDOMINAL HYSTERECTOMY     ABLATION     x 3   CAROTID ENDARTERECTOMY  11/11/2010   left with Dacron patch angioplasty   CESAREAN SECTION     2 c-sections   ENDARTERECTOMY Right 07/19/2016   Procedure: ENDARTERECTOMY RIGHT CAROTID ARTERY;  Surgeon:  Chuck Hint, MD;  Location: [facility] OR;  Service: Vascular;  Laterality: Right;   HERNIA REPAIR     PATCH ANGIOPLASTY Right 07/19/2016   Procedure: PATCH ANGIOPLASTY RIGHT CAROTID ARTERY USING HEMASHIELD PLATINUM FINESSE PATCH;  Surgeon: Chuck Hint, MD;  Location: [facility] OR;  Service: Vascular;  Laterality: Right;   SKIN CANCER EXCISION     TONSILLECTOMY AND ADENOIDECTOMY       A IV Location/Drains/Wounds Patient Lines/Drains/Airways Status     Active Line/Drains/Airways     Name Placement date Placement time Site Days   Peripheral IV 06/02/23 20 G Right Antecubital 06/02/23  1535  Antecubital  less than 1   Incision (Closed) 07/19/16 Neck Right 07/19/16  1008  -- 2509             Intake/Output Last 24 hours No intake or output data in the 24 hours ending 06/02/23 2323  Labs/Imaging Results for orders placed or performed during the hospital encounter of 06/02/23 (from the past 48 hour(s))  Lipase, blood     Status: None   Collection Time: 06/02/23  2:56 PM  Result Value Ref Range   Lipase 27 11 - 51 U/L    Comment: Performed at [facility], [address], [place], [place] [address]  Comprehensive metabolic panel     Status: Abnormal   Collection Time: 06/02/23  2:56 PM  Result Value Ref Range   Sodium 130 (L) 135 - 145 mmol/L   Potassium 2.9 (L) 3.5 - 5.1 mmol/L   Chloride 86 (L) 98 - 111 mmol/L   CO2 29 22 - 32 mmol/L   Glucose, Bld 111 (H) 70 - 99 mg/dL    Comment: Glucose reference range applies only to samples taken after fasting for at least 8 hours.   BUN 12 8 - 23 mg/dL   Creatinine, Ser 6.04 (H) 0.44 - 1.00 mg/dL   Calcium 9.2 8.9 - 54.0 mg/dL   Total Protein 6.7 6.5 - 8.1 g/dL   Albumin 3.6 3.5 - 5.0 g/dL   AST 23 15 - 41 U/L   ALT 16 0 - 44 U/L   Alkaline Phosphatase 56 38 - 126 U/L   Total Bilirubin 0.9 0.3 - 1.2 mg/dL   GFR, Estimated 52 (L) >60 mL/min    Comment: (NOTE) Calculated using the CKD-EPI Creatinine Equation (2021)    Anion gap 15 5 - 15    Comment: Performed at [facility], [address], [place], [place] [address]  CBC     Status: Abnormal   Collection Time: 06/02/23  2:56 PM  Result Value Ref Range   WBC 13.9 (H) 4.0 - 10.5 K/uL   RBC 4.58 3.87 - 5.11 MIL/uL   Hemoglobin 11.3 (L) 12.0 - 15.0 g/dL   HCT 14.7 82.9 - 56.2 %   MCV 79.0 (L) 80.0 - 100.0 fL   MCH 24.7 (L) 26.0 - 34.0 pg   MCHC 31.2 30.0 - 36.0 g/dL   RDW 13.0 (H) 86.5 - 78.4 %   Platelets 411 (H) 150 - 400 K/uL   nRBC 0.0 0.0 - 0.2 %    Comment: Performed at [facility], [address], [place], [place] [address]  Urinalysis, Routine w reflex microscopic -Urine, Clean Catch     Status: Abnormal   Collection Time: 06/02/23  4:37  PM  Result Value Ref Range   Color, Urine STRAW (A) YELLOW   APPearance CLEAR CLEAR   Specific Gravity,  Urine 1.005 1.005 - 1.030   pH 7.0 5.0 - 8.0   Glucose, UA NEGATIVE NEGATIVE mg/dL   Hgb urine dipstick NEGATIVE NEGATIVE   Bilirubin Urine NEGATIVE NEGATIVE   Ketones, ur NEGATIVE NEGATIVE mg/dL   Protein, ur 30 (A) NEGATIVE mg/dL   Nitrite NEGATIVE NEGATIVE   Leukocytes,Ua NEGATIVE NEGATIVE   RBC / HPF 0-5 0 - 5 RBC/hpf   WBC, UA 0-5 0 - 5 WBC/hpf   Bacteria, UA RARE (A) NONE SEEN   Squamous Epithelial / HPF 0-5 0 - 5 /HPF    Comment: Performed at [facility], [address] [place], [place] [address]  Troponin I (High Sensitivity)     Status: Abnormal   Collection Time: 06/02/23  7:34 PM  Result Value Ref Range   Troponin I (High Sensitivity) 18 (H) <18 ng/L    Comment: (NOTE) Elevated high sensitivity troponin I (hsTnI) values and significant  changes across serial measurements may suggest ACS but many other  chronic and acute conditions are known to elevate hsTnI results.  Refer to the "Links" section for chest pain algorithms and additional  guidance. Performed at [facility], [address] [place], [place] [address]   Brain natriuretic peptide     Status: Abnormal   Collection Time: 06/02/23  7:34 PM  Result Value Ref Range   B Natriuretic Peptide 334.0 (H) 0.0 - 100.0 pg/mL    Comment: Performed at [facility], [address], [place], [place] [address]  Troponin I (High Sensitivity)     Status: None   Collection Time: 06/02/23  8:45 PM  Result Value Ref Range   Troponin I (High Sensitivity) 17 <18 ng/L    Comment: (NOTE) Elevated high sensitivity troponin I (hsTnI) values and significant  changes across serial measurements may suggest ACS but many other  chronic and acute conditions are known to elevate hsTnI results.  Refer to the "Links" section for chest pain algorithms and additional  guidance. Performed at [facility],  [address] [place], [place] [address]    DG Chest 2 View  Result Date: 06/02/2023 CLINICAL DATA:  Shortness of breath. EXAM: CHEST - 2 VIEW COMPARISON:  Radiographs 05/29/2023 and 05/13/2023.  CT 09/22/2021. FINDINGS: 1538 hours. Two views obtained. The heart size and mediastinal contours are stable with aortic atherosclerosis. Left subclavian pacemaker leads project over the right ventricular apex and appear unchanged. Stable chronic pleural thickening at the left costophrenic angle, likely related to old left-sided rib fractures. The lungs appear clear. No evidence of significant pleural effusion or pneumothorax. Multiple thoracolumbar compression deformities are grossly unchanged. IMPRESSION: No evidence of acute cardiopulmonary process. 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 Electronically Signed   By: Carey Bullocks M.D.   On: 06/02/2023 16:23    Pending Labs Unresulted Labs (From admission, onward)     Start     Ordered   06/03/23 0500  APTT  Tomorrow morning,   R        06/02/23 2251   06/03/23 0500  Protime-INR  Tomorrow morning,   R        06/02/23 2251   06/03/23 0500  Basic metabolic panel  Tomorrow morning,   R        06/02/23 2251   06/03/23 0500  CBC  Tomorrow morning,   R        06/02/23 2251   06/03/23 0500  Aldosterone + renin activity w/ ratio  Tomorrow morning,   R        06/02/23 2251   06/03/23 0500  Metanephrines, plasma  Tomorrow morning,   R        06/02/23 2252   06/03/23 0500  Vitamin B12  (Anemia Panel (PNL))  Tomorrow morning,   R        06/02/23 2312   06/03/23 0500  Folate  (Anemia Panel (PNL))  Tomorrow morning,   R        06/02/23 2312   06/03/23 0500  Iron and TIBC  (Anemia Panel (PNL))  Tomorrow morning,   R        06/02/23 2312   06/03/23 0500  Ferritin  (Anemia Panel (PNL))  Tomorrow morning,   R        06/02/23 2312   06/03/23 0500  Reticulocytes  (Anemia Panel (PNL))  Tomorrow morning,   R        06/02/23 2312    06/03/23 0500  Protein electrophoresis, serum  Tomorrow morning,   R        06/02/23 2312   06/02/23 2312  Potassium  ONCE - URGENT,   URGENT        06/02/23 2311   06/02/23 2256  Culture, blood (Routine X 2) w Reflex to ID Panel  BLOOD CULTURE X 2,   R (with TIMED occurrences)      06/02/23 2257   06/02/23 1527  Rapid urine drug screen (hospital performed)  ONCE - STAT,   STAT        06/02/23 1527            Vitals/Pain Today's Vitals   06/02/23 1910 06/02/23 1930 06/02/23 2015 06/02/23 2030  BP: (!) 201/82 (!) 188/88 (!) 187/68 (!) 172/72  Pulse: 80 80 80 81  Resp: (!) 27 20 (!) 21 (!) 23  Temp:  98.7 F (37.1 C)    TempSrc:  Oral    SpO2: 100% 100% 100% 100%  Weight:      Height:      PainSc:        Isolation Precautions No active isolations  Medications Medications  albuterol (PROVENTIL) (2.5 MG/3ML) 0.083% nebulizer solution 2.5 mg (has no administration in time range)  aspirin tablet 81 mg (has no administration in time range)  apixaban (ELIQUIS) tablet 2.5 mg (has no administration in time range)  fluticasone furoate-vilanterol (BREO ELLIPTA) 200-25 MCG/ACT 1 puff (has no administration in time range)    And  umeclidinium bromide (INCRUSE ELLIPTA) 62.5 MCG/ACT 1 puff (has no administration in time range)  furosemide (LASIX) tablet 40 mg (has no administration in time range)  hydrOXYzine (ATARAX) tablet 25 mg (has no administration in time range)  montelukast (SINGULAIR) tablet 10 mg (has no administration in time range)  pantoprazole (PROTONIX) EC tablet 40 mg (has no administration in time range)  acetaminophen (TYLENOL) tablet 650 mg (has no administration in time range)    Or  acetaminophen (TYLENOL) suppository 650 mg (has no administration in time range)  polyethylene glycol (MIRALAX / GLYCOLAX) packet 17 g (has no administration in time range)  sodium chloride flush (NS) 0.9 % injection 3 mL (has no administration in time range)  cefTRIAXone  (ROCEPHIN) 1 g in sodium chloride 0.9 % 100 mL IVPB (has no administration in time range)  azithromycin (ZITHROMAX) 500 mg in sodium chloride 0.9 % 250 mL IVPB (has no administration in time range)  diltiazem (CARDIZEM) injection 15 mg (has no administration in time  range)  diltiazem (CARDIZEM CD) 24 hr capsule 120 mg (has no administration in time range)  ondansetron (ZOFRAN-ODT) disintegrating tablet 4 mg (4 mg Oral Given 06/02/23 1516)  ipratropium-albuterol (DUONEB) 0.5-2.5 (3) MG/3ML nebulizer solution 3 mL (3 mLs Nebulization Given 06/02/23 1604)  methylPREDNISolone sodium succinate (SOLU-MEDROL) 125 mg/2 mL injection 125 mg (125 mg Intravenous Given 06/02/23 1535)  diltiazem (CARDIZEM CD) 24 hr capsule 120 mg (120 mg Oral Given 06/02/23 1748)  diltiazem (CARDIZEM) injection 10 mg (10 mg Intravenous Given 06/02/23 1609)  potassium chloride SA (KLOR-CON M) CR tablet 60 mEq (60 mEq Oral Given 06/02/23 1633)  potassium chloride 10 mEq in 100 mL IVPB (0 mEq Intravenous Stopped 06/02/23 1824)  ipratropium-albuterol (DUONEB) 0.5-2.5 (3) MG/3ML nebulizer solution 3 mL (3 mLs Nebulization Given 06/02/23 1701)  albuterol (PROVENTIL) (2.5 MG/3ML) 0.083% nebulizer solution 10 mg (10 mg Nebulization Given 06/02/23 1854)    Mobility walks with person assist     Focused Assessments Pulmonary Assessment Handoff:  Lung sounds: Bilateral Breath Sounds: Expiratory wheezes O2 Device: Nasal Cannula O2 Flow Rate (L/min): 3 L/min    R Recommendations: See Admitting Provider Note  Report given to:   Additional Notes: n/a

## 2023-06-02 NOTE — ED Notes (Signed)
Patient transported to CT 

## 2023-06-02 NOTE — Assessment & Plan Note (Signed)
Based on record review, seems to be new, will check anemia workup

## 2023-06-02 NOTE — Assessment & Plan Note (Signed)
Is a chronic diagnosis, continue with apixaban

## 2023-06-02 NOTE — H&P (Addendum)
History and Physical    Patient: [name] JWJ:[id number] DOB: [birth year] DOA: 06/02/2023 DOS: the patient was seen and examined on 06/02/2023 PCP: Leane Call, PA-C  Patient coming from:  [facility] in from doctor's office  Chief Complaint:  Chief Complaint  Patient presents with   Abdominal Pain   Hypertension   HPI: [name] is a 82 y.o. female with medical history significant of COPD, for which patient uses oxygen at home, but not typically during daytime.  However for the last 2 or 3 weeks patient has had increasing shortness of breath especially with exertion during daytime hours requiring her to intermittently use oxygen during daytime as well.  Patient does not report any cough or any chest pain or any fever (patient seems to have reported chest pain to ER provider, but not to me).  Patient does not report any leg swelling (has chronic swelling that is well controlled with lasix).  Or cramping.  Patient was also at her doctor's office today because she has chronic low back pain for which she has scheduled for intermittent interventional procedure.  However patient was found to have blood pressure over 240, sent to [facility], ER.  ER course is notable for corroboration of blood pressure in the high 240s.  Patient is s/p diltiazem with good clinical response.  Patient has been maintained on 2 L/min of supplementary oxygen.  However no hypoxia was documented.  Patient was felt to have some shortness of breath/increased work of breathing on initial evaluation.  Patient is s/p bronchodilator therapy as well as steroids.  Medical evaluation is sought.  Patient reports that since she is in her stretcher at this time she is totally asymptomatic on 2 L/min of supplementary oxygen.  Husband at bedside, medical evaluation is sought.  Husband was a major contributor.  To history Review of Systems: As mentioned in the history of present illness. All other systems reviewed and are  negative. Past Medical History:  Diagnosis Date   Allergy    Anxiety    Arthritis    Asthma    Bronchitis    CAD (coronary artery disease)    Cancer (HCC)    skin cancer right arm   Carotid artery occlusion    Cataracts, bilateral    CHF (congestive heart failure) (HCC)    Chronic airway obstruction, not elsewhere classified    COPD (chronic obstructive pulmonary disease) (HCC)    Dysphagia, unspecified(787.20)    Generalized convulsive epilepsy without mention of intractable epilepsy    GERD (gastroesophageal reflux disease)    Headache    Hearing impaired    Hyperlipidemia    Hypersomnia with sleep apnea, unspecified    Osteoporosis    Oxygen desaturation during sleep    PRN;02 liters   Paroxysmal atrial tachycardia    PONV (postoperative nausea and vomiting)    " I had problems where my heart starts speeding and fluttering; it goes away by itself ."   Pure hypercholesterolemia    Reflux    Rheumatic fever    as a child   Seizures (HCC)    Shortness of breath 03/11/2010   while laying still   Wears glasses    Wears hearing aids    Past Surgical History:  Procedure Laterality Date   ABDOMINAL HYSTERECTOMY     ABLATION     x 3   CAROTID ENDARTERECTOMY  11/11/2010   left with Dacron patch angioplasty   CESAREAN SECTION  2 c-sections   ENDARTERECTOMY Right 07/19/2016   Procedure: ENDARTERECTOMY RIGHT CAROTID ARTERY;  Surgeon: Chuck Hint, MD;  Location: [facility] OR;  Service: Vascular;  Laterality: Right;   HERNIA REPAIR     PATCH ANGIOPLASTY Right 07/19/2016   Procedure: PATCH ANGIOPLASTY RIGHT CAROTID ARTERY USING HEMASHIELD PLATINUM FINESSE PATCH;  Surgeon: Chuck Hint, MD;  Location: [facility] OR;  Service: Vascular;  Laterality: Right;   SKIN CANCER EXCISION     TONSILLECTOMY AND ADENOIDECTOMY     Social History:  reports that she has been smoking cigarettes. She has a 60 pack-year smoking history. She has never used smokeless tobacco. She reports that  she does not drink alcohol and does not use drugs.  Allergies  Allergen Reactions   Codeine Shortness Of Breath   Augmentin [Amoxicillin-Pot Clavulanate]    Pravastatin Other (See Comments)    Give muscle aches and weakness    Sulfa Antibiotics    Contrast Media [Iodinated Contrast Media] Rash    Itching and rash   Metrizamide Rash    Itching and rash    Family History  Problem Relation Age of Onset   Heart disease Mother        CHF   Hyperlipidemia Mother    Hypertension Mother    Heart attack Mother    Deep vein thrombosis Mother    AAA (abdominal aortic aneurysm) Mother    Heart disease Father    Hyperlipidemia Father    Hypertension Father    Heart attack Father    Asthma Father    Cancer Sister    Hyperlipidemia Sister    Hypertension Sister    Cancer Brother    Hyperlipidemia Brother     Prior to Admission medications   Medication Sig Start Date End Date Taking? Authorizing Provider  acetaminophen (TYLENOL) 325 MG tablet Take 650 mg by mouth every 6 (six) hours as needed.   Yes [provider]  albuterol (VENTOLIN HFA) 108 (90 Base) MCG/ACT inhaler 2 inhalations every 4-6 hrs 02/03/20  Yes Kozlow, Alvira Philips, MD  alendronate (FOSAMAX) 70 MG tablet Take 70 mg by mouth once a week. 07/23/20  Yes [provider]  aspirin 81 MG tablet Take 81 mg by mouth 2 (two) times daily.   Yes [provider]  ELIQUIS 2.5 MG TABS tablet Take 2.5 mg by mouth 2 (two) times daily. 07/21/22  Yes [provider]  ferrous sulfate 325 (65 FE) MG tablet Take 325 mg by mouth daily before breakfast. 05/13/23 06/12/23 Yes [provider]  Fluticasone-Umeclidin-Vilant (TRELEGY ELLIPTA) 200-62.5-25 MCG/INH AEPB Inhale one dose once daily to prevent cough or wheeze.  Rinse, gargle, and spit after use. 04/14/21  Yes Kozlow, Alvira Philips, MD  furosemide (LASIX) 40 MG tablet Take 40 mg by mouth daily. 03/17/21  Yes [provider]  hydrOXYzine (ATARAX) 25 MG  tablet Take 25 mg by mouth 3 (three) times daily as needed for anxiety. 10/06/22  Yes [provider]  ipratropium-albuterol (DUONEB) 0.5-2.5 (3) MG/3ML SOLN Take by nebulization. 02/16/21  Yes [provider]  LORazepam (ATIVAN) 0.5 MG tablet Take 0.5 mg by mouth daily as needed for anxiety. 01/17/20  Yes [provider]  Magnesium 400 MG TABS Take by mouth in the morning and at bedtime.   Yes [provider]  montelukast (SINGULAIR) 10 MG tablet TAKE 1 TABLET BY MOUTH AT BEDTIME 10/04/21  Yes Kozlow, Alvira Philips, MD  pantoprazole (PROTONIX) 40 MG tablet Take 1 tablet (40  mg total) by mouth daily. 12/14/21  Yes Kozlow, Alvira Philips, MD  albuterol (PROVENTIL) (2.5 MG/3ML) 0.083% nebulizer solution Take 2.5 mg by nebulization every 6 (six) hours as needed for wheezing or shortness of breath.    [provider]  doxycycline (VIBRA-TABS) 100 MG tablet Take 100 mg by mouth 2 (two) times daily. Patient not taking: Reported on 06/02/2023 05/13/23   [provider]  ipratropium (ATROVENT) 0.02 % nebulizer solution Take 0.5 mg by nebulization 4 (four) times daily.    [provider]  nystatin cream (MYCOSTATIN) SMARTSIG:Vaginal 3-4 Times Daily Patient not taking: Reported on 04/14/2021 02/04/21   [provider]  predniSONE (STERAPRED UNI-PAK 48 TAB) 5 MG (48) TBPK tablet Take 5 mg by mouth as directed. Patient not taking: Reported on 06/02/2023 04/08/21   [provider]  Spacer/Aero-Holding Chambers (AEROCHAMBER PLUS) inhaler Use as directed with inhaler. 04/08/20   Jessica Priest, MD    Physical Exam: Vitals:   06/02/23 1910 06/02/23 1930 06/02/23 2015 06/02/23 2030  BP: (!) 201/82 (!) 188/88 (!) 187/68 (!) 172/72  Pulse: 80 80 80 81  Resp: (!) 27 20 (!) 21 (!) 23  Temp:  98.7 F (37.1 C)    TempSrc:  Oral    SpO2: 100% 100% 100% 100%  Weight:      Height:       General: Patient on 2 L/min of supplementary oxygen, no distress Alert  awake giving coherent account of her symptoms Hard of hearing Respiratory exam: Left basilar crackles are heard in lateral posterior and anterior aspects Otherwise air entry is diffusely diminished with occasional expiratory wheezes Cardiovascular exam S1-S2 normal Abdomen soft nontender Extremities warm without edema Data Reviewed:  Labs on Admission:  Results for orders placed or performed during the hospital encounter of 06/02/23 (from the past 24 hour(s))  Lipase, blood     Status: None   Collection Time: 06/02/23  2:56 PM  Result Value Ref Range   Lipase 27 11 - 51 U/L  Comprehensive metabolic panel     Status: Abnormal   Collection Time: 06/02/23  2:56 PM  Result Value Ref Range   Sodium 130 (L) 135 - 145 mmol/L   Potassium 2.9 (L) 3.5 - 5.1 mmol/L   Chloride 86 (L) 98 - 111 mmol/L   CO2 29 22 - 32 mmol/L   Glucose, Bld 111 (H) 70 - 99 mg/dL   BUN 12 8 - 23 mg/dL   Creatinine, Ser 1.61 (H) 0.44 - 1.00 mg/dL   Calcium 9.2 8.9 - 09.6 mg/dL   Total Protein 6.7 6.5 - 8.1 g/dL   Albumin 3.6 3.5 - 5.0 g/dL   AST 23 15 - 41 U/L   ALT 16 0 - 44 U/L   Alkaline Phosphatase 56 38 - 126 U/L   Total Bilirubin 0.9 0.3 - 1.2 mg/dL   GFR, Estimated 52 (L) >60 mL/min   Anion gap 15 5 - 15  CBC     Status: Abnormal   Collection Time: 06/02/23  2:56 PM  Result Value Ref Range   WBC 13.9 (H) 4.0 - 10.5 K/uL   RBC 4.58 3.87 - 5.11 MIL/uL   Hemoglobin 11.3 (L) 12.0 - 15.0 g/dL   HCT 04.5 40.9 - 81.1 %   MCV 79.0 (L) 80.0 - 100.0 fL   MCH 24.7 (L) 26.0 - 34.0 pg   MCHC 31.2 30.0 - 36.0 g/dL   RDW 91.4 (H) 78.2 - 95.6 %   Platelets  411 (H) 150 - 400 K/uL   nRBC 0.0 0.0 - 0.2 %  Urinalysis, Routine w reflex microscopic -Urine, Clean Catch     Status: Abnormal   Collection Time: 06/02/23  4:37 PM  Result Value Ref Range   Color, Urine STRAW (A) YELLOW   APPearance CLEAR CLEAR   Specific Gravity, Urine 1.005 1.005 - 1.030   pH 7.0 5.0 - 8.0   Glucose, UA NEGATIVE NEGATIVE mg/dL    Hgb urine dipstick NEGATIVE NEGATIVE   Bilirubin Urine NEGATIVE NEGATIVE   Ketones, ur NEGATIVE NEGATIVE mg/dL   Protein, ur 30 (A) NEGATIVE mg/dL   Nitrite NEGATIVE NEGATIVE   Leukocytes,Ua NEGATIVE NEGATIVE   RBC / HPF 0-5 0 - 5 RBC/hpf   WBC, UA 0-5 0 - 5 WBC/hpf   Bacteria, UA RARE (A) NONE SEEN   Squamous Epithelial / HPF 0-5 0 - 5 /HPF  Troponin I (High Sensitivity)     Status: Abnormal   Collection Time: 06/02/23  7:34 PM  Result Value Ref Range   Troponin I (High Sensitivity) 18 (H) <18 ng/L  Brain natriuretic peptide     Status: Abnormal   Collection Time: 06/02/23  7:34 PM  Result Value Ref Range   B Natriuretic Peptide 334.0 (H) 0.0 - 100.0 pg/mL  Troponin I (High Sensitivity)     Status: None   Collection Time: 06/02/23  8:45 PM  Result Value Ref Range   Troponin I (High Sensitivity) 17 <18 ng/L   Basic Metabolic Panel: Recent Labs  Lab 06/02/23 1456  NA 130*  K 2.9*  CL 86*  CO2 29  GLUCOSE 111*  BUN 12  CREATININE 1.08*  CALCIUM 9.2   Liver Function Tests: Recent Labs  Lab 06/02/23 1456  AST 23  ALT 16  ALKPHOS 56  BILITOT 0.9  PROT 6.7  ALBUMIN 3.6   Recent Labs  Lab 06/02/23 1456  LIPASE 27   No results for input(s): "AMMONIA" in the last 168 hours. CBC: Recent Labs  Lab 06/02/23 1456  WBC 13.9*  HGB 11.3*  HCT 36.2  MCV 79.0*  PLT 411*   Cardiac Enzymes: Recent Labs  Lab 06/02/23 1934 06/02/23 2045  TROPONINIHS 18* 17    BNP (last 3 results) No results for input(s): "PROBNP" in the last 8760 hours. CBG: No results for input(s): "GLUCAP" in the last 168 hours.  Radiological Exams on Admission:  DG Chest 2 View  Result Date: 06/02/2023 CLINICAL DATA:  Shortness of breath. EXAM: CHEST - 2 VIEW COMPARISON:  Radiographs 05/29/2023 and 05/13/2023.  CT 09/22/2021. FINDINGS: 1538 hours. Two views obtained. The heart size and mediastinal contours are stable with aortic atherosclerosis. Left subclavian pacemaker leads project over  the right ventricular apex and appear unchanged. Stable chronic pleural thickening at the left costophrenic angle, likely related to old left-sided rib fractures. The lungs appear clear. No evidence of significant pleural effusion or pneumothorax. Multiple thoracolumbar compression deformities are grossly unchanged. IMPRESSION: No evidence of acute cardiopulmonary process. 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 Electronically Signed   By: Carey Bullocks M.D.   On: 06/02/2023 16:23    EKG: Independently reviewed.  Flutter with suspected paced ventricular rhythm   Assessment and Plan: * Hypertensive emergency Was having shortness of breath symptoms on initial evaluation and markedly elevated blood pressure.  Patient has had blood pressure improvement with diltiazem.  Given COPD, will avoid beta-blocker for now and continue with diltiazem as needed.  Patient was supposed to  be on diltiazem in the past, I believe for rate control of atrial fibrillation.  However she had a pacemaker put in ?after ablatin and this was discontinued.  I am not sure if she had any indication of hypertension for diltiazem.  Patient is noted to be hypokalemic therefore I will check renin aldosterone, I will also check serum metanephrines  Patient describes rather frequent visits with various healthcare providers in the last several months.  Therefore I do not suspect that this is hypertension that has been going on for long periods of time and has been missed because patient was not having contact with the healthcare system.  It seems to me that this is recent in onset.  Patient denies any over-the-counter medications such as phenylephrine or similar agents that would explain presentation  Leg swelling Reports that this is a chronic issue for which patient is treated with Lasix.  At this time slight elevation in creatinine is noted, however I do not suspect that the patient is dehydrated  because of Lasix, I suspect the creatinine elevation is due to high blood pressure.  We will continue Lasix, monitor weight intake and output  Anemia Based on record review, seems to be new, will check anemia workup  COPD with acute exacerbation (HCC) Patient is s/p IV steroids as well as inhaled bronchodilator therapy.  At this time I will continue continue with inhaled bronchodilator therapy as well as inhaled steroids.  I will defer systemic steroids at this time to repeat physical exam in the morning.  I did hear some crackles in the left lung therefore my concern for pneumonia versus COPD exacerbation due to infection is high.  Therefore we will treat the patient with ceftriaxone and azithromycin.  Please note that the patient's allergy to Augmentin.  However patient has tolerated amoxicillin as well as cefdinir in the past.  Therefore I suspect her allergy is to clavulanic acid.  A-fib [facility]) Is a chronic diagnosis, continue with apixaban      Advance Care Planning:   Code Status: Full Code   Consults: None at this time  Family Communication: Husband at bedside during this encounter, all questions answered  Severity of Illness: The appropriate patient status for this patient is INPATIENT. Inpatient status is judged to be reasonable and necessary in order to provide the required intensity of service to ensure the patient's safety. The patient's presenting symptoms, physical exam findings, and initial radiographic and laboratory data in the context of their chronic comorbidities is felt to place them at high risk for further clinical deterioration. Furthermore, it is not anticipated that the patient will be medically stable for discharge from the hospital within 2 midnights of admission.   * I certify that at the point of admission it is my clinical judgment that the patient will require inpatient hospital care spanning beyond 2 midnights from the point of admission due to high intensity of  service, high risk for further deterioration and high frequency of surveillance required.*  Author: Nolberto Hanlon, MD 06/02/2023 11:04 PM  For on call review [email or web address].

## 2023-06-03 ENCOUNTER — Inpatient Hospital Stay (HOSPITAL_COMMUNITY): Payer: No Typology Code available for payment source

## 2023-06-03 DIAGNOSIS — I161 Hypertensive emergency: Secondary | ICD-10-CM | POA: Diagnosis not present

## 2023-06-03 LAB — BLOOD CULTURE ID PANEL (REFLEXED) - BCID2

## 2023-06-03 LAB — PROCALCITONIN: Procalcitonin: <0.1 ng/mL

## 2023-06-03 LAB — RESPIRATORY PANEL BY PCR

## 2023-06-03 LAB — SARS CORONAVIRUS 2 BY RT PCR: SARS Coronavirus 2 by RT PCR: NEGATIVE

## 2023-06-03 MED ORDER — VANCOMYCIN HCL 1250 MG/250ML IV SOLN
1250.0000 mg | Freq: Once | INTRAVENOUS | Status: AC
  Administered 2023-06-03: 1250 mg via INTRAVENOUS
  Filled 2023-06-03: qty 250

## 2023-06-03 MED ORDER — VANCOMYCIN HCL IN DEXTROSE 1-5 GM/200ML-% IV SOLN
1000.0000 mg | INTRAVENOUS | Status: DC
  Filled 2023-06-03: qty 200

## 2023-06-03 MED ORDER — MELATONIN 3 MG PO TABS
3.0000 mg | ORAL_TABLET | Freq: Every evening | ORAL | Status: DC | PRN
  Administered 2023-06-03 – 2023-06-15 (×12): 3 mg via ORAL
  Filled 2023-06-03 (×12): qty 1

## 2023-06-03 MED ORDER — CARVEDILOL 3.125 MG PO TABS
3.1250 mg | ORAL_TABLET | Freq: Two times a day (BID) | ORAL | Status: DC
  Administered 2023-06-04 – 2023-06-05 (×4): 3.125 mg via ORAL
  Filled 2023-06-03 (×4): qty 1

## 2023-06-03 NOTE — Progress Notes (Signed)
PHARMACY - PHYSICIAN COMMUNICATION CRITICAL VALUE ALERT - BLOOD CULTURE IDENTIFICATION (BCID)  [name] is an 82 y.o. female who presented to [facility] on 06/02/2023 with a chief complaint of hypertensive emergency.  Assessment: Pt with 2/4 bottles (1 from each set) growing E. faecium (no resistance). ID will receive an automatic consult. Patient has an allergy listed to Augmentin. She has tolerated cefdinir and amoxicillin multiple times in the past and husband thinks the allergy is related to clavulanic acid.   Name of physician (or Provider) Contacted: Dr. Dartha Lodge  Current antibiotics: Rocephin and azithromycin   Changes to prescribed antibiotics recommended:  Discontinue Rocephin and azithromycin  Begin vancomycin per pharmacy consult. Will follow-up ID recommendations in AM.  Results for orders placed or performed during the hospital encounter of 06/02/23  Blood Culture ID Panel (Reflexed) (Collected: 06/03/2023 12:54 AM)  Result Value Ref Range   Enterococcus faecalis NOT DETECTED NOT DETECTED   Enterococcus Faecium DETECTED (A) NOT DETECTED   Listeria monocytogenes NOT DETECTED NOT DETECTED   Staphylococcus species NOT DETECTED NOT DETECTED   Staphylococcus aureus (BCID) NOT DETECTED NOT DETECTED   Staphylococcus epidermidis NOT DETECTED NOT DETECTED   Staphylococcus lugdunensis NOT DETECTED NOT DETECTED   Streptococcus species NOT DETECTED NOT DETECTED   Streptococcus agalactiae NOT DETECTED NOT DETECTED   Streptococcus pneumoniae NOT DETECTED NOT DETECTED   Streptococcus pyogenes NOT DETECTED NOT DETECTED   A.calcoaceticus-baumannii NOT DETECTED NOT DETECTED   Bacteroides fragilis NOT DETECTED NOT DETECTED   Enterobacterales NOT DETECTED NOT DETECTED   Enterobacter cloacae complex NOT DETECTED NOT DETECTED   Escherichia coli NOT DETECTED NOT DETECTED   Klebsiella aerogenes NOT DETECTED NOT DETECTED   Klebsiella oxytoca NOT DETECTED NOT DETECTED   Klebsiella  pneumoniae NOT DETECTED NOT DETECTED   Proteus species NOT DETECTED NOT DETECTED   Salmonella species NOT DETECTED NOT DETECTED   Serratia marcescens NOT DETECTED NOT DETECTED   Haemophilus influenzae NOT DETECTED NOT DETECTED   Neisseria meningitidis NOT DETECTED NOT DETECTED   Pseudomonas aeruginosa NOT DETECTED NOT DETECTED   Stenotrophomonas maltophilia NOT DETECTED NOT DETECTED   Candida albicans NOT DETECTED NOT DETECTED   Candida auris NOT DETECTED NOT DETECTED   Candida glabrata NOT DETECTED NOT DETECTED   Candida krusei NOT DETECTED NOT DETECTED   Candida parapsilosis NOT DETECTED NOT DETECTED   Candida tropicalis NOT DETECTED NOT DETECTED   Cryptococcus neoformans/gattii NOT DETECTED NOT DETECTED   Vancomycin resistance NOT DETECTED NOT DETECTED    Roslyn Smiling, PharmD PGY1 Pharmacy Resident 06/03/2023 6:44 PM

## 2023-06-03 NOTE — Plan of Care (Signed)
  Problem: Education: Goal: Knowledge of General Education information will improve Description: Including pain rating scale, medication(s)/side effects and non-pharmacologic comfort measures Outcome: Progressing   Problem: Health Behavior/Discharge Planning: Goal: Ability to manage health-related needs will improve Outcome: Progressing   Problem: Clinical Measurements: Goal: Cardiovascular complication will be avoided Outcome: Progressing   Problem: Clinical Measurements: Goal: Respiratory complications will improve Outcome: Progressing   Problem: Activity: Goal: Risk for activity intolerance will decrease Outcome: Progressing   Problem: Elimination: Goal: Will not experience complications related to bowel motility Outcome: Progressing  Problem: Pain Managment: Goal: General experience of comfort will improve Outcome: Progressing   Problem: Safety: Goal: Ability to remain free from injury will improve Outcome: Progressing   Problem: Skin Integrity: Goal: Risk for impaired skin integrity will decrease Outcome: Progressing

## 2023-06-03 NOTE — Progress Notes (Signed)
[facility] night cross cover note:   I was notified by RN of the patient's request for a sleep aid. I subsequently placed order for prn melatonin for insomnia.     Justin Howerter, DO Hospitalist  

## 2023-06-03 NOTE — Plan of Care (Signed)
  Problem: Education: Goal: Knowledge of General Education information will improve Description: Including pain rating scale, medication(s)/side effects and non-pharmacologic comfort measures Outcome: Progressing   Problem: Clinical Measurements: Goal: Ability to maintain clinical measurements within normal limits will improve Outcome: Progressing Goal: Will remain free from infection Outcome: Progressing Goal: Respiratory complications will improve Outcome: Progressing Goal: Cardiovascular complication will be avoided Outcome: Progressing   Problem: Coping: Goal: Level of anxiety will decrease Outcome: Progressing   

## 2023-06-03 NOTE — Progress Notes (Signed)
Pharmacy Antibiotic Note  [name] is a 82 y.o. female admitted on 06/02/2023 with bacteremia.  Pharmacy has been consulted for vancomycin dosing.   Patient is afebrile and WBC is 6.8 (downtrending from 13.9 on 8/9).   Plan: Vancomycin bolus of 1,250 mg IV once. Vancomycin 1,000 mg IV every 48 hours. Goal AUC of 400-550 (estimated AUC 473.1 using Scr of 1.11) Monitor renal function, microbiology data, and patient's clinically stability.  Follow-up ID recommendations.   Height: 5' (152.4 cm) Weight: 51.7 kg (114 lb) IBW/kg (Calculated) : 45.5  Temp (24hrs), Avg:98.2 F (36.8 C), Min:97.6 F (36.4 C), Max:98.7 F (37.1 C)  Recent Labs  Lab 06/02/23 1456 06/03/23 0054  WBC 13.9* 6.8  CREATININE 1.08* 1.11*    Estimated Creatinine Clearance: 29 mL/min (A) (by C-G formula based on SCr of 1.11 mg/dL (H)).    Allergies  Allergen Reactions   Augmentin [Amoxicillin-Pot Clavulanate] Shortness Of Breath and Dermatitis    Patient has tolerated amoxicillin as well as cefdinir in the past.  Several times.  Husband/patient feels that her allergy is to the other agent and Augmentin, namely clavulanic acid   Codeine Shortness Of Breath   Pravastatin Other (See Comments)    Give muscle aches and weakness    Sulfa Antibiotics    Contrast Media [Iodinated Contrast Media] Rash    Itching and rash   Metrizamide Rash    Itching and rash    Antimicrobials this admission: Azithromycin 8/10 x1 Rocephin 8/10 x1 Vancomycin 8/10 >>  Microbiology results: 8/10 BCx: Enterococcus Faecium 8/10 Respiratory Panel: negative  Thank you for allowing pharmacy to be a part of this patient's care.  Roslyn Smiling, PharmD PGY1 Pharmacy Resident 06/03/2023 7:07 PM

## 2023-06-03 NOTE — Progress Notes (Addendum)
PROGRESS NOTE    [name]  WUJ:[id number] DOB: 09/08/1941 DOA: 06/02/2023 PCP: Leane Call, PA-C  Outpatient Specialists:     Brief Narrative:  Patient is an 82 year old female with past medical history significant for COPD on home oxygen, coronary artery disease, carotid artery occlusion, congestive heart failure, hyperlipidemia, paroxysmal atrial tachycardia, rheumatic fever and seizure.  There is also documentation of asthma.  Patient has chronic low back pain.  Patient was admitted with hypertensive emergency (systolic blood pressure of 240 mmHg).  Documented leg edema.  Leg edema has resolved.  Blood pressure slowly improving.  06/03/2023: Patient seen alongside patient's husband.  Patient is eager to be discharged back home.  Patient reports abdominal distention.  Abdominal x-ray revealed gaseous distention with significant amount of stools.  Suspect patient has been on chronic opiates.  Will try enema.  Patient may end up needing peripheral opiate blocker.  Continue to optimize blood pressure.  Likely discharge back home tomorrow.  Will optimize shortness of breath treatment as well.   Assessment & Plan:   Principal Problem:   Hypertensive emergency Active Problems:   A-fib (HCC)   COPD with acute exacerbation (HCC)   Anemia   Leg swelling   Hypertensive emergency: -On presentation, systolic blood pressure of 240 mmHg. -Currently on Cardizem oral/drip, and Lasix. -Systolic blood pressure has risen up to 170 mmHg today. -Start Coreg 3.125 Mg p.o. twice daily.  Monitor respiratory symptoms closely. -Goal blood pressure should be less than 130/80 mmHg.   -Optimize pain control and alleviate any inconveniences.    Leg swelling -Resolved. -Continue Lasix. -Monitor renal function and electrolytes.    Bacteremia: -2 out of blood cultures said to be growing Enterococcus. -ID team will be consulted. -Patient may not have true allergy (according to the pharmacy  team). -TTE. -Infectious disease team to advise if there is need to pursue TEE. -Likely, Rocephin and azithromycin will be discontinued.   Anemia Based on record review, seems to be new, will check anemia workup   COPD with acute exacerbation (HCC) -Continue nebulizer treatment. -History of asthma as noted. -Continue Singulair. -Peak flow daily. -Monitor symptoms closely was on Coreg.  A-fib (HCC) -Chronic diagnosis, continue apixaban. -Rate controlled.  Constipation/gaseous abdominal distention: -Try soapsuds enema. -Laxatives. -Patient may need peripheral opiate blocker. -Minimize opiates as much as possible.   DVT prophylaxis: Apixaban. Code Status: Full code. Family Communication: Husband. Disposition Plan: Likely discharge back home tomorrow.  Patient remains inpatient.   Consultants:  None.  Procedures:  None.  Antimicrobials:  IV Rocephin IV azithromycin   Subjective: -Blood pressure is improving. -Shortness of breath.  Objective: Vitals:   06/03/23 0009 06/03/23 0010 06/03/23 0227 06/03/23 0523  BP: (!) 192/72 (!) 184/79 (!) 189/71 (!) 156/75  Pulse:   84 80  Resp:   18 18  Temp:   98.4 F (36.9 C) 98.7 F (37.1 C)  TempSrc:   Oral Oral  SpO2:    94%  Weight:   51.7 kg   Height:        Intake/Output Summary (Last 24 hours) at 06/03/2023 0818 Last data filed at 06/03/2023 0528 Gross per 24 hour  Intake 350 ml  Output 300 ml  Net 50 ml   Filed Weights   06/02/23 1435 06/03/23 0227  Weight: 52.2 kg 51.7 kg    Examination:  General exam: Appears calm and comfortable. Respiratory system: Decreased air entry with wheezing. Cardiovascular system: S1 & S2 heard Gastrointestinal system: Abdomen is soft  and nontender.  Central nervous system: Alert and oriented.  Patient moves all extremities.   Extremities: No leg edema.  Data Reviewed: I have personally reviewed following labs and imaging studies  CBC: Recent Labs  Lab 06/02/23 1456  06/03/23 0054  WBC 13.9* 6.8  HGB 11.3* 10.1*  HCT 36.2 32.6*  MCV 79.0* 79.1*  PLT 411* 359   Basic Metabolic Panel: Recent Labs  Lab 06/02/23 1456 06/03/23 0054  NA 130* 133*  K 2.9* 4.2  4.0  CL 86* 92*  CO2 29 26  GLUCOSE 111* 212*  BUN 12 11  CREATININE 1.08* 1.11*  CALCIUM 9.2 8.8*   GFR: Estimated Creatinine Clearance: 29 mL/min (A) (by C-G formula based on SCr of 1.11 mg/dL (H)). Liver Function Tests: Recent Labs  Lab 06/02/23 1456  AST 23  ALT 16  ALKPHOS 56  BILITOT 0.9  PROT 6.7  ALBUMIN 3.6   Recent Labs  Lab 06/02/23 1456  LIPASE 27   No results for input(s): "AMMONIA" in the last 168 hours. Coagulation Profile: Recent Labs  Lab 06/03/23 0054  INR 1.2   Cardiac Enzymes: No results for input(s): "CKTOTAL", "CKMB", "CKMBINDEX", "TROPONINI" in the last 168 hours. BNP (last 3 results) No results for input(s): "PROBNP" in the last 8760 hours. HbA1C: No results for input(s): "HGBA1C" in the last 72 hours. CBG: No results for input(s): "GLUCAP" in the last 168 hours. Lipid Profile: No results for input(s): "CHOL", "HDL", "LDLCALC", "TRIG", "CHOLHDL", "LDLDIRECT" in the last 72 hours. Thyroid Function Tests: No results for input(s): "TSH", "T4TOTAL", "FREET4", "T3FREE", "THYROIDAB" in the last 72 hours. Anemia Panel: Recent Labs    06/03/23 0054  VITAMINB12 5,687*  FOLATE 31.3  FERRITIN 47  TIBC 451*  IRON 23*  RETICCTPCT 3.4*   Urine analysis:    Component Value Date/Time   COLORURINE STRAW (A) 06/02/2023 1637   APPEARANCEUR CLEAR 06/02/2023 1637   LABSPEC 1.005 06/02/2023 1637   PHURINE 7.0 06/02/2023 1637   GLUCOSEU NEGATIVE 06/02/2023 1637   HGBUR NEGATIVE 06/02/2023 1637   BILIRUBINUR NEGATIVE 06/02/2023 1637   KETONESUR NEGATIVE 06/02/2023 1637   PROTEINUR 30 (A) 06/02/2023 1637   UROBILINOGEN 0.2 11/11/2010 0557   NITRITE NEGATIVE 06/02/2023 1637   LEUKOCYTESUR NEGATIVE 06/02/2023 1637   Sepsis  Labs: @LABRCNTIP (procalcitonin:4,lacticidven:4)  ) Recent Results (from the past 240 hour(s))  SARS Coronavirus 2 by RT PCR (hospital order, performed in [facility] hospital lab) *cepheid single result test* Anterior Nasal Swab     Status: None   Collection Time: 06/03/23 12:50 AM   Specimen: Anterior Nasal Swab  Result Value Ref Range Status   SARS Coronavirus 2 by RT PCR NEGATIVE NEGATIVE Final    Comment: Performed at [facility], [address], [place], [place] [address]  Respiratory (~20 pathogens) panel by PCR     Status: None   Collection Time: 06/03/23 12:51 AM   Specimen: Nasopharyngeal Swab; Respiratory  Result Value Ref Range Status   Adenovirus NOT DETECTED NOT DETECTED Final   Coronavirus 229E NOT DETECTED NOT DETECTED Final    Comment: (NOTE) The Coronavirus on the Respiratory Panel, DOES NOT test for the novel  Coronavirus (2019 nCoV)    Coronavirus HKU1 NOT DETECTED NOT DETECTED Final   Coronavirus NL63 NOT DETECTED NOT DETECTED Final   Coronavirus OC43 NOT DETECTED NOT DETECTED Final   Metapneumovirus NOT DETECTED NOT DETECTED Final   Rhinovirus / Enterovirus NOT DETECTED NOT DETECTED Final   Influenza A NOT DETECTED  NOT DETECTED Final   Influenza B NOT DETECTED NOT DETECTED Final   Parainfluenza Virus 1 NOT DETECTED NOT DETECTED Final   Parainfluenza Virus 2 NOT DETECTED NOT DETECTED Final   Parainfluenza Virus 3 NOT DETECTED NOT DETECTED Final   Parainfluenza Virus 4 NOT DETECTED NOT DETECTED Final   Respiratory Syncytial Virus NOT DETECTED NOT DETECTED Final   Bordetella pertussis NOT DETECTED NOT DETECTED Final   Bordetella Parapertussis NOT DETECTED NOT DETECTED Final   Chlamydophila pneumoniae NOT DETECTED NOT DETECTED Final   Mycoplasma pneumoniae NOT DETECTED NOT DETECTED Final    Comment: Performed at [facility], [address], [place], [place] [address]         Radiology Studies: DG Chest 2 View  Result Date:  06/02/2023 CLINICAL DATA:  Shortness of breath. EXAM: CHEST - 2 VIEW COMPARISON:  Radiographs 05/29/2023 and 05/13/2023.  CT 09/22/2021. FINDINGS: 1538 hours. Two views obtained. The heart size and mediastinal contours are stable with aortic atherosclerosis. Left subclavian pacemaker leads project over the right ventricular apex and appear unchanged. Stable chronic pleural thickening at the left costophrenic angle, likely related to old left-sided rib fractures. The lungs appear clear. No evidence of significant pleural effusion or pneumothorax. Multiple thoracolumbar compression deformities are grossly unchanged. IMPRESSION: No evidence of acute cardiopulmonary process. 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 Electronically Signed   By: Carey Bullocks M.D.   On: 06/02/2023 16:23        Scheduled Meds:  apixaban  2.5 mg Oral BID   aspirin EC  81 mg Oral Daily   diltiazem  120 mg Oral Daily   fluticasone furoate-vilanterol  1 puff Inhalation Daily   And   umeclidinium bromide  1 puff Inhalation Daily   furosemide  40 mg Oral Daily   montelukast  10 mg Oral QHS   pantoprazole  40 mg Oral Daily   sodium chloride flush  3 mL Intravenous Q12H   Continuous Infusions:  azithromycin Stopped (06/03/23 0358)   cefTRIAXone (ROCEPHIN)  IV Stopped (06/03/23 0132)     LOS: 1 day    Time spent: 55 minutes    Berton Mount, MD  [facility] Pager #: [phone] 7PM-7AM contact night coverage as above

## 2023-06-04 ENCOUNTER — Inpatient Hospital Stay (HOSPITAL_COMMUNITY): Payer: No Typology Code available for payment source

## 2023-06-04 DIAGNOSIS — R7881 Bacteremia: Secondary | ICD-10-CM | POA: Diagnosis not present

## 2023-06-04 DIAGNOSIS — I161 Hypertensive emergency: Secondary | ICD-10-CM | POA: Diagnosis not present

## 2023-06-04 LAB — CULTURE, BLOOD (ROUTINE X 2)
Culture: NO GROWTH
Culture: NO GROWTH
Special Requests: ADEQUATE

## 2023-06-04 LAB — ECHOCARDIOGRAM COMPLETE
AR max vel: 2.24 cm2
AV Peak grad: 5 mmHg
Ao pk vel: 1.12 m/s
Area-P 1/2: 4.31 cm2
Calc EF: 51.8 %
Height: 60 in
MV M vel: 4.28 m/s
MV Peak grad: 73.3 mmHg
S' Lateral: 2.6 cm
Single Plane A2C EF: 55 %
Single Plane A4C EF: 50.4 %
Weight: 2056.45 [oz_av]

## 2023-06-04 LAB — CBC WITH DIFFERENTIAL/PLATELET
Abs Immature Granulocytes: 0 10*3/uL (ref 0.00–0.07)
Basophils Absolute: 0 10*3/uL (ref 0.0–0.1)
Basophils Relative: 0 %
Eosinophils Absolute: 0 10*3/uL (ref 0.0–0.5)
Eosinophils Relative: 0 %
HCT: 29.5 % — ABNORMAL LOW (ref 36.0–46.0)
Hemoglobin: 9.1 g/dL — ABNORMAL LOW (ref 12.0–15.0)
Lymphocytes Relative: 6 %
Lymphs Abs: 1.1 10*3/uL (ref 0.7–4.0)
MCH: 24.4 pg — ABNORMAL LOW (ref 26.0–34.0)
MCHC: 30.8 g/dL (ref 30.0–36.0)
MCV: 79.1 fL — ABNORMAL LOW (ref 80.0–100.0)
Monocytes Absolute: 1.3 10*3/uL — ABNORMAL HIGH (ref 0.1–1.0)
Monocytes Relative: 7 %
Neutro Abs: 15.7 10*3/uL — ABNORMAL HIGH (ref 1.7–7.7)
Neutrophils Relative %: 87 %
Platelets: 319 10*3/uL (ref 150–400)
RBC: 3.73 MIL/uL — ABNORMAL LOW (ref 3.87–5.11)
RDW: 22.6 % — ABNORMAL HIGH (ref 11.5–15.5)
WBC: 18.1 10*3/uL — ABNORMAL HIGH (ref 4.0–10.5)
nRBC: 0 % (ref 0.0–0.2)
nRBC: 0 /100{WBCs}

## 2023-06-04 LAB — RENAL FUNCTION PANEL
Albumin: 3.1 g/dL — ABNORMAL LOW (ref 3.5–5.0)
Anion gap: 12 (ref 5–15)
BUN: 21 mg/dL (ref 8–23)
CO2: 28 mmol/L (ref 22–32)
Calcium: 8.7 mg/dL — ABNORMAL LOW (ref 8.9–10.3)
Chloride: 95 mmol/L — ABNORMAL LOW (ref 98–111)
Creatinine, Ser: 1.26 mg/dL — ABNORMAL HIGH (ref 0.44–1.00)
GFR, Estimated: 43 mL/min — ABNORMAL LOW (ref >60)
Glucose, Bld: 147 mg/dL — ABNORMAL HIGH (ref 70–99)
Phosphorus: 4.7 mg/dL — ABNORMAL HIGH (ref 2.5–4.6)
Potassium: 4.1 mmol/L (ref 3.5–5.1)
Sodium: 135 mmol/L (ref 135–145)

## 2023-06-04 LAB — MAGNESIUM: Magnesium: 2 mg/dL (ref 1.7–2.4)

## 2023-06-04 MED ORDER — OXYCODONE-ACETAMINOPHEN 5-325 MG PO TABS
1.0000 | ORAL_TABLET | Freq: Four times a day (QID) | ORAL | Status: DC | PRN
  Administered 2023-06-04 – 2023-06-05 (×2): 1 via ORAL
  Filled 2023-06-04 (×2): qty 1

## 2023-06-04 MED ORDER — NALOXONE HCL 0.4 MG/ML IJ SOLN: 0.4000 mg | INTRAMUSCULAR | Status: DC | PRN

## 2023-06-04 NOTE — Progress Notes (Signed)
PROGRESS NOTE    [name]  ZHY:[id number] DOB: 05/30/1941 DOA: 06/16/2023 PCP: Leane Call, PA-C  Outpatient Specialists:     Brief Narrative:  Patient is an 82 year old female with past medical history significant for COPD on home oxygen, coronary artery disease, carotid artery occlusion, congestive heart failure, hyperlipidemia, paroxysmal atrial tachycardia, rheumatic fever and seizure.  There is also documentation of asthma.  Patient has chronic low back pain.  Patient was admitted with hypertensive emergency (systolic blood pressure of 240 mmHg).  Documented leg edema.  Leg edema has resolved.  Blood pressure slowly improving.  06/03/2023: Patient seen alongside patient's husband.  Patient is eager to be discharged back home.  Patient reports abdominal distention.  Abdominal x-ray revealed gaseous distention with significant amount of stools.  Suspect patient has been on chronic opiates.  Will try enema.  Patient may end up needing peripheral opiate blocker.  Continue to optimize blood pressure.  Likely discharge back home tomorrow.  Will optimize shortness of breath treatment as well.  06/04/2023: Patient seen alongside patient's husband.  No new complaints.  Blood pressure is controlled.  Systolic blood pressure is 130 mmHg.  Blood culture grew Enterococcus faecium 2 out of 2 bottles.  Patient is currently on IV vancomycin.  Follow final blood culture result.  Follow results of echocardiogram.  Consult infectious disease in the morning.  On further questioning, patient and patient husband reported that patient has had back pain, back joint injection in the past and recent infection around the nose that required drainage.  Patient has a pacemaker placed.  MRI, if needed, will need to be planned.   Assessment & Plan:   Principal Problem:   Hypertensive emergency Active Problems:   A-fib (HCC)   COPD with acute exacerbation (HCC)   Anemia   Leg swelling   Hypertensive  emergency: -On presentation, systolic blood pressure of 240 mmHg. -Currently on Cardizem oral/drip, and Lasix. -Systolic blood pressure has risen up to 170 mmHg today. -Start Coreg 3.125 Mg p.o. twice daily.  Monitor respiratory symptoms closely. -Goal blood pressure should be less than 130/80 mmHg.   -Optimize pain control and alleviate any inconveniences. 06/04/2023: Blood pressure is controlled.    Leg swelling -Resolved. -Continue Lasix. -Monitor renal function and electrolytes.   06/04/2023: Resolved.  Bacteremia: -2 out of blood cultures said to be growing Enterococcus. -ID team will be consulted. -Patient may not have true allergy (according to the pharmacy team). -TTE. -Infectious disease team to advise if there is need to pursue TEE. -Likely, Rocephin and azithromycin will be discontinued. 06/04/2023: Steroid injections to the back previously, as well as, recent infection around the nose that required I&D.   Anemia Based on record review, seems to be new, will check anemia workup   COPD with acute exacerbation (HCC) -Continue nebulizer treatment. -History of asthma as noted. -Continue Singulair. -Peak flow daily. -Monitor symptoms closely was on Coreg.  A-fib (HCC) -Chronic diagnosis, continue apixaban. -Rate controlled.  Constipation/gaseous abdominal distention: -Try soapsuds enema. -Laxatives. -Patient may need peripheral opiate blocker. -Minimize opiates as much as possible.   DVT prophylaxis: Apixaban. Code Status: Full code. Family Communication: Husband. Disposition Plan: Likely discharge back home tomorrow.  Patient remains inpatient.   Consultants:  None.  Procedures:  None.  Antimicrobials:  IV Rocephin IV azithromycin   Subjective: -No fever or chills. -No shortness of breath.   Objective: Vitals:   06/04/23 0832 06/04/23 0908 06/04/23 1059 06/04/23 1401  BP:  Pulse: 80 79    Resp: 18 20  18   Temp: 97.7 F (36.5 C)   (!)  97.5 F (36.4 C)  TempSrc: Oral   Oral  SpO2: 98% 98% 95%   Weight:      Height:        Intake/Output Summary (Last 24 hours) at 06/04/2023 1538 Last data filed at 06/04/2023 1444 Gross per 24 hour  Intake 240 ml  Output 1250 ml  Net -1010 ml   Filed Weights   05/29/2023 1435 06/03/23 0227 06/04/23 0600  Weight: 52.2 kg 51.7 kg 58.3 kg    Examination:  General exam: Appears calm and comfortable. Respiratory system: Decreased air entry with wheezing. Cardiovascular system: S1 & S2 heard Gastrointestinal system: Abdomen is soft and nontender.  Central nervous system: Alert and oriented.  Patient moves all extremities.   Extremities: No leg edema.  Data Reviewed: I have personally reviewed following labs and imaging studies  CBC: Recent Labs  Lab 06/22/2023 1456 06/03/23 0054 06/04/23 0100  WBC 13.9* 6.8 18.1*  NEUTROABS  --   --  15.7*  HGB 11.3* 10.1* 9.1*  HCT 36.2 32.6* 29.5*  MCV 79.0* 79.1* 79.1*  PLT 411* 359 319   Basic Metabolic Panel: Recent Labs  Lab 05/26/2023 1456 06/03/23 0054 06/04/23 0100  NA 130* 133* 135  K 2.9* 4.2  4.0 4.1  CL 86* 92* 95*  CO2 29 26 28   GLUCOSE 111* 212* 147*  BUN 12 11 21   CREATININE 1.08* 1.11* 1.26*  CALCIUM 9.2 8.8* 8.7*  MG  --   --  2.0  PHOS  --   --  4.7*   GFR: Estimated Creatinine Clearance: 28.4 mL/min (A) (by C-G formula based on SCr of 1.26 mg/dL (H)). Liver Function Tests: Recent Labs  Lab 06/12/2023 1456 06/04/23 0100  AST 23  --   ALT 16  --   ALKPHOS 56  --   BILITOT 0.9  --   PROT 6.7  --   ALBUMIN 3.6 3.1*   Recent Labs  Lab 05/31/2023 1456  LIPASE 27   No results for input(s): "AMMONIA" in the last 168 hours. Coagulation Profile: Recent Labs  Lab 06/03/23 0054  INR 1.2   Cardiac Enzymes: No results for input(s): "CKTOTAL", "CKMB", "CKMBINDEX", "TROPONINI" in the last 168 hours. BNP (last 3 results) No results for input(s): "PROBNP" in the last 8760 hours. HbA1C: No results for  input(s): "HGBA1C" in the last 72 hours. CBG: No results for input(s): "GLUCAP" in the last 168 hours. Lipid Profile: No results for input(s): "CHOL", "HDL", "LDLCALC", "TRIG", "CHOLHDL", "LDLDIRECT" in the last 72 hours. Thyroid Function Tests: No results for input(s): "TSH", "T4TOTAL", "FREET4", "T3FREE", "THYROIDAB" in the last 72 hours. Anemia Panel: Recent Labs    06/03/23 0054  VITAMINB12 5,687*  FOLATE 31.3  FERRITIN 47  TIBC 451*  IRON 23*  RETICCTPCT 3.4*   Urine analysis:    Component Value Date/Time   COLORURINE STRAW (A) 06/21/2023 1637   APPEARANCEUR CLEAR 06/13/2023 1637   LABSPEC 1.005 05/30/2023 1637   PHURINE 7.0 06/03/2023 1637   GLUCOSEU NEGATIVE 05/29/2023 1637   HGBUR NEGATIVE 05/27/2023 1637   BILIRUBINUR NEGATIVE 06/11/2023 1637   KETONESUR NEGATIVE 05/25/2023 1637   PROTEINUR 30 (A) 06/10/2023 1637   UROBILINOGEN 0.2 11/11/2010 0557   NITRITE NEGATIVE 06/15/2023 1637   LEUKOCYTESUR NEGATIVE 05/26/2023 1637   Sepsis Labs: @LABRCNTIP (procalcitonin:4,lacticidven:4)  ) Recent Results (from the past 240 hour(s))  SARS Coronavirus 2 by  RT PCR (hospital order, performed in [facility] hospital lab) *cepheid single result test* Anterior Nasal Swab     Status: None   Collection Time: 06/03/23 12:50 AM   Specimen: Anterior Nasal Swab  Result Value Ref Range Status   SARS Coronavirus 2 by RT PCR NEGATIVE NEGATIVE Final    Comment: Performed at [facility] Lab, [address], [place], [place] [address]  Respiratory (~20 pathogens) panel by PCR     Status: None   Collection Time: 06/03/23 12:51 AM   Specimen: Nasopharyngeal Swab; Respiratory  Result Value Ref Range Status   Adenovirus NOT DETECTED NOT DETECTED Final   Coronavirus 229E NOT DETECTED NOT DETECTED Final    Comment: (NOTE) The Coronavirus on the Respiratory Panel, DOES NOT test for the novel  Coronavirus (2019 nCoV)    Coronavirus HKU1 NOT DETECTED NOT DETECTED Final   Coronavirus  NL63 NOT DETECTED NOT DETECTED Final   Coronavirus OC43 NOT DETECTED NOT DETECTED Final   Metapneumovirus NOT DETECTED NOT DETECTED Final   Rhinovirus / Enterovirus NOT DETECTED NOT DETECTED Final   Influenza A NOT DETECTED NOT DETECTED Final   Influenza B NOT DETECTED NOT DETECTED Final   Parainfluenza Virus 1 NOT DETECTED NOT DETECTED Final   Parainfluenza Virus 2 NOT DETECTED NOT DETECTED Final   Parainfluenza Virus 3 NOT DETECTED NOT DETECTED Final   Parainfluenza Virus 4 NOT DETECTED NOT DETECTED Final   Respiratory Syncytial Virus NOT DETECTED NOT DETECTED Final   Bordetella pertussis NOT DETECTED NOT DETECTED Final   Bordetella Parapertussis NOT DETECTED NOT DETECTED Final   Chlamydophila pneumoniae NOT DETECTED NOT DETECTED Final   Mycoplasma pneumoniae NOT DETECTED NOT DETECTED Final    Comment: Performed at [facility], [address], [place], [place] [address]  Culture, blood (Routine X 2) w Reflex to ID Panel     Status: Abnormal (Preliminary result)   Collection Time: 06/03/23 12:54 AM   Specimen: BLOOD LEFT ARM  Result Value Ref Range Status   Specimen Description BLOOD LEFT ARM  Final   Special Requests   Final    BOTTLES DRAWN AEROBIC AND ANAEROBIC Blood Culture adequate volume   Culture  Setup Time   Final    GRAM POSITIVE COCCI ANAEROBIC BOTTLE ONLY CRITICAL RESULT CALLED TO, READ BACK BY AND VERIFIED WITH: PHARMD MADISON OWENS [id number] 1654 BY J RAZZAK, MT    Culture (A)  Final    ENTEROCOCCUS FAECIUM SUSCEPTIBILITIES TO FOLLOW Performed at [facility], [address], [place], [place] [address]    Report Status PENDING  Incomplete  Blood Culture ID Panel (Reflexed)     Status: Abnormal   Collection Time: 06/03/23 12:54 AM  Result Value Ref Range Status   Enterococcus faecalis NOT DETECTED NOT DETECTED Final   Enterococcus Faecium DETECTED (A) NOT DETECTED Final    Comment: CRITICAL RESULT CALLED TO, READ BACK BY AND VERIFIED WITH: PHARMD  MADISON OWEN [id number] 1654 BY J RAZZAK, MT    Listeria monocytogenes NOT DETECTED NOT DETECTED Final   Staphylococcus species NOT DETECTED NOT DETECTED Final   Staphylococcus aureus (BCID) NOT DETECTED NOT DETECTED Final   Staphylococcus epidermidis NOT DETECTED NOT DETECTED Final   Staphylococcus lugdunensis NOT DETECTED NOT DETECTED Final   Streptococcus species NOT DETECTED NOT DETECTED Final   Streptococcus agalactiae NOT DETECTED NOT DETECTED Final   Streptococcus pneumoniae NOT DETECTED NOT DETECTED Final   Streptococcus pyogenes NOT DETECTED NOT DETECTED Final   A.calcoaceticus-baumannii  NOT DETECTED NOT DETECTED Final   Bacteroides fragilis NOT DETECTED NOT DETECTED Final   Enterobacterales NOT DETECTED NOT DETECTED Final   Enterobacter cloacae complex NOT DETECTED NOT DETECTED Final   Escherichia coli NOT DETECTED NOT DETECTED Final   Klebsiella aerogenes NOT DETECTED NOT DETECTED Final   Klebsiella oxytoca NOT DETECTED NOT DETECTED Final   Klebsiella pneumoniae NOT DETECTED NOT DETECTED Final   Proteus species NOT DETECTED NOT DETECTED Final   Salmonella species NOT DETECTED NOT DETECTED Final   Serratia marcescens NOT DETECTED NOT DETECTED Final   Haemophilus influenzae NOT DETECTED NOT DETECTED Final   Neisseria meningitidis NOT DETECTED NOT DETECTED Final   Pseudomonas aeruginosa NOT DETECTED NOT DETECTED Final   Stenotrophomonas maltophilia NOT DETECTED NOT DETECTED Final   Candida albicans NOT DETECTED NOT DETECTED Final   Candida auris NOT DETECTED NOT DETECTED Final   Candida glabrata NOT DETECTED NOT DETECTED Final   Candida krusei NOT DETECTED NOT DETECTED Final   Candida parapsilosis NOT DETECTED NOT DETECTED Final   Candida tropicalis NOT DETECTED NOT DETECTED Final   Cryptococcus neoformans/gattii NOT DETECTED NOT DETECTED Final   Vancomycin resistance NOT DETECTED NOT DETECTED Final    Comment: Performed at [facility], [address] [place],  [place] [address]  Culture, blood (Routine X 2) w Reflex to ID Panel     Status: Abnormal (Preliminary result)   Collection Time: 06/03/23 12:56 AM   Specimen: BLOOD LEFT HAND  Result Value Ref Range Status   Specimen Description BLOOD LEFT HAND  Final   Special Requests   Final    BOTTLES DRAWN AEROBIC AND ANAEROBIC Blood Culture adequate volume   Culture  Setup Time   Final    GRAM POSITIVE COCCI ANAEROBIC BOTTLE ONLY Performed at [facility], [address], [place], [place] [address]    Culture ENTEROCOCCUS FAECIUM (A)  Final   Report Status PENDING  Incomplete  Culture, blood (Routine X 2) w Reflex to ID Panel     Status: None (Preliminary result)   Collection Time: 06/04/23  7:46 AM   Specimen: BLOOD RIGHT HAND  Result Value Ref Range Status   Specimen Description BLOOD RIGHT HAND  Final   Special Requests   Final    BOTTLES DRAWN AEROBIC AND ANAEROBIC Blood Culture adequate volume   Culture   Final    NO GROWTH < 12 HOURS Performed at [facility], [address], [place], [place] [address]    Report Status PENDING  Incomplete  Culture, blood (Routine X 2) w Reflex to ID Panel     Status: None (Preliminary result)   Collection Time: 06/04/23  7:48 AM   Specimen: BLOOD LEFT HAND  Result Value Ref Range Status   Specimen Description BLOOD LEFT HAND  Final   Special Requests   Final    BOTTLES DRAWN AEROBIC AND ANAEROBIC Blood Culture results may not be optimal due to an excessive volume of blood received in culture bottles   Culture   Final    NO GROWTH < 12 HOURS Performed at [facility], [address] [place], [place] [address]    Report Status PENDING  Incomplete         Radiology Studies: ECHOCARDIOGRAM COMPLETE  Result Date: 06/04/2023    ECHOCARDIOGRAM REPORT   Patient Name:   THANDI MAPHIS Date of Exam: 06/04/2023 Medical Rec #:  [id number]  Height:       60.0 in Accession #:    [id number]         Weight:       128.5 lb Date of  Birth:  1940-11-03           BSA:          1.547 m Patient Age:    81 years           BP:           150/84 mmHg Patient Gender: F                  HR:           80 bpm. Exam Location:  Inpatient Procedure: 2D Echo, Color Doppler and Cardiac Doppler Indications:    Bactermia  History:        Patient has prior history of Echocardiogram examinations, most                 recent 07/20/2016. CHF, COPD, Arrythmias:Paroxysmal Atrial                 Tachycardia, Signs/Symptoms:Shortness of Breath; Risk                 Factors:Dyslipidemia and Current Smoker. Rheumatic Fever.  Sonographer:    Raeford Razor RDCS Referring Phys: 6 Stephane Niemann I Brizeyda Holtmeyer IMPRESSIONS  1. Left ventricular ejection fraction, by estimation, is 50 to 55%. The left ventricle has low normal function. The left ventricle has no regional wall motion abnormalities. There is severe asymmetric left ventricular hypertrophy of the inferior segment. Left ventricular diastolic parameters are indeterminate.  2. Right ventricular systolic function is mildly reduced. The right ventricular size is mildly enlarged. There is moderately elevated pulmonary artery systolic pressure.  3. Left atrial size was mildly dilated.  4. Right atrial size was severely dilated.  5. The mitral valve is normal in structure. Mild mitral valve regurgitation. No evidence of mitral stenosis.  6. 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  7. The aortic valve is normal in structure. Aortic valve regurgitation is not visualized. No aortic stenosis is present.  8. The inferior vena cava is dilated in size with <50% respiratory variability, suggesting right atrial pressure of 15 mmHg. FINDINGS  Left Ventricle: Left ventricular ejection fraction, by estimation, is 50 to 55%. The left ventricle has low normal function. The left ventricle has no regional wall motion abnormalities. The left ventricular internal cavity size was normal in size. There is severe  asymmetric left ventricular hypertrophy of the inferior segment. Left ventricular diastolic parameters are indeterminate. Right Ventricle: The right ventricular size is mildly enlarged. No increase in right ventricular wall thickness. Right ventricular systolic function is mildly reduced. There is moderately elevated pulmonary artery systolic pressure. The tricuspid regurgitant velocity is 3.11 m/s, and with an assumed right atrial pressure of 15 mmHg, the estimated right ventricular systolic pressure is 53.7 mmHg. Left Atrium: Left atrial size was mildly dilated. Right Atrium: Right atrial size was severely dilated. Pericardium: There is no evidence of pericardial effusion. Presence of epicardial fat layer. Mitral Valve: The mitral valve is normal in structure. Mild mitral valve regurgitation. No evidence of mitral valve stenosis. Tricuspid Valve: The tricuspid valve is normal in structure. 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 Aortic Valve: The aortic valve is normal in structure. Aortic valve regurgitation is not visualized. No aortic stenosis is present. Aortic valve peak gradient measures  5.0 mmHg. Pulmonic Valve: The pulmonic valve was normal in structure. Pulmonic valve regurgitation is not visualized. No evidence of pulmonic stenosis. Aorta: The aortic root is normal in size and structure. Venous: The inferior vena cava is dilated in size with less than 50% respiratory variability, suggesting right atrial pressure of 15 mmHg. IAS/Shunts: No atrial level shunt detected by color flow Doppler.  LEFT VENTRICLE PLAX 2D LVIDd:         3.50 cm     Diastology LVIDs:         2.60 cm     LV e' medial:    8.59 cm/s LV PW:         1.60 cm     LV E/e' medial:  11.5 LV IVS:        1.20 cm     LV e' lateral:   12.10 cm/s LVOT diam:     1.90 cm     LV E/e' lateral: 8.1 LV SV:         41 LV SV Index:   26 LVOT Area:     2.84 cm  LV Volumes (MOD) LV vol d, MOD A2C: 52.2 ml LV vol d,  MOD A4C: 46.4 ml LV vol s, MOD A2C: 23.5 ml LV vol s, MOD A4C: 23.0 ml LV SV MOD A2C:     28.7 ml LV SV MOD A4C:     46.4 ml LV SV MOD BP:      25.5 ml RIGHT VENTRICLE            IVC RV Basal diam:  2.90 cm    IVC diam: 2.30 cm RV S prime:     8.81 cm/s TAPSE (M-mode): 1.7 cm LEFT ATRIUM             Index        RIGHT ATRIUM           Index LA diam:        3.70 cm 2.39 cm/m   RA Area:     24.40 cm LA Vol (A2C):   63.9 ml 41.31 ml/m  RA Volume:   79.20 ml  51.20 ml/m LA Vol (A4C):   31.7 ml 20.49 ml/m LA Biplane Vol: 47.7 ml 30.84 ml/m  AORTIC VALVE AV Area (Vmax): 2.24 cm AV Vmax:        112.00 cm/s AV Peak Grad:   5.0 mmHg LVOT Vmax:      88.53 cm/s LVOT Vmean:     55.167 cm/s LVOT VTI:       0.143 m  AORTA Ao Root diam: 2.70 cm Ao Asc diam:  3.40 cm MITRAL VALVE               TRICUSPID VALVE MV Area (PHT): 4.31 cm    TR Peak grad:   38.7 mmHg MV Decel Time: 176 msec    TR Vmax:        311.00 cm/s MR Peak grad: 73.3 mmHg MR Vmax:      428.00 cm/s  SHUNTS MV E velocity: 98.50 cm/s  Systemic VTI:  0.14 m                            Systemic Diam: 1.90 cm Kardie Tobb DO Electronically signed by Thomasene Ripple DO Signature Date/Time: 06/04/2023/2:01:46 PM    Final    DG Abd 1 View  Result Date: 06/03/2023 CLINICAL DATA:  [id number] Abdominal pain [phone]  EXAM: ABDOMEN - 1 VIEW COMPARISON:  None Available. FINDINGS: Two pacemaker leads terminate over the heart. No disproportionately dilated small bowel loops. Moderate to large volume of gas and stool throughout the colon. No evidence of pneumatosis or pneumoperitoneum. Small left pleural effusion. Marked lumbar degenerative disc disease. No radiopaque nephrolithiasis. IMPRESSION: 1. Nonobstructive bowel gas pattern. Moderate to large volume of gas and stool throughout the colon, which may indicate constipation. 2. Small left pleural effusion. Electronically Signed   By: Delbert Phenix M.D.   On: 06/03/2023 15:12   DG Chest 2 View  Result Date: 06-20-2023 CLINICAL  DATA:  Shortness of breath. EXAM: CHEST - 2 VIEW COMPARISON:  Radiographs 05/29/2023 and 05/13/2023.  CT 09/22/2021. FINDINGS: 1538 hours. Two views obtained. The heart size and mediastinal contours are stable with aortic atherosclerosis. Left subclavian pacemaker leads project over the right ventricular apex and appear unchanged. Stable chronic pleural thickening at the left costophrenic angle, likely related to old left-sided rib fractures. The lungs appear clear. No evidence of significant pleural effusion or pneumothorax. Multiple thoracolumbar compression deformities are grossly unchanged. IMPRESSION: No evidence of acute cardiopulmonary process. 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 Electronically Signed   By: Carey Bullocks M.D.   On: 06/20/2023 16:23        Scheduled Meds:  apixaban  2.5 mg Oral BID   aspirin EC  81 mg Oral Daily   carvedilol  3.125 mg Oral BID WC   diltiazem  120 mg Oral Daily   fluticasone furoate-vilanterol  1 puff Inhalation Daily   And   umeclidinium bromide  1 puff Inhalation Daily   furosemide  40 mg Oral Daily   montelukast  10 mg Oral QHS   pantoprazole  40 mg Oral Daily   sodium chloride flush  3 mL Intravenous Q12H   Continuous Infusions:  [START ON 06/05/2023] vancomycin       LOS: 2 days    Time spent: 35 minutes    Berton Mount, MD  [facility] Pager #: [phone] 7PM-7AM contact night coverage as above

## 2023-06-04 NOTE — Progress Notes (Signed)
Echocardiogram 2D Echocardiogram has been performed.  Irish Lack, RDCS 06/04/2023, 1:35 PM

## 2023-06-04 NOTE — Progress Notes (Signed)
[facility] night cross cover note:   I was notified by RN that the patient is complaining of her chronic back pain, and that it is refractory to existing order for prn acetaminophen.  I subsequently placed order for Percocet 5/325 mg p.o. every 6 hours as needed for pain.     Newton Pigg, DO Hospitalist

## 2023-06-05 ENCOUNTER — Inpatient Hospital Stay (HOSPITAL_COMMUNITY): Payer: No Typology Code available for payment source

## 2023-06-05 DIAGNOSIS — B952 Enterococcus as the cause of diseases classified elsewhere: Secondary | ICD-10-CM | POA: Diagnosis not present

## 2023-06-05 DIAGNOSIS — T827XXA Infection and inflammatory reaction due to other cardiac and vascular devices, implants and grafts, initial encounter: Secondary | ICD-10-CM | POA: Diagnosis not present

## 2023-06-05 DIAGNOSIS — R7881 Bacteremia: Secondary | ICD-10-CM

## 2023-06-05 DIAGNOSIS — I480 Paroxysmal atrial fibrillation: Secondary | ICD-10-CM

## 2023-06-05 DIAGNOSIS — R1032 Left lower quadrant pain: Secondary | ICD-10-CM

## 2023-06-05 DIAGNOSIS — M7989 Other specified soft tissue disorders: Secondary | ICD-10-CM

## 2023-06-05 DIAGNOSIS — L989 Disorder of the skin and subcutaneous tissue, unspecified: Secondary | ICD-10-CM

## 2023-06-05 MED ORDER — SODIUM CHLORIDE 0.9 % IV SOLN
2.0000 g | Freq: Four times a day (QID) | INTRAVENOUS | Status: DC
  Administered 2023-06-05 – 2023-06-17 (×47): 2 g via INTRAVENOUS
  Filled 2023-06-05 (×57): qty 2000

## 2023-06-05 MED ORDER — CARVEDILOL 6.25 MG PO TABS: 6.2500 mg | ORAL_TABLET | Freq: Two times a day (BID) | ORAL | Status: DC

## 2023-06-05 MED ORDER — OXYCODONE-ACETAMINOPHEN 5-325 MG PO TABS
1.0000 | ORAL_TABLET | ORAL | Status: DC | PRN
  Administered 2023-06-05: 1 via ORAL
  Filled 2023-06-05: qty 1

## 2023-06-05 MED ORDER — CARVEDILOL 3.125 MG PO TABS: 3.1250 mg | ORAL_TABLET | Freq: Once | ORAL | Status: DC

## 2023-06-05 MED ORDER — SODIUM CHLORIDE 0.9 % IV SOLN
2.0000 g | Freq: Two times a day (BID) | INTRAVENOUS | Status: DC
  Administered 2023-06-05 – 2023-06-09 (×8): 2 g via INTRAVENOUS
  Filled 2023-06-05 (×8): qty 20

## 2023-06-05 MED ORDER — AMOXICILLIN 500 MG PO CAPS
500.0000 mg | ORAL_CAPSULE | Freq: Once | ORAL | Status: AC
  Administered 2023-06-05: 500 mg via ORAL
  Filled 2023-06-05: qty 1

## 2023-06-05 MED ORDER — EPINEPHRINE 0.3 MG/0.3ML IJ SOAJ
0.3000 mg | Freq: Once | INTRAMUSCULAR | Status: DC | PRN
  Filled 2023-06-05 (×2): qty 0.3

## 2023-06-05 MED ORDER — DIPHENHYDRAMINE HCL 50 MG/ML IJ SOLN
25.0000 mg | Freq: Once | INTRAMUSCULAR | Status: AC | PRN
  Administered 2023-06-06: 25 mg via INTRAVENOUS
  Filled 2023-06-05: qty 1

## 2023-06-05 MED ORDER — CARVEDILOL 6.25 MG PO TABS
6.2500 mg | ORAL_TABLET | Freq: Two times a day (BID) | ORAL | Status: DC
  Administered 2023-06-05: 3.125 mg via ORAL
  Administered 2023-06-06 – 2023-06-16 (×21): 6.25 mg via ORAL
  Filled 2023-06-05 (×24): qty 1

## 2023-06-05 MED ORDER — MENTHOL 3 MG MT LOZG
1.0000 | LOZENGE | OROMUCOSAL | Status: DC | PRN
  Administered 2023-06-05 – 2023-06-08 (×6): 3 mg via ORAL
  Filled 2023-06-05 (×2): qty 9

## 2023-06-05 NOTE — Care Management Important Message (Signed)
Important Message  Patient Details  Name: [name] MRN: [id number] Date of Birth: 1941-08-02   Medicare Important Message Given:  Yes     Renie Ora 06/05/2023, 9:33 AM

## 2023-06-05 NOTE — Consult Note (Addendum)
[facility]    Date of Admission:  06/02/2023      Total days of antibiotics 3   Vancomycin              Reason for Consult: E faecium bacteremia    Referring Provider: Connye Burkitt Primary Care Provider: Leane Call, PA-C   Assessment: [name] is a 82 y.o. female admitted with hypertensive urgency, SOB and increased oxygen use with waxing/waning abdominal pain and acutely worsened back pain after back injection. Was planning another injection 8/9 but presented with hypertensive urgency to clinic  Enterococcus Faecium Bacteremia 2/2 sites -  -Source not clear - could be from recent back infection causing vertebral infection vs PPM device infection. She describes 2-3 week history of worsened SOB and waxing/waning abdominal pain with some dysuria.  -POC U/A 3 weeks back was unremarkable for bacterial infection, and here in hospital with recent admission appears bland. -Agree with TEE for investigating source of infection with cardiac device in place    A/C Severe Lower Back Pain, Lumbar Facet Arthopathy -  -planning facet joint ablations. Imaging found new L1 compression fracture - discussed balloon kyphoplasty -MRI T and L spine w/ and w/o contrast, though may be early in presentation w/in a week. May get more value with serial imaging to look for progressive changes that can be delayed on radiography.  -I am not sure she actually had injection on 8/9 d/t her BP and was redirected to ER.    Abdominal Pain -  -XRay with some moderate stool burden / constipation.   SOB -  -XRay clear from any acute process. Chronic left sided rib fractures and thoracolumbar compression deformities.    R Anterior Foot Skin Lesion -  -bleeding intermittently. Poor healing.  -defer to outpatient dermatology w/ h/o skin cancer in the past    Augmentin allergy -  -she got short of breath and had some peeling skin per her husband. This was around  05/13/2023. She felt she had chest tightness, nose was very swollen. Has tolerated amoxicillin alone in the past a lot and used for device related dental prophylaxis.  -Amoxicillin challenge today      Plan: Unable to do MRI with device in as it is not reliably compatible - will check NCCT of L- and T-spine.  ESR / CRP in AM  Agree with TEE to evaluate cardiac device with enterococcal bacteremia  Consider A/P CT scan given waxing/waning abdominal pain    Principal Problem:   Bacteremia due to Enterococcus Active Problems:   A-fib (HCC)   COPD with acute exacerbation (HCC)   Hypertensive emergency   Anemia   Leg swelling    apixaban  2.5 mg Oral BID   aspirin EC  81 mg Oral Daily   carvedilol  3.125 mg Oral BID WC   diltiazem  120 mg Oral Daily   fluticasone furoate-vilanterol  1 puff Inhalation Daily   And   umeclidinium bromide  1 puff Inhalation Daily   furosemide  40 mg Oral Daily   montelukast  10 mg Oral QHS   pantoprazole  40 mg Oral Daily   sodium chloride flush  3 mL Intravenous Q12H    HPI: [name] is a 82 y.o. female admitted to the hospital with abdominal pain and back pain.   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 This was placed in Sept 2022. Always has some discomfort around the pocket.   Recent Hx:  2-3 weeks of increased SOB to where she has required wearing oxygen during the day time occasionally. Had an infection on nose at the end of July that she had an allergic reaction to Augmentin with severe swelling of the nose and flaking rash. She has tolerated amoxicillin alone in the past w/o concern. Known compression fracture L1 - working with pain medicine team where she had an injection under fluoroscopy on 06/02/2023.   Was sent to the hospital with hypertensive emergency after seen in  ambulatory office for her back pain. She required short course of oxygen 2 LPM but no hypoxia on monitoring. With leukocytosis on presentation blood cultures were collected with enterococcus faecium growing in both sets. TTE obtained and unremarkable with normal valve structure noted.   Has history of hernia surgery in the past. She has had abdominal pain off and on for a few weeks. Describes some burning with urination.  Described increased shortness of breath for 2 or 3 weeks during the day requiring intermittent oxygen use. No chest pains, no leg swelling, no orthopnea. At the time of our evaluation she does describe some chest discomfort / panic.   RLE anterior foot wound that has had bleeding off and on.   Review of Systems: Review of Systems  Constitutional:  Positive for malaise/fatigue. Negative for chills.  Respiratory:  Positive for cough and shortness of breath.   Cardiovascular:  Negative for chest pain, palpitations and leg swelling.  Gastrointestinal:  Positive for abdominal pain.  Genitourinary:  Positive for dysuria.  Musculoskeletal:  Positive for back pain.    Past Medical History:  Diagnosis Date   Allergy    Anxiety    Arthritis    Asthma    Bronchitis    CAD (coronary artery disease)    Cancer (HCC)    skin cancer right arm   Carotid artery occlusion    Cataracts, bilateral    CHF (congestive heart failure) (HCC)    Chronic airway obstruction, not elsewhere classified    COPD (chronic obstructive pulmonary disease) (HCC)    Dysphagia, unspecified(787.20)    Generalized convulsive epilepsy without mention of intractable epilepsy    GERD (gastroesophageal reflux disease)    Headache    Hearing impaired    Hyperlipidemia    Hypersomnia with sleep apnea, unspecified    Osteoporosis    Oxygen desaturation during sleep    PRN;02 liters   Paroxysmal atrial tachycardia    PONV (postoperative nausea and vomiting)    " I had problems where my heart starts  speeding and fluttering; it goes away by itself ."   Pure hypercholesterolemia    Reflux    Rheumatic fever    as a child   Seizures (HCC)    Shortness of breath 03/11/2010   while laying still   Wears glasses    Wears hearing aids     Social History   Tobacco Use   Smoking status: Every Day    Current packs/day: 2.00    Average packs/day: 2.0 packs/day for 30.0 years (60.0 ttl pk-yrs)    Types: Cigarettes   Smokeless tobacco: Never   Tobacco comments:    Down to 1-1.5 packs per day 06/30/16  Vaping Use   Vaping status: Never Used  Substance Use Topics   Alcohol use: No  Alcohol/week: 0.0 standard drinks of alcohol   Drug use: No    Family History  Problem Relation Age of Onset   Heart disease Mother        CHF   Hyperlipidemia Mother    Hypertension Mother    Heart attack Mother    Deep vein thrombosis Mother    AAA (abdominal aortic aneurysm) Mother    Heart disease Father    Hyperlipidemia Father    Hypertension Father    Heart attack Father    Asthma Father    Cancer Sister    Hyperlipidemia Sister    Hypertension Sister    Cancer Brother    Hyperlipidemia Brother    Allergies  Allergen Reactions   Augmentin [Amoxicillin-Pot Clavulanate] Shortness Of Breath and Dermatitis    Patient has tolerated amoxicillin as well as cefdinir in the past.  Several times.  Husband/patient feels that her allergy is to the other agent and Augmentin, namely clavulanic acid   Codeine Shortness Of Breath   Pravastatin Other (See Comments)    Give muscle aches and weakness    Sulfa Antibiotics    Contrast Media [Iodinated Contrast Media] Rash    Itching and rash   Metrizamide Rash    Itching and rash    OBJECTIVE: Blood pressure (!) 152/74, pulse 80, temperature 97.6 F (36.4 C), temperature source Oral, resp. rate 18, height 5' (1.524 m), weight 54.3 kg, SpO2 99%.  Physical Exam Vitals reviewed.  Constitutional:      Appearance: She is well-developed.      Comments: Seated upright in bed with husband at the bedside. Hard of hearing.   HENT:     Mouth/Throat:     Mouth: No oral lesions.     Dentition: Normal dentition. No dental abscesses.     Pharynx: No oropharyngeal exudate.  Cardiovascular:     Rate and Rhythm: Normal rate and regular rhythm.     Heart sounds: Normal heart sounds.  Pulmonary:     Effort: Pulmonary effort is normal.     Breath sounds: Normal breath sounds.  Abdominal:     General: There is no distension.     Palpations: Abdomen is soft.     Tenderness: There is no abdominal tenderness.  Lymphadenopathy:     Cervical: No cervical adenopathy.  Skin:    General: Skin is warm and dry.     Findings: No rash.  Neurological:     Mental Status: She is alert and oriented to person, place, and time.  Psychiatric:        Judgment: Judgment normal.     Lab Results Lab Results  Component Value Date   WBC 18.1 (H) 06/04/2023   HGB 9.1 (L) 06/04/2023   HCT 29.5 (L) 06/04/2023   MCV 79.1 (L) 06/04/2023   PLT 319 06/04/2023    Lab Results  Component Value Date   CREATININE 1.26 (H) 06/04/2023   BUN 21 06/04/2023   NA 135 06/04/2023   K 4.1 06/04/2023   CL 95 (L) 06/04/2023   CO2 28 06/04/2023    Lab Results  Component Value Date   ALT 16 06/02/2023   AST 23 06/02/2023   ALKPHOS 56 06/02/2023   BILITOT 0.9 06/02/2023     Microbiology: Recent Results (from the past 240 hour(s))  SARS Coronavirus 2 by RT PCR (hospital order, performed in [facility] hospital lab) *cepheid single result test* Anterior Nasal Swab     Status: None   Collection Time:  06/03/23 12:50 AM   Specimen: Anterior Nasal Swab  Result Value Ref Range Status   SARS Coronavirus 2 by RT PCR NEGATIVE NEGATIVE Final    Comment: Performed at [facility], [address] [place], [place] [address]  Respiratory (~20 pathogens) panel by PCR     Status: None   Collection Time: 06/03/23 12:51 AM   Specimen: Nasopharyngeal Swab; Respiratory   Result Value Ref Range Status   Adenovirus NOT DETECTED NOT DETECTED Final   Coronavirus 229E NOT DETECTED NOT DETECTED Final    Comment: (NOTE) The Coronavirus on the Respiratory Panel, DOES NOT test for the novel  Coronavirus (2019 nCoV)    Coronavirus HKU1 NOT DETECTED NOT DETECTED Final   Coronavirus NL63 NOT DETECTED NOT DETECTED Final   Coronavirus OC43 NOT DETECTED NOT DETECTED Final   Metapneumovirus NOT DETECTED NOT DETECTED Final   Rhinovirus / Enterovirus NOT DETECTED NOT DETECTED Final   Influenza A NOT DETECTED NOT DETECTED Final   Influenza B NOT DETECTED NOT DETECTED Final   Parainfluenza Virus 1 NOT DETECTED NOT DETECTED Final   Parainfluenza Virus 2 NOT DETECTED NOT DETECTED Final   Parainfluenza Virus 3 NOT DETECTED NOT DETECTED Final   Parainfluenza Virus 4 NOT DETECTED NOT DETECTED Final   Respiratory Syncytial Virus NOT DETECTED NOT DETECTED Final   Bordetella pertussis NOT DETECTED NOT DETECTED Final   Bordetella Parapertussis NOT DETECTED NOT DETECTED Final   Chlamydophila pneumoniae NOT DETECTED NOT DETECTED Final   Mycoplasma pneumoniae NOT DETECTED NOT DETECTED Final    Comment: Performed at [facility], [address], [place], [place] [address]  Culture, blood (Routine X 2) w Reflex to ID Panel     Status: Abnormal   Collection Time: 06/03/23 12:54 AM   Specimen: BLOOD LEFT ARM  Result Value Ref Range Status   Specimen Description BLOOD LEFT ARM  Final   Special Requests   Final    BOTTLES DRAWN AEROBIC AND ANAEROBIC Blood Culture adequate volume   Culture  Setup Time   Final    GRAM POSITIVE COCCI ANAEROBIC BOTTLE ONLY CRITICAL RESULT CALLED TO, READ BACK BY AND VERIFIED WITH: PHARMD MADISON Barry Dienes [phone] 1654 BY Berline Chough, [place] Performed at [facility], [address], [place], [place] [address]    Culture ENTEROCOCCUS FAECIUM (A)  Final   Report Status 06/05/2023 FINAL  Final   Organism ID, Bacteria ENTEROCOCCUS FAECIUM  Final       Susceptibility   Enterococcus faecium - MIC*    AMPICILLIN <=2 SENSITIVE Sensitive     VANCOMYCIN <=0.5 SENSITIVE Sensitive     GENTAMICIN SYNERGY SENSITIVE Sensitive     * ENTEROCOCCUS FAECIUM  Blood Culture ID Panel (Reflexed)     Status: Abnormal   Collection Time: 06/03/23 12:54 AM  Result Value Ref Range Status   Enterococcus faecalis NOT DETECTED NOT DETECTED Final   Enterococcus Faecium DETECTED (A) NOT DETECTED Final    Comment: CRITICAL RESULT CALLED TO, READ BACK BY AND VERIFIED WITH: PHARMD MADISON OWEN [phone] 1654 BY J RAZZAK, [place]    Listeria monocytogenes NOT DETECTED NOT DETECTED Final   Staphylococcus species NOT DETECTED NOT DETECTED Final   Staphylococcus aureus (BCID) NOT DETECTED NOT DETECTED Final   Staphylococcus epidermidis NOT DETECTED NOT DETECTED Final   Staphylococcus lugdunensis NOT DETECTED NOT DETECTED Final   Streptococcus species NOT DETECTED NOT DETECTED Final   Streptococcus agalactiae NOT DETECTED NOT DETECTED Final   Streptococcus pneumoniae NOT  DETECTED NOT DETECTED Final   Streptococcus pyogenes NOT DETECTED NOT DETECTED Final   A.calcoaceticus-baumannii NOT DETECTED NOT DETECTED Final   Bacteroides fragilis NOT DETECTED NOT DETECTED Final   Enterobacterales NOT DETECTED NOT DETECTED Final   Enterobacter cloacae complex NOT DETECTED NOT DETECTED Final   Escherichia coli NOT DETECTED NOT DETECTED Final   Klebsiella aerogenes NOT DETECTED NOT DETECTED Final   Klebsiella oxytoca NOT DETECTED NOT DETECTED Final   Klebsiella pneumoniae NOT DETECTED NOT DETECTED Final   Proteus species NOT DETECTED NOT DETECTED Final   Salmonella species NOT DETECTED NOT DETECTED Final   Serratia marcescens NOT DETECTED NOT DETECTED Final   Haemophilus influenzae NOT DETECTED NOT DETECTED Final   Neisseria meningitidis NOT DETECTED NOT DETECTED Final   Pseudomonas aeruginosa NOT DETECTED NOT DETECTED Final   Stenotrophomonas maltophilia NOT DETECTED NOT DETECTED  Final   Candida albicans NOT DETECTED NOT DETECTED Final   Candida auris NOT DETECTED NOT DETECTED Final   Candida glabrata NOT DETECTED NOT DETECTED Final   Candida krusei NOT DETECTED NOT DETECTED Final   Candida parapsilosis NOT DETECTED NOT DETECTED Final   Candida tropicalis NOT DETECTED NOT DETECTED Final   Cryptococcus neoformans/gattii NOT DETECTED NOT DETECTED Final   Vancomycin resistance NOT DETECTED NOT DETECTED Final    Comment: Performed at [facility], [address], [place], [place] [address]  Culture, blood (Routine X 2) w Reflex to ID Panel     Status: Abnormal   Collection Time: 06/03/23 12:56 AM   Specimen: BLOOD LEFT HAND  Result Value Ref Range Status   Specimen Description BLOOD LEFT HAND  Final   Special Requests   Final    BOTTLES DRAWN AEROBIC AND ANAEROBIC Blood Culture adequate volume   Culture  Setup Time GRAM POSITIVE COCCI ANAEROBIC BOTTLE ONLY   Final   Culture (A)  Final    ENTEROCOCCUS FAECIUM SUSCEPTIBILITIES PERFORMED ON PREVIOUS CULTURE WITHIN THE LAST 5 DAYS. Performed at [facility], [address], [place], [place] [address]    Report Status 06/05/2023 FINAL  Final  Culture, blood (Routine X 2) w Reflex to ID Panel     Status: None (Preliminary result)   Collection Time: 06/04/23  7:46 AM   Specimen: BLOOD RIGHT HAND  Result Value Ref Range Status   Specimen Description BLOOD RIGHT HAND  Final   Special Requests   Final    BOTTLES DRAWN AEROBIC AND ANAEROBIC Blood Culture adequate volume   Culture   Final    NO GROWTH < 24 HOURS Performed at [facility], [address] [place], [place] [address]    Report Status PENDING  Incomplete  Culture, blood (Routine X 2) w Reflex to ID Panel     Status: None (Preliminary result)   Collection Time: 06/04/23  7:48 AM   Specimen: BLOOD LEFT HAND  Result Value Ref Range Status   Specimen Description BLOOD LEFT HAND  Final   Special Requests   Final    BOTTLES DRAWN AEROBIC  AND ANAEROBIC Blood Culture results may not be optimal due to an excessive volume of blood received in culture bottles   Culture   Final    NO GROWTH < 24 HOURS Performed at [facility], [address], [place], [place] [address]    Report Status PENDING  Incomplete   40 minutes spent in care of this patient.  Rexene Alberts, MSN, NP-C [facility]  [email or web address] Pager: [phone] Office: [phone] [facility] Main Line: [phone] *Secure Chat Communication Welcome      INFECTIOUS DISEASE ATTENDING ADDENDUM:   This patient has been seen and discussed with NP, Rexene Alberts. I have reviewed the pertinent past medical, surgical, family, social histories, ROS, medications, allergies and personally reviewed laboratory and radiographic data. I have independently examined the patient. Please see the NP's note for complete details. I concur with her findings with the following additions/corrections:   NOTE I already made one attestation but it appears to have disappeared.  82 year old lady with pacemaker and dependence upon PM.   Admitted with sepsis and found to have Enterococcus faecium bacteremia.  She has wounds on her LE and on left has lesion that has not been biopsied or even seen by Dermatology  She has had low back pain and found to have a pathological fracture at S1. She has had injections and we are of course concerned about the possibility of undiagnosed discitis vertebral osteomyelitis    Did well with her amoxicillin challenge.  She is being changed over to high-dose ampicillin with ceftriaxone for dual beta-lactam therapy anticipate she will need 6 weeks of treatment regardless of the TEE findings that the TEE findings are critical to our informed decision making with regards to her device extraction  CT of T and L spine pending.  I have personally spent 82 minutes involved in  face-to-face and non-face-to-face activities for this patient on the day of the visit. Professional time spent includes the following activities: Preparing to see the patient (review of tests), Obtaining and/or reviewing separately obtained history (admission/discharge record), Performing a medically appropriate examination and/or evaluation , Ordering medications/tests/procedures, referring and communicating with other health care professionals, Documenting clinical information in the EMR, Independently interpreting results (not separately reported), Communicating results to the patient/family/caregiver, Counseling and educating the patient/family/caregiver and Care coordination (not separately reported).

## 2023-06-05 NOTE — Plan of Care (Signed)

## 2023-06-05 NOTE — Consult Note (Signed)
ELECTROPHYSIOLOGY CONSULT NOTE    Patient ID: RAMLA PEPER MRN: [id number], DOB/AGE: 10/25/1940 82 y.o.  Admit date: 06/02/2023 Date of Consult: 06/05/2023  Primary Physician: Leane Call, PA-C Primary Cardiologist: None  Electrophysiologist: New   Referring Provider: Dr. Dartha Lodge  Patient Profile: [name] is a 82 y.o. 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  HPI:  [name] is a 82 y.o. female with medical history as above.   Pt admitted 8/9 with hypertensive urgency after presenting for CT for work up for ongoing back pain. Noted to have worsening SOB and intermittent daytime O2 need (usually on at night).   Pertinent labs on admission include WBC 13.9, Hgb 11.3, K 2.9, Cr 1.08, BNP 334.  + Crackles and concern for infection so BCx sent prior to starting Ceftriazone/Azithromycin. 8/10 blood cultures with Gram + Enterococcus Faecium.   ID and EP consulted given implanted device.   Today, pt is not feeling well after taking pain medication. Husband states she had "abnormal pathway" in her heart. Pt and Husband state she has had several ablations, including by device info AV nodal ablation. No SOB currently. Back pain has chronic component with injection in the past month.  Repots recent infection around her nose requiring antibiotics and "drainage", also with RLE wound.    Labs Potassium4.1 (08/11 0100) Magnesium  2.0 (08/11 0100) Creatinine, ser  1.26* (08/11 0100) PLT  319 (08/11 0100) HGB  9.1* (08/11 0100) WBC 18.1* (08/11 0100) Troponin I (High Sensitivity)17 (08/09 2045).    Past Medical History:  Diagnosis Date   Allergy    Anxiety    Arthritis    Asthma    Bronchitis    CAD (coronary artery disease)     Cancer (HCC)    skin cancer right arm   Carotid artery occlusion    Cataracts, bilateral    CHF (congestive heart failure) (HCC)    Chronic airway obstruction, not elsewhere classified    COPD (chronic obstructive pulmonary disease) (HCC)    Dysphagia, unspecified(787.20)    Generalized convulsive epilepsy without mention of intractable epilepsy    GERD (gastroesophageal reflux disease)    Headache    Hearing impaired    Hyperlipidemia    Hypersomnia with sleep apnea, unspecified    Osteoporosis    Oxygen desaturation during sleep    PRN;02 liters   Paroxysmal atrial tachycardia    PONV (postoperative nausea and vomiting)    " I had problems where my heart starts speeding and fluttering; it goes away by itself ."   Pure hypercholesterolemia    Reflux    Rheumatic fever    as a child   Seizures (HCC)    Shortness of breath 03/11/2010   while laying still   Wears glasses    Wears hearing aids      Surgical History:  Past Surgical History:  Procedure Laterality Date   ABDOMINAL HYSTERECTOMY     ABLATION     x 3   CAROTID ENDARTERECTOMY  11/11/2010   left with Dacron patch angioplasty   CESAREAN SECTION     2 c-sections   ENDARTERECTOMY Right 07/19/2016   Procedure: ENDARTERECTOMY RIGHT CAROTID ARTERY;  Surgeon: Chuck Hint, MD;  Location: [facility] OR;  Service:  Vascular;  Laterality: Right;   HERNIA REPAIR     PATCH ANGIOPLASTY Right 07/19/2016   Procedure: PATCH ANGIOPLASTY RIGHT CAROTID ARTERY USING HEMASHIELD PLATINUM FINESSE PATCH;  Surgeon: Chuck Hint, MD;  Location: [facility] OR;  Service: Vascular;  Laterality: Right;   SKIN CANCER EXCISION     TONSILLECTOMY AND ADENOIDECTOMY       Medications Prior to Admission  Medication Sig Dispense Refill Last Dose   acetaminophen (TYLENOL) 325 MG tablet Take 650 mg by mouth every 6 (six) hours as needed.   06/02/2023   albuterol (VENTOLIN HFA) 108 (90 Base) MCG/ACT inhaler 2 inhalations every 4-6 hrs 18 g 1 06/02/2023    alendronate (FOSAMAX) 70 MG tablet Take 70 mg by mouth once a week.   06/01/2023   aspirin 81 MG tablet Take 81 mg by mouth 2 (two) times daily.   06/02/2023   ELIQUIS 2.5 MG TABS tablet Take 2.5 mg by mouth 2 (two) times daily.   06/02/2023 at 0830-0900   ferrous sulfate 325 (65 FE) MG tablet Take 325 mg by mouth daily before breakfast.   06/02/2023   Fluticasone-Umeclidin-Vilant (TRELEGY ELLIPTA) 200-62.5-25 MCG/INH AEPB Inhale one dose once daily to prevent cough or wheeze.  Rinse, gargle, and spit after use. 60 each 5 05/31/2023   furosemide (LASIX) 40 MG tablet Take 40 mg by mouth daily.   06/01/2023   hydrOXYzine (ATARAX) 25 MG tablet Take 25 mg by mouth 3 (three) times daily as needed for anxiety.   Unknown   ipratropium-albuterol (DUONEB) 0.5-2.5 (3) MG/3ML SOLN Take by nebulization.   Unknown   LORazepam (ATIVAN) 0.5 MG tablet Take 0.5 mg by mouth daily as needed for anxiety.   Unknown   Magnesium 400 MG TABS Take by mouth in the morning and at bedtime.   06/01/2023   montelukast (SINGULAIR) 10 MG tablet TAKE 1 TABLET BY MOUTH AT BEDTIME 90 tablet 1 06/01/2023   pantoprazole (PROTONIX) 40 MG tablet Take 1 tablet (40 mg total) by mouth daily. 30 tablet 0 Unknown   albuterol (PROVENTIL) (2.5 MG/3ML) 0.083% nebulizer solution Take 2.5 mg by nebulization every 6 (six) hours as needed for wheezing or shortness of breath.   Unknown   doxycycline (VIBRA-TABS) 100 MG tablet Take 100 mg by mouth 2 (two) times daily. (Patient not taking: Reported on 06/02/2023)   Not Taking   ipratropium (ATROVENT) 0.02 % nebulizer solution Take 0.5 mg by nebulization 4 (four) times daily.   Unknown   nystatin cream (MYCOSTATIN) SMARTSIG:Vaginal 3-4 Times Daily (Patient not taking: Reported on 04/14/2021)      predniSONE (STERAPRED UNI-PAK 48 TAB) 5 MG (48) TBPK tablet Take 5 mg by mouth as directed. (Patient not taking: Reported on 06/02/2023)   Not Taking   Spacer/Aero-Holding Chambers (AEROCHAMBER PLUS) inhaler Use as directed with  inhaler. 1 each 2     Inpatient Medications:   apixaban  2.5 mg Oral BID   aspirin EC  81 mg Oral Daily   carvedilol  3.125 mg Oral BID WC   diltiazem  120 mg Oral Daily   fluticasone furoate-vilanterol  1 puff Inhalation Daily   And   umeclidinium bromide  1 puff Inhalation Daily   furosemide  40 mg Oral Daily   montelukast  10 mg Oral QHS   pantoprazole  40 mg Oral Daily   sodium chloride flush  3 mL Intravenous Q12H    Allergies:  Allergies  Allergen Reactions   Augmentin [Amoxicillin-Pot Clavulanate] Shortness Of Breath  and Dermatitis    Patient has tolerated amoxicillin as well as cefdinir in the past.  Several times.  Husband/patient feels that her allergy is to the other agent and Augmentin, namely clavulanic acid   Codeine Shortness Of Breath   Pravastatin Other (See Comments)    Give muscle aches and weakness    Sulfa Antibiotics    Contrast Media [Iodinated Contrast Media] Rash    Itching and rash   Metrizamide Rash    Itching and rash    Family History  Problem Relation Age of Onset   Heart disease Mother        CHF   Hyperlipidemia Mother    Hypertension Mother    Heart attack Mother    Deep vein thrombosis Mother    AAA (abdominal aortic aneurysm) Mother    Heart disease Father    Hyperlipidemia Father    Hypertension Father    Heart attack Father    Asthma Father    Cancer Sister    Hyperlipidemia Sister    Hypertension Sister    Cancer Brother    Hyperlipidemia Brother      Physical Exam: Vitals:   06/05/23 0444 06/05/23 0555 06/05/23 0604 06/05/23 0809  BP: (!) 191/91 (!) 196/92 (!) 152/74   Pulse: 79  80   Resp:      Temp:      TempSrc:      SpO2: 99%  97% 99%  Weight:      Height:        GEN- NAD, A&O x 3, normal affect HEENT: Normocephalic, atraumatic Lungs- CTAB, Normal effort.  Heart- Regular rate and rhythm, No M/G/R.  GI- Soft, NT, ND.  Extremities- No clubbing, cyanosis, or edema   Radiology/Studies: ECHOCARDIOGRAM  COMPLETE  Result Date: 06/04/2023    ECHOCARDIOGRAM REPORT   Patient Name:   RIETTA ELSNER Date of Exam: 06/04/2023 Medical Rec #:  [id number]          Height:       60.0 in Accession #:    [id number]         Weight:       128.5 lb Date of Birth:  1941-02-02           BSA:          1.547 m Patient Age:    81 years           BP:           150/84 mmHg Patient Gender: F                  HR:           80 bpm. Exam Location:  Inpatient Procedure: 2D Echo, Color Doppler and Cardiac Doppler Indications:    Bactermia  History:        Patient has prior history of Echocardiogram examinations, most                 recent 07/20/2016. CHF, COPD, Arrythmias:Paroxysmal Atrial                 Tachycardia, Signs/Symptoms:Shortness of Breath; Risk                 Factors:Dyslipidemia and Current Smoker. Rheumatic Fever.  Sonographer:    Raeford Razor RDCS Referring Phys: [id number] SYLVESTER I OGBATA IMPRESSIONS  1. Left ventricular ejection fraction, by estimation, is 50 to 55%. The left ventricle has low normal function. The left ventricle has no regional  wall motion abnormalities. There is severe asymmetric left ventricular hypertrophy of the inferior segment. Left ventricular diastolic parameters are indeterminate.  2. Right ventricular systolic function is mildly reduced. The right ventricular size is mildly enlarged. There is moderately elevated pulmonary artery systolic pressure.  3. Left atrial size was mildly dilated.  4. Right atrial size was severely dilated.  5. The mitral valve is normal in structure. Mild mitral valve regurgitation. No evidence of mitral stenosis.  6. 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  7. The aortic valve is normal in structure. Aortic valve regurgitation is not visualized. No aortic stenosis is present.  8. The inferior vena cava is dilated in size with <50% respiratory variability, suggesting right atrial pressure of 15 mmHg. FINDINGS  Left Ventricle: Left  ventricular ejection fraction, by estimation, is 50 to 55%. The left ventricle has low normal function. The left ventricle has no regional wall motion abnormalities. The left ventricular internal cavity size was normal in size. There is severe asymmetric left ventricular hypertrophy of the inferior segment. Left ventricular diastolic parameters are indeterminate. Right Ventricle: The right ventricular size is mildly enlarged. No increase in right ventricular wall thickness. Right ventricular systolic function is mildly reduced. There is moderately elevated pulmonary artery systolic pressure. The tricuspid regurgitant velocity is 3.11 m/s, and with an assumed right atrial pressure of 15 mmHg, the estimated right ventricular systolic pressure is 53.7 mmHg. Left Atrium: Left atrial size was mildly dilated. Right Atrium: Right atrial size was severely dilated. Pericardium: There is no evidence of pericardial effusion. Presence of epicardial fat layer. Mitral Valve: The mitral valve is normal in structure. Mild mitral valve regurgitation. No evidence of mitral valve stenosis. Tricuspid Valve: The tricuspid valve is normal in structure. 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 Aortic Valve: The aortic valve is normal in structure. Aortic valve regurgitation is not visualized. No aortic stenosis is present. Aortic valve peak gradient measures 5.0 mmHg. Pulmonic Valve: The pulmonic valve was normal in structure. Pulmonic valve regurgitation is not visualized. No evidence of pulmonic stenosis. Aorta: The aortic root is normal in size and structure. Venous: The inferior vena cava is dilated in size with less than 50% respiratory variability, suggesting right atrial pressure of 15 mmHg. IAS/Shunts: No atrial level shunt detected by color flow Doppler.  LEFT VENTRICLE PLAX 2D LVIDd:         3.50 cm     Diastology LVIDs:         2.60 cm     LV e' medial:    8.59 cm/s LV PW:         1.60 cm      LV E/e' medial:  11.5 LV IVS:        1.20 cm     LV e' lateral:   12.10 cm/s LVOT diam:     1.90 cm     LV E/e' lateral: 8.1 LV SV:         41 LV SV Index:   26 LVOT Area:     2.84 cm  LV Volumes (MOD) LV vol d, MOD A2C: 52.2 ml LV vol d, MOD A4C: 46.4 ml LV vol s, MOD A2C: 23.5 ml LV vol s, MOD A4C: 23.0 ml LV SV MOD A2C:     28.7 ml LV SV MOD A4C:     46.4 ml LV SV MOD BP:      25.5 ml RIGHT VENTRICLE  IVC RV Basal diam:  2.90 cm    IVC diam: 2.30 cm RV S prime:     8.81 cm/s TAPSE (M-mode): 1.7 cm LEFT ATRIUM             Index        RIGHT ATRIUM           Index LA diam:        3.70 cm 2.39 cm/m   RA Area:     24.40 cm LA Vol (A2C):   63.9 ml 41.31 ml/m  RA Volume:   79.20 ml  51.20 ml/m LA Vol (A4C):   31.7 ml 20.49 ml/m LA Biplane Vol: 47.7 ml 30.84 ml/m  AORTIC VALVE AV Area (Vmax): 2.24 cm AV Vmax:        112.00 cm/s AV Peak Grad:   5.0 mmHg LVOT Vmax:      88.53 cm/s LVOT Vmean:     55.167 cm/s LVOT VTI:       0.143 m  AORTA Ao Root diam: 2.70 cm Ao Asc diam:  3.40 cm MITRAL VALVE               TRICUSPID VALVE MV Area (PHT): 4.31 cm    TR Peak grad:   38.7 mmHg MV Decel Time: 176 msec    TR Vmax:        311.00 cm/s MR Peak grad: 73.3 mmHg MR Vmax:      428.00 cm/s  SHUNTS MV E velocity: 98.50 cm/s  Systemic VTI:  0.14 m                            Systemic Diam: 1.90 cm Kardie Tobb DO Electronically signed by Thomasene Ripple DO Signature Date/Time: 06/04/2023/2:01:46 PM    Final    DG Abd 1 View  Result Date: 06/03/2023 CLINICAL DATA:  [id number] Abdominal pain [id number] EXAM: ABDOMEN - 1 VIEW COMPARISON:  None Available. FINDINGS: Two pacemaker leads terminate over the heart. No disproportionately dilated small bowel loops. Moderate to large volume of gas and stool throughout the colon. No evidence of pneumatosis or pneumoperitoneum. Small left pleural effusion. Marked lumbar degenerative disc disease. No radiopaque nephrolithiasis. IMPRESSION: 1. Nonobstructive bowel gas pattern. Moderate to  large volume of gas and stool throughout the colon, which may indicate constipation. 2. Small left pleural effusion. Electronically Signed   By: Delbert Phenix M.D.   On: 06/03/2023 15:12   DG Chest 2 View  Result Date: 06/02/2023 CLINICAL DATA:  Shortness of breath. EXAM: CHEST - 2 VIEW COMPARISON:  Radiographs 05/29/2023 and 05/13/2023.  CT 09/22/2021. FINDINGS: 1538 hours. Two views obtained. The heart size and mediastinal contours are stable with aortic atherosclerosis. Left subclavian pacemaker leads project over the right ventricular apex and appear unchanged. Stable chronic pleural thickening at the left costophrenic angle, likely related to old left-sided rib fractures. The lungs appear clear. No evidence of significant pleural effusion or pneumothorax. Multiple thoracolumbar compression deformities are grossly unchanged. IMPRESSION: No evidence of acute cardiopulmonary process. 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 Electronically Signed   By: Carey Bullocks M.D.   On: 06/02/2023 16:23     EKG:8/9 shows AF with V pacing underlying (personally reviewed)  TELEMETRY: AF/AFL with V pacing underlying 70-80s (personally reviewed)  DEVICE HISTORY: MDT "BiV" with 3830 Left bundle lead and 5076 RV lead. Paced "LV first" by 80 ms.  Implanted 06/2022 s/p AV nodal ablation Dr. Rudolpho Sevin  Assessment/Plan:  Tachy-brady syndrome s/p MDT "BiV" Left bundle lead in LV port, 5076 RV. Implanted in setting of AV nodal ablation 07/13/2022 Will plan TEE to assess for device involvement.  First available is Wednesday.   Permanent AF Continue eliquis for now. Potentially switch to heparin later today pending MRI/CT of back.  S/p multiple "ablations" per pt Appears SVT / pathway ablated 2005 and 2007 and not felt to be AF ablation candidate with COPD.   Back pain With recent injection and was pending CT.  Ideally would get an MRI to r/o osteomyelitis/discitis though  unfortunately 3830 MDT lead in LV port/LBBB position is not currently on label for MRI.  Will start with CT and follow.   COPD On O2. Limits candidacy for advanced procedures.   Bacteremia Has Enterococcus faecium, as yet with unclear source.  Does have RLE, recent facial cellulitis by history, as well as recent back injection.  Dysuria also noted at OV 05/19/2023. UA with protein, rare bacteria, and straw colored here.  Pending imaging of back with acute exacerbation of chronic back pain.  ID following.   EP will follow along. Course pending Source and TEE. Decision on extraction vs observation pending TEE.   For questions or updates, please contact [facility] Please consult [email or web address] for contact info under Cardiology/STEMI.  Dustin Flock, PA-C  06/05/2023 10:25 AM

## 2023-06-05 NOTE — Progress Notes (Signed)
CLEAR 06/23/2023 1637   LABSPEC 1.005 06/03/2023 1637   PHURINE 7.0 06/03/2023 1637   GLUCOSEU NEGATIVE 06/20/2023 1637   HGBUR NEGATIVE 06/19/2023 1637   BILIRUBINUR NEGATIVE 06/19/2023 1637   KETONESUR NEGATIVE 06/20/2023 1637   PROTEINUR 30 (A) 06/15/2023 1637   UROBILINOGEN 0.2 11/11/2010 0557   NITRITE NEGATIVE 06/06/2023 1637   LEUKOCYTESUR NEGATIVE 06/11/2023 1637   Sepsis Labs: @LABRCNTIP (procalcitonin:4,lacticidven:4)  ) Recent Results (from the past 240 hour(s))  SARS Coronavirus 2 by RT PCR (hospital order, performed in [facility] hospital lab) *cepheid single result test* Anterior Nasal Swab     Status: None   Collection Time: 06/03/23 12:50 AM   Specimen: Anterior Nasal Swab  Result Value Ref Range Status   SARS Coronavirus 2 by RT PCR NEGATIVE NEGATIVE Final    Comment: Performed at  [facility], [address], [place], [place] [address]  Respiratory (~20 pathogens) panel by PCR     Status: None   Collection Time: 06/03/23 12:51 AM   Specimen: Nasopharyngeal Swab; Respiratory  Result Value Ref Range Status   Adenovirus NOT DETECTED NOT DETECTED Final   Coronavirus 229E NOT DETECTED NOT DETECTED Final    Comment: (NOTE) The Coronavirus on the Respiratory Panel, DOES NOT test for the novel  Coronavirus (2019 nCoV)    Coronavirus HKU1 NOT DETECTED NOT DETECTED Final   Coronavirus NL63 NOT DETECTED NOT DETECTED Final   Coronavirus OC43 NOT DETECTED NOT DETECTED Final   Metapneumovirus NOT DETECTED NOT DETECTED Final   Rhinovirus / Enterovirus NOT DETECTED NOT DETECTED Final   Influenza A NOT DETECTED NOT DETECTED Final   Influenza B NOT DETECTED NOT DETECTED Final   Parainfluenza Virus 1 NOT DETECTED NOT DETECTED Final   Parainfluenza Virus 2 NOT DETECTED NOT DETECTED Final   Parainfluenza Virus 3 NOT DETECTED NOT DETECTED Final   Parainfluenza Virus 4 NOT DETECTED NOT DETECTED Final   Respiratory Syncytial Virus NOT DETECTED NOT DETECTED Final   Bordetella pertussis NOT DETECTED NOT DETECTED Final   Bordetella Parapertussis NOT DETECTED NOT DETECTED Final   Chlamydophila pneumoniae NOT DETECTED NOT DETECTED Final   Mycoplasma pneumoniae NOT DETECTED NOT DETECTED Final    Comment: Performed at [facility], [address], [place], [place] [address]  Culture, blood (Routine X 2) w Reflex to ID Panel     Status: Abnormal   Collection Time: 06/03/23 12:54 AM   Specimen: BLOOD LEFT ARM  Result Value Ref Range Status   Specimen Description BLOOD LEFT ARM  Final   Special Requests   Final    BOTTLES DRAWN AEROBIC AND ANAEROBIC Blood Culture adequate volume   Culture  Setup Time   Final    GRAM POSITIVE COCCI ANAEROBIC BOTTLE ONLY CRITICAL RESULT CALLED TO, READ BACK BY AND VERIFIED WITH: PHARMD MADISON Barry Dienes [id number] BY Berline Chough, [place] Performed at  [facility], [address], [place], [place] [address]    Culture ENTEROCOCCUS FAECIUM (A)  Final   Report Status 06/05/2023 FINAL  Final   Organism ID, Bacteria ENTEROCOCCUS FAECIUM  Final      Susceptibility   Enterococcus faecium - MIC*    AMPICILLIN <=2 SENSITIVE Sensitive     VANCOMYCIN <=0.5 SENSITIVE Sensitive     GENTAMICIN SYNERGY SENSITIVE Sensitive     * ENTEROCOCCUS FAECIUM  Blood Culture ID Panel (Reflexed)     Status: Abnormal   Collection Time: 06/03/23 12:54 AM  Result Value Ref Range Status  PROGRESS NOTE    [name]  RUE:[id number] DOB: 03-17-1941 DOA: 06/12/2023 PCP: Leane Call, PA-C  Outpatient Specialists:     Brief Narrative:  Patient is an 82 year old female with past medical history significant for COPD on home oxygen, coronary artery disease, carotid artery occlusion, congestive heart failure, hyperlipidemia, paroxysmal atrial tachycardia, rheumatic fever and seizure.  There is also documentation of asthma.  Patient has chronic low back pain.  Patient was admitted with hypertensive emergency (systolic blood pressure of 240 mmHg).  Documented leg edema.  Leg edema has resolved.  Blood pressure slowly improving.  06/03/2023: Patient seen alongside patient's husband.  Patient is eager to be discharged back home.  Patient reports abdominal distention.  Abdominal x-ray revealed gaseous distention with significant amount of stools.  Suspect patient has been on chronic opiates.  Will try enema.  Patient may end up needing peripheral opiate blocker.  Continue to optimize blood pressure.  Likely discharge back home tomorrow.  Will optimize shortness of breath treatment as well.  06/04/2023: Patient seen alongside patient's husband.  No new complaints.  Blood pressure is controlled.  Systolic blood pressure is 130 mmHg.  Blood culture grew Enterococcus faecium 2 out of 2 bottles.  Patient is currently on IV vancomycin.  Follow final blood culture result.  Follow results of echocardiogram.  Consult infectious disease in the morning.  On further questioning, patient and patient husband reported that patient has had back pain, back joint injection in the past and recent infection around the nose that required drainage.  Patient has a pacemaker placed.  MRI, if needed, will need to be planned.  06/05/2023: Patient seen.  Input from infectious disease and EP team is highly appreciated.  For TEE.  For MRI of the thorax/lumbar spine i.e. if pacemaker is compatible continue to  optimize blood pressure control.  Assessment & Plan:   Principal Problem:   Bacteremia due to Enterococcus Active Problems:   A-fib (HCC)   COPD with acute exacerbation (HCC)   Hypertensive emergency   Anemia   Leg swelling   Pacemaker infection (HCC)   Left lower quadrant abdominal pain   Foot lesion   Hypertensive emergency: -On presentation, systolic blood pressure of 240 mmHg. -Currently on Cardizem oral/drip, and Lasix. -Systolic blood pressure has risen up to 170 mmHg today. -Start Coreg 3.125 Mg p.o. twice daily.  Monitor respiratory symptoms closely. -Goal blood pressure should be less than 130/80 mmHg.   -Optimize pain control and alleviate any inconveniences. 06/04/2023: Blood pressure is controlled. 06/05/2023: Continue to optimize blood pressure.  Increase Coreg to 6.25 mg twice daily.    Leg swelling -Resolved. -Continue Lasix. -Monitor renal function and electrolytes.   06/04/2023: Resolved.  Bacteremia: -2 out of blood cultures said to be growing Enterococcus. -ID team will be consulted. -Patient may not have true allergy (according to the pharmacy team). -TTE. -Infectious disease team to advise if there is need to pursue TEE. -Likely, Rocephin and azithromycin will be discontinued. 06/04/2023: Steroid injections to the back previously, as well as, recent infection around the nose that required I&D. 06/05/2023: For TEE.  MRI thoracolumbar spine if pacemaker is compatible.   Anemia Based on record review, seems to be new, will check anemia workup   COPD with acute exacerbation (HCC) -Continue nebulizer treatment. -History of asthma as noted. -Continue Singulair. -Peak flow daily. -Monitor symptoms closely was on Coreg.  A-fib (HCC) -Chronic diagnosis, continue apixaban. -Rate controlled.  Constipation/gaseous abdominal distention: -Try soapsuds enema. -Laxatives. -  PROGRESS NOTE    [name]  RUE:[id number] DOB: 03-17-1941 DOA: 06/12/2023 PCP: Leane Call, PA-C  Outpatient Specialists:     Brief Narrative:  Patient is an 82 year old female with past medical history significant for COPD on home oxygen, coronary artery disease, carotid artery occlusion, congestive heart failure, hyperlipidemia, paroxysmal atrial tachycardia, rheumatic fever and seizure.  There is also documentation of asthma.  Patient has chronic low back pain.  Patient was admitted with hypertensive emergency (systolic blood pressure of 240 mmHg).  Documented leg edema.  Leg edema has resolved.  Blood pressure slowly improving.  06/03/2023: Patient seen alongside patient's husband.  Patient is eager to be discharged back home.  Patient reports abdominal distention.  Abdominal x-ray revealed gaseous distention with significant amount of stools.  Suspect patient has been on chronic opiates.  Will try enema.  Patient may end up needing peripheral opiate blocker.  Continue to optimize blood pressure.  Likely discharge back home tomorrow.  Will optimize shortness of breath treatment as well.  06/04/2023: Patient seen alongside patient's husband.  No new complaints.  Blood pressure is controlled.  Systolic blood pressure is 130 mmHg.  Blood culture grew Enterococcus faecium 2 out of 2 bottles.  Patient is currently on IV vancomycin.  Follow final blood culture result.  Follow results of echocardiogram.  Consult infectious disease in the morning.  On further questioning, patient and patient husband reported that patient has had back pain, back joint injection in the past and recent infection around the nose that required drainage.  Patient has a pacemaker placed.  MRI, if needed, will need to be planned.  06/05/2023: Patient seen.  Input from infectious disease and EP team is highly appreciated.  For TEE.  For MRI of the thorax/lumbar spine i.e. if pacemaker is compatible continue to  optimize blood pressure control.  Assessment & Plan:   Principal Problem:   Bacteremia due to Enterococcus Active Problems:   A-fib (HCC)   COPD with acute exacerbation (HCC)   Hypertensive emergency   Anemia   Leg swelling   Pacemaker infection (HCC)   Left lower quadrant abdominal pain   Foot lesion   Hypertensive emergency: -On presentation, systolic blood pressure of 240 mmHg. -Currently on Cardizem oral/drip, and Lasix. -Systolic blood pressure has risen up to 170 mmHg today. -Start Coreg 3.125 Mg p.o. twice daily.  Monitor respiratory symptoms closely. -Goal blood pressure should be less than 130/80 mmHg.   -Optimize pain control and alleviate any inconveniences. 06/04/2023: Blood pressure is controlled. 06/05/2023: Continue to optimize blood pressure.  Increase Coreg to 6.25 mg twice daily.    Leg swelling -Resolved. -Continue Lasix. -Monitor renal function and electrolytes.   06/04/2023: Resolved.  Bacteremia: -2 out of blood cultures said to be growing Enterococcus. -ID team will be consulted. -Patient may not have true allergy (according to the pharmacy team). -TTE. -Infectious disease team to advise if there is need to pursue TEE. -Likely, Rocephin and azithromycin will be discontinued. 06/04/2023: Steroid injections to the back previously, as well as, recent infection around the nose that required I&D. 06/05/2023: For TEE.  MRI thoracolumbar spine if pacemaker is compatible.   Anemia Based on record review, seems to be new, will check anemia workup   COPD with acute exacerbation (HCC) -Continue nebulizer treatment. -History of asthma as noted. -Continue Singulair. -Peak flow daily. -Monitor symptoms closely was on Coreg.  A-fib (HCC) -Chronic diagnosis, continue apixaban. -Rate controlled.  Constipation/gaseous abdominal distention: -Try soapsuds enema. -Laxatives. -  CLEAR 06/23/2023 1637   LABSPEC 1.005 06/03/2023 1637   PHURINE 7.0 06/03/2023 1637   GLUCOSEU NEGATIVE 06/20/2023 1637   HGBUR NEGATIVE 06/19/2023 1637   BILIRUBINUR NEGATIVE 06/19/2023 1637   KETONESUR NEGATIVE 06/20/2023 1637   PROTEINUR 30 (A) 06/15/2023 1637   UROBILINOGEN 0.2 11/11/2010 0557   NITRITE NEGATIVE 06/06/2023 1637   LEUKOCYTESUR NEGATIVE 06/11/2023 1637   Sepsis Labs: @LABRCNTIP (procalcitonin:4,lacticidven:4)  ) Recent Results (from the past 240 hour(s))  SARS Coronavirus 2 by RT PCR (hospital order, performed in [facility] hospital lab) *cepheid single result test* Anterior Nasal Swab     Status: None   Collection Time: 06/03/23 12:50 AM   Specimen: Anterior Nasal Swab  Result Value Ref Range Status   SARS Coronavirus 2 by RT PCR NEGATIVE NEGATIVE Final    Comment: Performed at  [facility], [address], [place], [place] [address]  Respiratory (~20 pathogens) panel by PCR     Status: None   Collection Time: 06/03/23 12:51 AM   Specimen: Nasopharyngeal Swab; Respiratory  Result Value Ref Range Status   Adenovirus NOT DETECTED NOT DETECTED Final   Coronavirus 229E NOT DETECTED NOT DETECTED Final    Comment: (NOTE) The Coronavirus on the Respiratory Panel, DOES NOT test for the novel  Coronavirus (2019 nCoV)    Coronavirus HKU1 NOT DETECTED NOT DETECTED Final   Coronavirus NL63 NOT DETECTED NOT DETECTED Final   Coronavirus OC43 NOT DETECTED NOT DETECTED Final   Metapneumovirus NOT DETECTED NOT DETECTED Final   Rhinovirus / Enterovirus NOT DETECTED NOT DETECTED Final   Influenza A NOT DETECTED NOT DETECTED Final   Influenza B NOT DETECTED NOT DETECTED Final   Parainfluenza Virus 1 NOT DETECTED NOT DETECTED Final   Parainfluenza Virus 2 NOT DETECTED NOT DETECTED Final   Parainfluenza Virus 3 NOT DETECTED NOT DETECTED Final   Parainfluenza Virus 4 NOT DETECTED NOT DETECTED Final   Respiratory Syncytial Virus NOT DETECTED NOT DETECTED Final   Bordetella pertussis NOT DETECTED NOT DETECTED Final   Bordetella Parapertussis NOT DETECTED NOT DETECTED Final   Chlamydophila pneumoniae NOT DETECTED NOT DETECTED Final   Mycoplasma pneumoniae NOT DETECTED NOT DETECTED Final    Comment: Performed at [facility], [address], [place], [place] [address]  Culture, blood (Routine X 2) w Reflex to ID Panel     Status: Abnormal   Collection Time: 06/03/23 12:54 AM   Specimen: BLOOD LEFT ARM  Result Value Ref Range Status   Specimen Description BLOOD LEFT ARM  Final   Special Requests   Final    BOTTLES DRAWN AEROBIC AND ANAEROBIC Blood Culture adequate volume   Culture  Setup Time   Final    GRAM POSITIVE COCCI ANAEROBIC BOTTLE ONLY CRITICAL RESULT CALLED TO, READ BACK BY AND VERIFIED WITH: PHARMD MADISON Barry Dienes [id number] BY Berline Chough, [place] Performed at  [facility], [address], [place], [place] [address]    Culture ENTEROCOCCUS FAECIUM (A)  Final   Report Status 06/05/2023 FINAL  Final   Organism ID, Bacteria ENTEROCOCCUS FAECIUM  Final      Susceptibility   Enterococcus faecium - MIC*    AMPICILLIN <=2 SENSITIVE Sensitive     VANCOMYCIN <=0.5 SENSITIVE Sensitive     GENTAMICIN SYNERGY SENSITIVE Sensitive     * ENTEROCOCCUS FAECIUM  Blood Culture ID Panel (Reflexed)     Status: Abnormal   Collection Time: 06/03/23 12:54 AM  Result Value Ref Range Status  CLEAR 06/23/2023 1637   LABSPEC 1.005 06/03/2023 1637   PHURINE 7.0 06/03/2023 1637   GLUCOSEU NEGATIVE 06/20/2023 1637   HGBUR NEGATIVE 06/19/2023 1637   BILIRUBINUR NEGATIVE 06/19/2023 1637   KETONESUR NEGATIVE 06/20/2023 1637   PROTEINUR 30 (A) 06/15/2023 1637   UROBILINOGEN 0.2 11/11/2010 0557   NITRITE NEGATIVE 06/06/2023 1637   LEUKOCYTESUR NEGATIVE 06/11/2023 1637   Sepsis Labs: @LABRCNTIP (procalcitonin:4,lacticidven:4)  ) Recent Results (from the past 240 hour(s))  SARS Coronavirus 2 by RT PCR (hospital order, performed in [facility] hospital lab) *cepheid single result test* Anterior Nasal Swab     Status: None   Collection Time: 06/03/23 12:50 AM   Specimen: Anterior Nasal Swab  Result Value Ref Range Status   SARS Coronavirus 2 by RT PCR NEGATIVE NEGATIVE Final    Comment: Performed at  [facility], [address], [place], [place] [address]  Respiratory (~20 pathogens) panel by PCR     Status: None   Collection Time: 06/03/23 12:51 AM   Specimen: Nasopharyngeal Swab; Respiratory  Result Value Ref Range Status   Adenovirus NOT DETECTED NOT DETECTED Final   Coronavirus 229E NOT DETECTED NOT DETECTED Final    Comment: (NOTE) The Coronavirus on the Respiratory Panel, DOES NOT test for the novel  Coronavirus (2019 nCoV)    Coronavirus HKU1 NOT DETECTED NOT DETECTED Final   Coronavirus NL63 NOT DETECTED NOT DETECTED Final   Coronavirus OC43 NOT DETECTED NOT DETECTED Final   Metapneumovirus NOT DETECTED NOT DETECTED Final   Rhinovirus / Enterovirus NOT DETECTED NOT DETECTED Final   Influenza A NOT DETECTED NOT DETECTED Final   Influenza B NOT DETECTED NOT DETECTED Final   Parainfluenza Virus 1 NOT DETECTED NOT DETECTED Final   Parainfluenza Virus 2 NOT DETECTED NOT DETECTED Final   Parainfluenza Virus 3 NOT DETECTED NOT DETECTED Final   Parainfluenza Virus 4 NOT DETECTED NOT DETECTED Final   Respiratory Syncytial Virus NOT DETECTED NOT DETECTED Final   Bordetella pertussis NOT DETECTED NOT DETECTED Final   Bordetella Parapertussis NOT DETECTED NOT DETECTED Final   Chlamydophila pneumoniae NOT DETECTED NOT DETECTED Final   Mycoplasma pneumoniae NOT DETECTED NOT DETECTED Final    Comment: Performed at [facility], [address], [place], [place] [address]  Culture, blood (Routine X 2) w Reflex to ID Panel     Status: Abnormal   Collection Time: 06/03/23 12:54 AM   Specimen: BLOOD LEFT ARM  Result Value Ref Range Status   Specimen Description BLOOD LEFT ARM  Final   Special Requests   Final    BOTTLES DRAWN AEROBIC AND ANAEROBIC Blood Culture adequate volume   Culture  Setup Time   Final    GRAM POSITIVE COCCI ANAEROBIC BOTTLE ONLY CRITICAL RESULT CALLED TO, READ BACK BY AND VERIFIED WITH: PHARMD MADISON Barry Dienes [id number] BY Berline Chough, [place] Performed at  [facility], [address], [place], [place] [address]    Culture ENTEROCOCCUS FAECIUM (A)  Final   Report Status 06/05/2023 FINAL  Final   Organism ID, Bacteria ENTEROCOCCUS FAECIUM  Final      Susceptibility   Enterococcus faecium - MIC*    AMPICILLIN <=2 SENSITIVE Sensitive     VANCOMYCIN <=0.5 SENSITIVE Sensitive     GENTAMICIN SYNERGY SENSITIVE Sensitive     * ENTEROCOCCUS FAECIUM  Blood Culture ID Panel (Reflexed)     Status: Abnormal   Collection Time: 06/03/23 12:54 AM  Result Value Ref Range Status  PROGRESS NOTE    [name]  RUE:[id number] DOB: 03-17-1941 DOA: 06/12/2023 PCP: Leane Call, PA-C  Outpatient Specialists:     Brief Narrative:  Patient is an 82 year old female with past medical history significant for COPD on home oxygen, coronary artery disease, carotid artery occlusion, congestive heart failure, hyperlipidemia, paroxysmal atrial tachycardia, rheumatic fever and seizure.  There is also documentation of asthma.  Patient has chronic low back pain.  Patient was admitted with hypertensive emergency (systolic blood pressure of 240 mmHg).  Documented leg edema.  Leg edema has resolved.  Blood pressure slowly improving.  06/03/2023: Patient seen alongside patient's husband.  Patient is eager to be discharged back home.  Patient reports abdominal distention.  Abdominal x-ray revealed gaseous distention with significant amount of stools.  Suspect patient has been on chronic opiates.  Will try enema.  Patient may end up needing peripheral opiate blocker.  Continue to optimize blood pressure.  Likely discharge back home tomorrow.  Will optimize shortness of breath treatment as well.  06/04/2023: Patient seen alongside patient's husband.  No new complaints.  Blood pressure is controlled.  Systolic blood pressure is 130 mmHg.  Blood culture grew Enterococcus faecium 2 out of 2 bottles.  Patient is currently on IV vancomycin.  Follow final blood culture result.  Follow results of echocardiogram.  Consult infectious disease in the morning.  On further questioning, patient and patient husband reported that patient has had back pain, back joint injection in the past and recent infection around the nose that required drainage.  Patient has a pacemaker placed.  MRI, if needed, will need to be planned.  06/05/2023: Patient seen.  Input from infectious disease and EP team is highly appreciated.  For TEE.  For MRI of the thorax/lumbar spine i.e. if pacemaker is compatible continue to  optimize blood pressure control.  Assessment & Plan:   Principal Problem:   Bacteremia due to Enterococcus Active Problems:   A-fib (HCC)   COPD with acute exacerbation (HCC)   Hypertensive emergency   Anemia   Leg swelling   Pacemaker infection (HCC)   Left lower quadrant abdominal pain   Foot lesion   Hypertensive emergency: -On presentation, systolic blood pressure of 240 mmHg. -Currently on Cardizem oral/drip, and Lasix. -Systolic blood pressure has risen up to 170 mmHg today. -Start Coreg 3.125 Mg p.o. twice daily.  Monitor respiratory symptoms closely. -Goal blood pressure should be less than 130/80 mmHg.   -Optimize pain control and alleviate any inconveniences. 06/04/2023: Blood pressure is controlled. 06/05/2023: Continue to optimize blood pressure.  Increase Coreg to 6.25 mg twice daily.    Leg swelling -Resolved. -Continue Lasix. -Monitor renal function and electrolytes.   06/04/2023: Resolved.  Bacteremia: -2 out of blood cultures said to be growing Enterococcus. -ID team will be consulted. -Patient may not have true allergy (according to the pharmacy team). -TTE. -Infectious disease team to advise if there is need to pursue TEE. -Likely, Rocephin and azithromycin will be discontinued. 06/04/2023: Steroid injections to the back previously, as well as, recent infection around the nose that required I&D. 06/05/2023: For TEE.  MRI thoracolumbar spine if pacemaker is compatible.   Anemia Based on record review, seems to be new, will check anemia workup   COPD with acute exacerbation (HCC) -Continue nebulizer treatment. -History of asthma as noted. -Continue Singulair. -Peak flow daily. -Monitor symptoms closely was on Coreg.  A-fib (HCC) -Chronic diagnosis, continue apixaban. -Rate controlled.  Constipation/gaseous abdominal distention: -Try soapsuds enema. -Laxatives. -  PROGRESS NOTE    [name]  RUE:[id number] DOB: 03-17-1941 DOA: 06/12/2023 PCP: Leane Call, PA-C  Outpatient Specialists:     Brief Narrative:  Patient is an 82 year old female with past medical history significant for COPD on home oxygen, coronary artery disease, carotid artery occlusion, congestive heart failure, hyperlipidemia, paroxysmal atrial tachycardia, rheumatic fever and seizure.  There is also documentation of asthma.  Patient has chronic low back pain.  Patient was admitted with hypertensive emergency (systolic blood pressure of 240 mmHg).  Documented leg edema.  Leg edema has resolved.  Blood pressure slowly improving.  06/03/2023: Patient seen alongside patient's husband.  Patient is eager to be discharged back home.  Patient reports abdominal distention.  Abdominal x-ray revealed gaseous distention with significant amount of stools.  Suspect patient has been on chronic opiates.  Will try enema.  Patient may end up needing peripheral opiate blocker.  Continue to optimize blood pressure.  Likely discharge back home tomorrow.  Will optimize shortness of breath treatment as well.  06/04/2023: Patient seen alongside patient's husband.  No new complaints.  Blood pressure is controlled.  Systolic blood pressure is 130 mmHg.  Blood culture grew Enterococcus faecium 2 out of 2 bottles.  Patient is currently on IV vancomycin.  Follow final blood culture result.  Follow results of echocardiogram.  Consult infectious disease in the morning.  On further questioning, patient and patient husband reported that patient has had back pain, back joint injection in the past and recent infection around the nose that required drainage.  Patient has a pacemaker placed.  MRI, if needed, will need to be planned.  06/05/2023: Patient seen.  Input from infectious disease and EP team is highly appreciated.  For TEE.  For MRI of the thorax/lumbar spine i.e. if pacemaker is compatible continue to  optimize blood pressure control.  Assessment & Plan:   Principal Problem:   Bacteremia due to Enterococcus Active Problems:   A-fib (HCC)   COPD with acute exacerbation (HCC)   Hypertensive emergency   Anemia   Leg swelling   Pacemaker infection (HCC)   Left lower quadrant abdominal pain   Foot lesion   Hypertensive emergency: -On presentation, systolic blood pressure of 240 mmHg. -Currently on Cardizem oral/drip, and Lasix. -Systolic blood pressure has risen up to 170 mmHg today. -Start Coreg 3.125 Mg p.o. twice daily.  Monitor respiratory symptoms closely. -Goal blood pressure should be less than 130/80 mmHg.   -Optimize pain control and alleviate any inconveniences. 06/04/2023: Blood pressure is controlled. 06/05/2023: Continue to optimize blood pressure.  Increase Coreg to 6.25 mg twice daily.    Leg swelling -Resolved. -Continue Lasix. -Monitor renal function and electrolytes.   06/04/2023: Resolved.  Bacteremia: -2 out of blood cultures said to be growing Enterococcus. -ID team will be consulted. -Patient may not have true allergy (according to the pharmacy team). -TTE. -Infectious disease team to advise if there is need to pursue TEE. -Likely, Rocephin and azithromycin will be discontinued. 06/04/2023: Steroid injections to the back previously, as well as, recent infection around the nose that required I&D. 06/05/2023: For TEE.  MRI thoracolumbar spine if pacemaker is compatible.   Anemia Based on record review, seems to be new, will check anemia workup   COPD with acute exacerbation (HCC) -Continue nebulizer treatment. -History of asthma as noted. -Continue Singulair. -Peak flow daily. -Monitor symptoms closely was on Coreg.  A-fib (HCC) -Chronic diagnosis, continue apixaban. -Rate controlled.  Constipation/gaseous abdominal distention: -Try soapsuds enema. -Laxatives. -

## 2023-06-06 ENCOUNTER — Inpatient Hospital Stay (HOSPITAL_COMMUNITY): Payer: No Typology Code available for payment source

## 2023-06-06 DIAGNOSIS — J441 Chronic obstructive pulmonary disease with (acute) exacerbation: Secondary | ICD-10-CM

## 2023-06-06 DIAGNOSIS — B952 Enterococcus as the cause of diseases classified elsewhere: Secondary | ICD-10-CM | POA: Diagnosis not present

## 2023-06-06 DIAGNOSIS — I161 Hypertensive emergency: Secondary | ICD-10-CM | POA: Diagnosis not present

## 2023-06-06 DIAGNOSIS — T827XXD Infection and inflammatory reaction due to other cardiac and vascular devices, implants and grafts, subsequent encounter: Secondary | ICD-10-CM | POA: Diagnosis not present

## 2023-06-06 DIAGNOSIS — R7881 Bacteremia: Secondary | ICD-10-CM | POA: Diagnosis not present

## 2023-06-06 LAB — PHOSPHORUS: Phosphorus: 2.8 mg/dL (ref 2.5–4.6)

## 2023-06-06 LAB — BRAIN NATRIURETIC PEPTIDE: B Natriuretic Peptide: 320.3 pg/mL — ABNORMAL HIGH (ref 0.0–100.0)

## 2023-06-06 LAB — MAGNESIUM: Magnesium: 1.8 mg/dL (ref 1.7–2.4)

## 2023-06-06 LAB — MRSA NEXT GEN BY PCR, NASAL: MRSA by PCR Next Gen: NOT DETECTED

## 2023-06-06 MED ORDER — SODIUM CHLORIDE 0.9 % IV SOLN: INTRAVENOUS | Status: DC

## 2023-06-06 MED ORDER — GUAIFENESIN ER 600 MG PO TB12
600.0000 mg | ORAL_TABLET | Freq: Two times a day (BID) | ORAL | Status: DC
  Administered 2023-06-06 – 2023-06-16 (×23): 600 mg via ORAL
  Filled 2023-06-06 (×24): qty 1

## 2023-06-06 MED ORDER — LEVALBUTEROL HCL 1.25 MG/0.5ML IN NEBU
1.2500 mg | INHALATION_SOLUTION | RESPIRATORY_TRACT | Status: DC | PRN
  Administered 2023-06-13 – 2023-06-17 (×2): 1.25 mg via RESPIRATORY_TRACT
  Filled 2023-06-06 (×3): qty 0.5

## 2023-06-06 MED ORDER — POTASSIUM CHLORIDE CRYS ER 20 MEQ PO TBCR
40.0000 meq | EXTENDED_RELEASE_TABLET | Freq: Once | ORAL | Status: AC
  Administered 2023-06-06: 40 meq via ORAL
  Filled 2023-06-06: qty 2

## 2023-06-06 MED ORDER — IPRATROPIUM BROMIDE 0.02 % IN SOLN
0.5000 mg | Freq: Four times a day (QID) | RESPIRATORY_TRACT | Status: DC
  Administered 2023-06-06 – 2023-06-07 (×5): 0.5 mg via RESPIRATORY_TRACT
  Filled 2023-06-06 (×6): qty 2.5

## 2023-06-06 MED ORDER — ONDANSETRON HCL 4 MG/2ML IJ SOLN
4.0000 mg | Freq: Four times a day (QID) | INTRAMUSCULAR | Status: DC | PRN
  Administered 2023-06-06 – 2023-06-10 (×2): 4 mg via INTRAVENOUS
  Filled 2023-06-06 (×2): qty 2

## 2023-06-06 MED ORDER — HYDRALAZINE HCL 20 MG/ML IJ SOLN
10.0000 mg | INTRAMUSCULAR | Status: DC | PRN
  Filled 2023-06-06: qty 1

## 2023-06-06 MED ORDER — LEVALBUTEROL HCL 0.63 MG/3ML IN NEBU
INHALATION_SOLUTION | RESPIRATORY_TRACT | Status: AC
  Administered 2023-06-06: 1.25 mg
  Filled 2023-06-06: qty 3

## 2023-06-06 MED ORDER — FAMOTIDINE IN NACL 20-0.9 MG/50ML-% IV SOLN
20.0000 mg | Freq: Once | INTRAVENOUS | Status: AC
  Administered 2023-06-06: 20 mg via INTRAVENOUS
  Filled 2023-06-06: qty 50

## 2023-06-06 MED ORDER — HYDRALAZINE HCL 20 MG/ML IJ SOLN
10.0000 mg | INTRAMUSCULAR | Status: DC | PRN
  Administered 2023-06-09: 10 mg via INTRAVENOUS
  Filled 2023-06-06 (×2): qty 1

## 2023-06-06 MED ORDER — METHYLPREDNISOLONE SODIUM SUCC 125 MG IJ SOLR
80.0000 mg | Freq: Once | INTRAMUSCULAR | Status: AC
  Administered 2023-06-06: 80 mg via INTRAVENOUS
  Filled 2023-06-06: qty 2

## 2023-06-06 MED ORDER — METHYLPREDNISOLONE SODIUM SUCC 125 MG IJ SOLR
80.0000 mg | Freq: Two times a day (BID) | INTRAMUSCULAR | Status: DC
  Administered 2023-06-06 – 2023-06-09 (×8): 80 mg via INTRAVENOUS
  Filled 2023-06-06 (×9): qty 2

## 2023-06-06 MED ORDER — MAGNESIUM SULFATE 2 GM/50ML IV SOLN
2.0000 g | Freq: Once | INTRAVENOUS | Status: AC
  Administered 2023-06-06: 2 g via INTRAVENOUS
  Filled 2023-06-06: qty 50

## 2023-06-06 MED ORDER — LEVALBUTEROL HCL 1.25 MG/0.5ML IN NEBU
1.2500 mg | INHALATION_SOLUTION | Freq: Four times a day (QID) | RESPIRATORY_TRACT | Status: DC
  Administered 2023-06-06 – 2023-06-07 (×5): 1.25 mg via RESPIRATORY_TRACT
  Filled 2023-06-06 (×7): qty 0.5

## 2023-06-06 MED ORDER — ORAL CARE MOUTH RINSE: 15.0000 mL | OROMUCOSAL | Status: DC | PRN

## 2023-06-06 NOTE — Progress Notes (Signed)
    [facility] has been requested to perform a transesophageal echocardiogram on 8/14 for Bacteremia.  After careful review of history and examination, the risks and benefits of transesophageal echocardiogram have been explained including risks of esophageal damage, perforation (1:10,000 risk), bleeding, pharyngeal hematoma as well as other potential complications associated with conscious sedation including aspiration, arrhythmia, respiratory failure and death. Alternatives to treatment were discussed, questions were answered. Patient is willing to proceed.   Graciella Freer, PA-C 06/06/2023 2:32 PM

## 2023-06-06 NOTE — Progress Notes (Signed)
PROGRESS NOTE    [name]  OZH:[id number] DOB: 1941/03/12 DOA: 06/02/2023 PCP: Leane Call, PA-C  Outpatient Specialists:     Brief Narrative:  Patient is an 82 year old female with past medical history significant for COPD on home oxygen, coronary artery disease, carotid artery occlusion, congestive heart failure, hyperlipidemia, paroxysmal atrial tachycardia, rheumatic fever and seizure.  There is also documentation of asthma.  Patient has chronic low back pain.  Patient was admitted with hypertensive emergency (systolic blood pressure of 240 mmHg).  Documented leg edema.  Leg edema has resolved.  Blood pressure slowly improving.  06/03/2023: Patient seen alongside patient's husband.  Patient is eager to be discharged back home.  Patient reports abdominal distention.  Abdominal x-ray revealed gaseous distention with significant amount of stools.  Suspect patient has been on chronic opiates.  Will try enema.  Patient may end up needing peripheral opiate blocker.  Continue to optimize blood pressure.  Likely discharge back home tomorrow.  Will optimize shortness of breath treatment as well.  06/04/2023: Patient seen alongside patient's husband.  No new complaints.  Blood pressure is controlled.  Systolic blood pressure is 130 mmHg.  Blood culture grew Enterococcus faecium 2 out of 2 bottles.  Patient is currently on IV vancomycin.  Follow final blood culture result.  Follow results of echocardiogram.  Consult infectious disease in the morning.  On further questioning, patient and patient husband reported that patient has had back pain, back joint injection in the past and recent infection around the nose that required drainage.  Patient has a pacemaker placed.  MRI, if needed, will need to be planned.  06/05/2023: Patient seen.  Input from infectious disease and EP team is highly appreciated.  For TEE.  For MRI of the thorax/lumbar spine i.e. if pacemaker is compatible continue to  optimize blood pressure control.  06/06/2023: For TEE tomorrow.  CT of lumbar and thoracic spine is negative for discitis or osteomyelitis.  ID team plans to proceed with CT of the abdomen and pelvis.  Blood pressure is reasonably controlled.  No new symptoms today.  Assessment & Plan:   Principal Problem:   Bacteremia due to Enterococcus Active Problems:   A-fib (HCC)   COPD with acute exacerbation (HCC)   Hypertensive emergency   Anemia   Leg swelling   Pacemaker infection (HCC)   Left lower quadrant abdominal pain   Foot lesion   Hypertensive emergency: -On presentation, systolic blood pressure of 240 mmHg. -Currently on Cardizem oral/drip, and Lasix. -Systolic blood pressure has risen up to 170 mmHg today. -Start Coreg 3.125 Mg p.o. twice daily.  Monitor respiratory symptoms closely. -Goal blood pressure should be less than 130/80 mmHg.   -Optimize pain control and alleviate any inconveniences. 06/04/2023: Blood pressure is controlled. 06/05/2023: Continue to optimize blood pressure.  Increase Coreg to 6.25 mg twice daily. 06/06/2023: Blood pressure is controlled.  Blood pressures currently 107/67 mmHg.    Leg swelling -Resolved. -Continue Lasix. -Monitor renal function and electrolytes.   06/04/2023: Resolved.  Bacteremia: -2 out of blood cultures said to be growing Enterococcus. -ID team will be consulted. -Patient may not have true allergy (according to the pharmacy team). -TTE. -Infectious disease team to advise if there is need to pursue TEE. -Likely, Rocephin and azithromycin will be discontinued. 06/04/2023: Steroid injections to the back previously, as well as, recent infection around the nose that required I&D. 06/05/2023: For TEE.  MRI thoracolumbar spine if pacemaker is compatible. 06/06/2023: See above documentation.  CT thoracolumbar spine is negative for discitis/osteomyelitis.  For CT abdomen/pelvis as per ID team.   Anemia Based on record review, seems to  be new, will check anemia workup   COPD with acute exacerbation (HCC) -Continue nebulizer treatment. -History of asthma as noted. -Continue Singulair. -Peak flow daily. -Monitor symptoms closely was on Coreg.  A-fib (HCC) -Chronic diagnosis, continue apixaban. -Rate controlled.  Constipation/gaseous abdominal distention: -Try soapsuds enema. -Laxatives. -Patient may need peripheral opiate blocker. -Minimize opiates as much as possible.   DVT prophylaxis: Apixaban. Code Status: Full code. Family Communication: Husband. Disposition Plan: Likely discharge back home tomorrow.  Patient remains inpatient.   Consultants:  None.  Procedures:  None.  Antimicrobials:  IV Rocephin discontinued IV azithromycin discontinued IV vancomycin   Subjective: -No fever or chills. -No shortness of breath.   Objective: Vitals:   06/06/23 0723 06/06/23 1355 06/06/23 1433 06/06/23 1950  BP:   107/67   Pulse:   80   Resp:   16   Temp:   98.3 F (36.8 C)   TempSrc:   Oral   SpO2: 100% 98% 98% 99%  Weight:      Height:        Intake/Output Summary (Last 24 hours) at 06/06/2023 2009 Last data filed at 06/06/2023 1636 Gross per 24 hour  Intake 860.71 ml  Output 800 ml  Net 60.71 ml   Filed Weights   06/04/23 0600 06/05/23 0413 06/06/23 0617  Weight: 58.3 kg 54.3 kg 52.5 kg    Examination:  General exam: Appears calm and comfortable. Respiratory system: Decreased air entry with wheezing. Cardiovascular system: S1 & S2 heard Gastrointestinal system: Abdomen is soft and nontender.  Central nervous system: Alert and oriented.  Patient moves all extremities.   Extremities: No leg edema.  Data Reviewed: I have personally reviewed following labs and imaging studies  CBC: Recent Labs  Lab 06/02/23 1456 06/03/23 0054 06/04/23 0100  WBC 13.9* 6.8 18.1*  NEUTROABS  --   --  15.7*  HGB 11.3* 10.1* 9.1*  HCT 36.2 32.6* 29.5*  MCV 79.0* 79.1* 79.1*  PLT 411* 359 319    Basic Metabolic Panel: Recent Labs  Lab 06/02/23 1456 06/03/23 0054 06/04/23 0100 06/06/23 0042  NA 130* 133* 135 126*  K 2.9* 4.2  4.0 4.1 3.3*  CL 86* 92* 95* 83*  CO2 29 26 28 31   GLUCOSE 111* 212* 147* 124*  BUN 12 11 21 9   CREATININE 1.08* 1.11* 1.26* 0.83  CALCIUM 9.2 8.8* 8.7* 7.7*  MG  --   --  2.0 1.8  PHOS  --   --  4.7* 2.8   GFR: Estimated Creatinine Clearance: 38.8 mL/min (by C-G formula based on SCr of 0.83 mg/dL). Liver Function Tests: Recent Labs  Lab 06/02/23 1456 06/04/23 0100  AST 23  --   ALT 16  --   ALKPHOS 56  --   BILITOT 0.9  --   PROT 6.7  --   ALBUMIN 3.6 3.1*   Recent Labs  Lab 06/02/23 1456  LIPASE 27   No results for input(s): "AMMONIA" in the last 168 hours. Coagulation Profile: Recent Labs  Lab 06/03/23 0054  INR 1.2   Cardiac Enzymes: No results for input(s): "CKTOTAL", "CKMB", "CKMBINDEX", "TROPONINI" in the last 168 hours. BNP (last 3 results) No results for input(s): "PROBNP" in the last 8760 hours. HbA1C: No results for input(s): "HGBA1C" in the last 72 hours. CBG: No results for input(s): "GLUCAP" in the last  168 hours. Lipid Profile: No results for input(s): "CHOL", "HDL", "LDLCALC", "TRIG", "CHOLHDL", "LDLDIRECT" in the last 72 hours. Thyroid Function Tests: No results for input(s): "TSH", "T4TOTAL", "FREET4", "T3FREE", "THYROIDAB" in the last 72 hours. Anemia Panel: No results for input(s): "VITAMINB12", "FOLATE", "FERRITIN", "TIBC", "IRON", "RETICCTPCT" in the last 72 hours.  Urine analysis:    Component Value Date/Time   COLORURINE STRAW (A) 06/02/2023 1637   APPEARANCEUR CLEAR 06/02/2023 1637   LABSPEC 1.005 06/02/2023 1637   PHURINE 7.0 06/02/2023 1637   GLUCOSEU NEGATIVE 06/02/2023 1637   HGBUR NEGATIVE 06/02/2023 1637   BILIRUBINUR NEGATIVE 06/02/2023 1637   KETONESUR NEGATIVE 06/02/2023 1637   PROTEINUR 30 (A) 06/02/2023 1637   UROBILINOGEN 0.2 11/11/2010 0557   NITRITE NEGATIVE 06/02/2023  1637   LEUKOCYTESUR NEGATIVE 06/02/2023 1637   Sepsis Labs: @LABRCNTIP (procalcitonin:4,lacticidven:4)  ) Recent Results (from the past 240 hour(s))  SARS Coronavirus 2 by RT PCR (hospital order, performed in [facility] hospital lab) *cepheid single result test* Anterior Nasal Swab     Status: None   Collection Time: 06/03/23 12:50 AM   Specimen: Anterior Nasal Swab  Result Value Ref Range Status   SARS Coronavirus 2 by RT PCR NEGATIVE NEGATIVE Final    Comment: Performed at [facility], [address], [place], [place] [address]  Respiratory (~20 pathogens) panel by PCR     Status: None   Collection Time: 06/03/23 12:51 AM   Specimen: Nasopharyngeal Swab; Respiratory  Result Value Ref Range Status   Adenovirus NOT DETECTED NOT DETECTED Final   Coronavirus 229E NOT DETECTED NOT DETECTED Final    Comment: (NOTE) The Coronavirus on the Respiratory Panel, DOES NOT test for the novel  Coronavirus (2019 nCoV)    Coronavirus HKU1 NOT DETECTED NOT DETECTED Final   Coronavirus NL63 NOT DETECTED NOT DETECTED Final   Coronavirus OC43 NOT DETECTED NOT DETECTED Final   Metapneumovirus NOT DETECTED NOT DETECTED Final   Rhinovirus / Enterovirus NOT DETECTED NOT DETECTED Final   Influenza A NOT DETECTED NOT DETECTED Final   Influenza B NOT DETECTED NOT DETECTED Final   Parainfluenza Virus 1 NOT DETECTED NOT DETECTED Final   Parainfluenza Virus 2 NOT DETECTED NOT DETECTED Final   Parainfluenza Virus 3 NOT DETECTED NOT DETECTED Final   Parainfluenza Virus 4 NOT DETECTED NOT DETECTED Final   Respiratory Syncytial Virus NOT DETECTED NOT DETECTED Final   Bordetella pertussis NOT DETECTED NOT DETECTED Final   Bordetella Parapertussis NOT DETECTED NOT DETECTED Final   Chlamydophila pneumoniae NOT DETECTED NOT DETECTED Final   Mycoplasma pneumoniae NOT DETECTED NOT DETECTED Final    Comment: Performed at [facility], [address] [place], [place] [address]  Culture, blood (Routine  X 2) w Reflex to ID Panel     Status: Abnormal   Collection Time: 06/03/23 12:54 AM   Specimen: BLOOD LEFT ARM  Result Value Ref Range Status   Specimen Description BLOOD LEFT ARM  Final   Special Requests   Final    BOTTLES DRAWN AEROBIC AND ANAEROBIC Blood Culture adequate volume   Culture  Setup Time   Final    GRAM POSITIVE COCCI ANAEROBIC BOTTLE ONLY CRITICAL RESULT CALLED TO, READ BACK BY AND VERIFIED WITH: PHARMD MADISON Barry Dienes [phone] [id number] BY Berline Chough, [place] Performed at [facility], [address] [place], [place] [address]    Culture ENTEROCOCCUS FAECIUM (A)  Final   Report Status 06/05/2023 FINAL  Final   Organism ID, Bacteria  ENTEROCOCCUS FAECIUM  Final      Susceptibility   Enterococcus faecium - MIC*    AMPICILLIN <=2 SENSITIVE Sensitive     VANCOMYCIN <=0.5 SENSITIVE Sensitive     GENTAMICIN SYNERGY SENSITIVE Sensitive     * ENTEROCOCCUS FAECIUM  Blood Culture ID Panel (Reflexed)     Status: Abnormal   Collection Time: 06/03/23 12:54 AM  Result Value Ref Range Status   Enterococcus faecalis NOT DETECTED NOT DETECTED Final   Enterococcus Faecium DETECTED (A) NOT DETECTED Final    Comment: CRITICAL RESULT CALLED TO, READ BACK BY AND VERIFIED WITH: PHARMD MADISON OWEN [phone] [id number] BY J RAZZAK, MT    Listeria monocytogenes NOT DETECTED NOT DETECTED Final   Staphylococcus species NOT DETECTED NOT DETECTED Final   Staphylococcus aureus (BCID) NOT DETECTED NOT DETECTED Final   Staphylococcus epidermidis NOT DETECTED NOT DETECTED Final   Staphylococcus lugdunensis NOT DETECTED NOT DETECTED Final   Streptococcus species NOT DETECTED NOT DETECTED Final   Streptococcus agalactiae NOT DETECTED NOT DETECTED Final   Streptococcus pneumoniae NOT DETECTED NOT DETECTED Final   Streptococcus pyogenes NOT DETECTED NOT DETECTED Final   A.calcoaceticus-baumannii NOT DETECTED NOT DETECTED Final   Bacteroides fragilis NOT DETECTED NOT DETECTED Final   Enterobacterales NOT  DETECTED NOT DETECTED Final   Enterobacter cloacae complex NOT DETECTED NOT DETECTED Final   Escherichia coli NOT DETECTED NOT DETECTED Final   Klebsiella aerogenes NOT DETECTED NOT DETECTED Final   Klebsiella oxytoca NOT DETECTED NOT DETECTED Final   Klebsiella pneumoniae NOT DETECTED NOT DETECTED Final   Proteus species NOT DETECTED NOT DETECTED Final   Salmonella species NOT DETECTED NOT DETECTED Final   Serratia marcescens NOT DETECTED NOT DETECTED Final   Haemophilus influenzae NOT DETECTED NOT DETECTED Final   Neisseria meningitidis NOT DETECTED NOT DETECTED Final   Pseudomonas aeruginosa NOT DETECTED NOT DETECTED Final   Stenotrophomonas maltophilia NOT DETECTED NOT DETECTED Final   Candida albicans NOT DETECTED NOT DETECTED Final   Candida auris NOT DETECTED NOT DETECTED Final   Candida glabrata NOT DETECTED NOT DETECTED Final   Candida krusei NOT DETECTED NOT DETECTED Final   Candida parapsilosis NOT DETECTED NOT DETECTED Final   Candida tropicalis NOT DETECTED NOT DETECTED Final   Cryptococcus neoformans/gattii NOT DETECTED NOT DETECTED Final   Vancomycin resistance NOT DETECTED NOT DETECTED Final    Comment: Performed at [facility], [address] [place], [place] [address]  Culture, blood (Routine X 2) w Reflex to ID Panel     Status: Abnormal   Collection Time: 06/03/23 12:56 AM   Specimen: BLOOD LEFT HAND  Result Value Ref Range Status   Specimen Description BLOOD LEFT HAND  Final   Special Requests   Final    BOTTLES DRAWN AEROBIC AND ANAEROBIC Blood Culture adequate volume   Culture  Setup Time GRAM POSITIVE COCCI ANAEROBIC BOTTLE ONLY   Final   Culture (A)  Final    ENTEROCOCCUS FAECIUM SUSCEPTIBILITIES PERFORMED ON PREVIOUS CULTURE WITHIN THE LAST 5 DAYS. Performed at [facility], [address], [place], [place] [address]    Report Status 06/05/2023 FINAL  Final  Culture, blood (Routine X 2) w Reflex to ID Panel     Status: None (Preliminary  result)   Collection Time: 06/04/23  7:46 AM   Specimen: BLOOD RIGHT HAND  Result Value Ref Range Status   Specimen Description BLOOD RIGHT HAND  Final   Special Requests   Final  BOTTLES DRAWN AEROBIC AND ANAEROBIC Blood Culture adequate volume   Culture   Final    NO GROWTH 2 DAYS Performed at [facility], [address] [place], [place] [address]    Report Status PENDING  Incomplete  Culture, blood (Routine X 2) w Reflex to ID Panel     Status: None (Preliminary result)   Collection Time: 06/04/23  7:48 AM   Specimen: BLOOD LEFT HAND  Result Value Ref Range Status   Specimen Description BLOOD LEFT HAND  Final   Special Requests   Final    BOTTLES DRAWN AEROBIC AND ANAEROBIC Blood Culture results may not be optimal due to an excessive volume of blood received in culture bottles   Culture   Final    NO GROWTH 2 DAYS Performed at [facility], [address], [place], [place] [address]    Report Status PENDING  Incomplete  MRSA Next Gen by PCR, Nasal     Status: None   Collection Time: 06/06/23  9:14 AM   Specimen: Nasal Mucosa; Nasal Swab  Result Value Ref Range Status   MRSA by PCR Next Gen NOT DETECTED NOT DETECTED Final    Comment: (NOTE) The GeneXpert MRSA Assay (FDA approved for NASAL specimens only), is one component of a comprehensive MRSA colonization surveillance program. It is not intended to diagnose MRSA infection nor to guide or monitor treatment for MRSA infections. Test performance is not FDA approved in patients less than 24 years old. Performed at [facility], [address] [place], [place] [address]          Radiology Studies: DG Chest Port 1 View  Result Date: 06/06/2023 CLINICAL DATA:  Shortness of breath EXAM: PORTABLE CHEST 1 VIEW COMPARISON:  06/02/2023 FINDINGS: No significant change in AP portable chest radiograph. Cardiomegaly with left chest multi lead pacer. Diffuse bilateral interstitial pulmonary opacity  superimposed upon emphysema and small bilateral pleural effusions and or pleural thickening. No new airspace opacity. IMPRESSION: 1. No significant change in AP portable chest radiograph. 2. Cardiomegaly with diffuse bilateral interstitial pulmonary opacity superimposed upon emphysema, likely edema. No new airspace opacity. 3. Small bilateral pleural effusions and or pleural thickening. Electronically Signed   By: Jearld Lesch M.D.   On: 06/06/2023 09:28   CT THORACIC SPINE WO CONTRAST  Result Date: 06/05/2023 CLINICAL DATA:  Bacteremia, concern for vertebral impaction EXAM: CT THORACIC SPINE WITHOUT CONTRAST TECHNIQUE: Multidetector CT images of the thoracic were obtained using the standard protocol without intravenous contrast. RADIATION DOSE REDUCTION: This exam was performed according to the departmental dose-optimization program which includes automated exposure control, adjustment of the mA and/or kV according to patient size and/or use of iterative reconstruction technique. COMPARISON:  Chest radiograph dated 10/06/2022 FINDINGS: Alignment: Normal thoracic kyphosis. Vertebrae: Moderate anterior compression fracture deformity at T8, chronic. Mild superior endplate compression fracture deformity at T9, chronic. Mild to moderate superior endplate compression fracture with Schmorl's node deformity at T12, chronic. No acute fracture is seen. Paraspinal and other soft tissues: Atherosclerotic calcifications of the aortic arch. Moderate three-vessel coronary atherosclerosis. Left subclavian ICD, incompletely visualized. Trace bilateral pleural effusions, right greater than left. Mild centrilobular and paraseptal emphysematous changes, upper lung predominant. Disc levels: Mild multilevel degenerative changes, most prominent at T8-9. Spinal canal remains patent. No fluid collections or destructive changes to suggest discitis osteomyelitis. IMPRESSION: No evidence of discitis osteomyelitis. Chronic compression  fracture deformities, as above. Mild multilevel degenerative changes. Electronically Signed   By:  Charline Bills M.D.   On: 06/05/2023 19:57   CT LUMBAR SPINE WO CONTRAST  Result Date: 06/05/2023 CLINICAL DATA:  Bacteremia, concern for vertebral infection EXAM: CT LUMBAR SPINE WITHOUT CONTRAST TECHNIQUE: Multidetector CT imaging of the lumbar spine was performed without intravenous contrast administration. Multiplanar CT image reconstructions were also generated. RADIATION DOSE REDUCTION: This exam was performed according to the departmental dose-optimization program which includes automated exposure control, adjustment of the mA and/or kV according to patient size and/or use of iterative reconstruction technique. COMPARISON:  None Available. FINDINGS: Segmentation: 5 lumbar type vertebral bodies. Alignment: Normal lumbar lordosis.  Upper lumbar dextroscoliosis. Vertebrae: Mild superior endplate compression fracture deformity at L1, chronic. No acute fracture is seen. Paraspinal and other soft tissues: Nonobstructing left renal calculus and left renal cysts, poorly visualized due to motion degradation. Vascular calcifications. Disc levels: Moderate multilevel degenerative changes, most prominent at L3-4 with an associated left paracentral disc protrusion (series 3/image 77), with mild spinal canal narrowing at this level. No fluid collections or destructive changes to suggest discitis osteomyelitis. IMPRESSION: No evidence of discitis osteomyelitis. Moderate multilevel degenerative changes, most prominent at L3-4, as described above. Electronically Signed   By: Charline Bills M.D.   On: 06/05/2023 19:54        Scheduled Meds:  apixaban  2.5 mg Oral BID   aspirin EC  81 mg Oral Daily   carvedilol  6.25 mg Oral BID WC   diltiazem  120 mg Oral Daily   furosemide  40 mg Oral Daily   guaiFENesin  600 mg Oral BID   ipratropium  0.5 mg Nebulization Q6H   levalbuterol  1.25 mg Nebulization Q6H    methylPREDNISolone (SOLU-MEDROL) injection  80 mg Intravenous Q12H   montelukast  10 mg Oral QHS   pantoprazole  40 mg Oral Daily   sodium chloride flush  3 mL Intravenous Q12H   Continuous Infusions:  sodium chloride     ampicillin (OMNIPEN) IV 2 g (06/06/23 1635)   cefTRIAXone (ROCEPHIN)  IV Stopped (06/06/23 0857)     LOS: 4 days    Time spent: 35 minutes    Berton Mount, MD  [facility] Pager #: [phone] 7PM-7AM contact night coverage as above

## 2023-06-06 NOTE — Plan of Care (Signed)
  Problem: Health Behavior/Discharge Planning: Goal: Ability to manage health-related needs will improve Outcome: Progressing   Problem: Nutrition: Goal: Adequate nutrition will be maintained Outcome: Progressing   Problem: Pain Managment: Goal: General experience of comfort will improve Outcome: Progressing   Problem: Safety: Goal: Ability to remain free from injury will improve Outcome: Progressing   Problem: Skin Integrity: Goal: Risk for impaired skin integrity will decrease Outcome: Progressing   

## 2023-06-06 NOTE — Progress Notes (Signed)
Pt complains of SOB, feels like choking and noted nonproductive coughing. She said she might be having an allergic reaction to Percocet. Increased O2 to 4L [place] with O2sat at 96%. Denies any chest pain, Albuterol PRN neb and Benadryl PRN were given. Notified MD and made new orders.

## 2023-06-06 NOTE — TOC Initial Note (Signed)
Transition of Care [facility]) - Initial/Assessment Note    Patient Details  Name: [name] MRN: [id number] Date of Birth: 10/09/1941  Transition of Care [facility]) CM/SW Contact:    Gala Lewandowsky, RN Phone Number: 06/06/2023, 4:19 PM  Clinical Narrative:  Patient presented for abdominal pain and hypertension. PTA patient was from home with spouse. Patient has DME: cane, rolling walker, and oxygen @ 2 Liters via [employer]. Case Manager will continue to follow for transition of care needs as she progresses.                 Expected Discharge Plan: Home w Home Health Services Barriers to Discharge: Continued Medical Work up   Patient Goals and CMS Choice Patient states their goals for this hospitalization and ongoing recovery are:: to return home   Expected Discharge Plan and Services In-house Referral: NA Discharge Planning Services: CM Consult Post Acute Care Choice: Home Health Living arrangements for the past 2 months: Single Family Home                   DME Agency: NA   Prior Living Arrangements/Services Living arrangements for the past 2 months: Single Family Home Lives with:: Spouse Patient language and need for interpreter reviewed:: Yes Do you feel safe going back to the place where you live?: Yes      Need for Family Participation in Patient Care: Yes (Comment) Care giver support system in place?: Yes (comment) Current home services: DME (cane, rolling walker, and oxygen @ 2 Liters via [employer].) Criminal Activity/Legal Involvement Pertinent to Current Situation/Hospitalization: No - Comment as needed  Activities of Daily Living Home Assistive Devices/Equipment: Bedside commode/3-in-1, Dentures (specify type), Eyeglasses, Grab bars around toilet, Hearing aid, Oxygen, Walker (specify type), Wheelchair ADL Screening (condition at time of admission) Patient's cognitive ability adequate to safely complete daily activities?: Yes Is  the patient deaf or have difficulty hearing?: Yes Does the patient have difficulty seeing, even when wearing glasses/contacts?: No Does the patient have difficulty concentrating, remembering, or making decisions?: No Patient able to express need for assistance with ADLs?: Yes Does the patient have difficulty dressing or bathing?: Yes Independently performs ADLs?: No Communication: Independent Dressing (OT): Independent Grooming: Needs assistance Is this a change from baseline?: Pre-admission baseline Feeding: Independent Bathing: Needs assistance Is this a change from baseline?: Pre-admission baseline Toileting: Independent In/Out Bed: Independent Walks in Home: Independent with device (comment) Does the patient have difficulty walking or climbing stairs?: Yes Weakness of Legs: Both Weakness of Arms/Hands: None  Permission Sought/Granted Permission sought to share information with : Family Supports, Case Production designer, theatre/television/film, Oceanographer granted to share information with : Yes, Verbal Permission Granted   Emotional Assessment Appearance:: Appears stated age Attitude/Demeanor/Rapport: Engaged Affect (typically observed): Appropriate Orientation: : Oriented to Self, Oriented to Place, Oriented to  Time Alcohol / Substance Use: Not Applicable Psych Involvement: No (comment)  Admission diagnosis:  A-fib (HCC) [I48.91] COPD exacerbation (HCC) [J44.1] Hypertensive urgency [I16.0] Patient Active Problem List   Diagnosis Date Noted   Bacteremia due to Enterococcus 06/05/2023   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   Left lower quadrant abdominal pain 06/05/2023   Foot lesion 06/05/2023   A-fib (HCC) 06/02/2023   COPD with acute exacerbation (HCC) 06/02/2023   Hypertensive emergency 06/02/2023   Anemia 06/02/2023   Leg swelling 06/02/2023   Abnormal EKG    Postprocedural hypotension    Carotid stenosis 02/22/2016   Encounter for postoperative carotid  endarterectomy surveillance 02/22/2016   Occlusion and stenosis of carotid artery without mention of cerebral infarction 09/07/2011   History of carotid endarterectomy 09/07/2011   PCP:  Leane Call, PA-C Pharmacy:   [facility] [address], [place] - [address] [place] [address] Phone: [phone] Fax: [phone]  Social Determinants of Health (SDOH) Social History: SDOH Screenings   Food Insecurity: No Food Insecurity (06/03/2023)  Housing: Low Risk  (06/03/2023)  Transportation Needs: No Transportation Needs (06/03/2023)  Utilities: Not At Risk (06/03/2023)  Tobacco Use: High Risk (06/02/2023)   Readmission Risk Interventions     No data to display

## 2023-06-06 NOTE — Progress Notes (Addendum)
TRH night cross cover note:   I was notified by RN that this patient, who is hospitalized with Enterococcus bacteremia as well as acute COPD exacerbation, is complaining of some worsening shortness of breath this evening.  The patient is concerned over potential allergic reaction to the third dose of Percocet that she has received over the last 24 hours.  Not associated w/ any worsening wheezing nor any stridor. No CP. Not associated w/ any report of rash, tongue/lip swelling or any sensation of throat closure.  No new GI symptoms reported.  She notes some mild nonproductive cough.  She has already received a dose of IV Benadryl.   Current/most recent vital signs notable for the following: Afebrile; heart rates in the 70s to 80s; blood pressure 169/98, respiratory 16, oxygen saturation 96% on 4 L nasal cannula, representing a slight increase in supplemental oxygen requirements relative to the 2 L nasal cannula upon which she was using throughout dayshift.  No evidence of 3 organ system involvement to suggest an anaphylactic picture, and she appears hemodynamically stable.  No overt evidence to suggest airway compromise at this time. Patient has already received 3 doses of prn Percocet over the course of his hospitalization.  However, will hold additional Percocet at this time and will provide a dose of pepcid 20 mg IV x 1 now.  In the setting of her presenting acute COPD exacerbation, with worsening shortness of breath this evening and interval increase in supplemental oxygen requirements, I've ordered scheduled solu-Medrol 80 mg IV x bid, with first dose now.  She has also received an as needed nebulizer treatment. Will scheduled xopenex-ipratropium nebs q6hours at this time as well, while continuing existing order for singulair.  To evaluate her worsening shortness breath, will also order chest x-ray, EKG, serum magnesium and phosphorus levels, BNP.  It Is noted that she is not currently on IV fluids.   Also ordered flutter valve, incentive spirometry, and scheduled guaifenesin.  Acute pulmonary embolism appears less likely as the patient is chronically anticoagulated on Eliquis.  In the setting of her presenting Enterococcus bacteremia she is currently on antibiotics including ampicillin and Rocephin.    Update: Lab results notable for BNP of 320, which is trending down from most recent prior value of 334 when checked 4 days ago.  This morning's BMP reflects potassium level of 3.3, down from yesterday's value of 4.1.  Additionally, serum magnesium level found to be 1.8 this morning while phosphorus is noted to be within normal limits.  I subsequently ordered potassium chloride 40 mill equivalents p.o. x 1 dose now.  Additionally, in the setting of hypokalemia as well as the patient's shortness of breath associated with wheezing, I've also ordered 2 g of IV magnesium sulfate over 2 hours.    Newton Pigg, DO Hospitalist

## 2023-06-06 NOTE — Progress Notes (Signed)
Subjective:   She was anxious about how long she might be in the hospital when I talked her today she was feeling better compared to yesterday   Antibiotics:  Anti-infectives (From admission, onward)    Start     Dose/Rate Route Frequency Ordered Stop   06/05/23 2200  cefTRIAXone (ROCEPHIN) 2 g in sodium chloride 0.9 % 100 mL IVPB        2 g 200 mL/hr over 30 Minutes Intravenous Every 12 hours 06/05/23 1507     06/05/23 1800  vancomycin (VANCOCIN) IVPB 1000 mg/200 mL premix  Status:  Discontinued        1,000 mg 200 mL/hr over 60 Minutes Intravenous Every 48 hours 06/03/23 1918 06/05/23 1507   06/05/23 1800  ampicillin (OMNIPEN) 2 g in sodium chloride 0.9 % 100 mL IVPB        2 g 300 mL/hr over 20 Minutes Intravenous Every 6 hours 06/05/23 1507     06/05/23 1030  amoxicillin (AMOXIL) capsule 500 mg        500 mg Oral  Once 06/05/23 0943 06/05/23 1013   06/03/23 1930  vancomycin (VANCOREADY) IVPB 1250 mg/250 mL        1,250 mg 166.7 mL/hr over 90 Minutes Intravenous  Once 06/03/23 1835 06/03/23 2212   06/02/23 2300  cefTRIAXone (ROCEPHIN) 1 g in sodium chloride 0.9 % 100 mL IVPB  Status:  Discontinued       Note to Pharmacy: Patient has tolerated amoxicillin in the past. She has taken cefdinir. Her reaction was only to augmentin (I believe from clavulante acid)   1 g 200 mL/hr over 30 Minutes Intravenous Every 24 hours 06/02/23 2257 06/03/23 1840   06/02/23 2300  azithromycin (ZITHROMAX) 500 mg in sodium chloride 0.9 % 250 mL IVPB  Status:  Discontinued        500 mg 250 mL/hr over 60 Minutes Intravenous Every 24 hours 06/02/23 2257 06/03/23 1840       Medications: Scheduled Meds:  apixaban  2.5 mg Oral BID   aspirin EC  81 mg Oral Daily   carvedilol  6.25 mg Oral BID WC   diltiazem  120 mg Oral Daily   furosemide  40 mg Oral Daily   guaiFENesin  600 mg Oral BID   ipratropium  0.5 mg Nebulization Q6H   levalbuterol  1.25 mg Nebulization Q6H    methylPREDNISolone (SOLU-MEDROL) injection  80 mg Intravenous Q12H   montelukast  10 mg Oral QHS   pantoprazole  40 mg Oral Daily   sodium chloride flush  3 mL Intravenous Q12H   Continuous Infusions:  ampicillin (OMNIPEN) IV 2 g (06/06/23 1221)   cefTRIAXone (ROCEPHIN)  IV 2 g (06/06/23 0817)   PRN Meds:.acetaminophen **OR** acetaminophen, diltiazem, EPINEPHrine, hydrALAZINE, hydrOXYzine, levalbuterol, melatonin, menthol-cetylpyridinium, naLOXone (NARCAN)  injection, ondansetron (ZOFRAN) IV, mouth rinse, polyethylene glycol    Objective: Weight change: -1.819 kg  Intake/Output Summary (Last 24 hours) at 06/06/2023 1525 Last data filed at 06/06/2023 0647 Gross per 24 hour  Intake 980.71 ml  Output 800 ml  Net 180.71 ml   Blood pressure 107/67, pulse 80, temperature 98.3 F (36.8 C), temperature source Oral, resp. rate 16, height 5' (1.524 m), weight 52.5 kg, SpO2 98%. Temp:  [97.5 F (36.4 C)-98.3 F (36.8 C)] 98.3 F (36.8 C) (08/13 1433) Pulse Rate:  [80-85] 80 (08/13 1433) Resp:  [16-18] 16 (08/13 1433) BP: (107-154)/(67-84) 107/67 (08/13 1433) SpO2:  [95 %-100 %]  98 % (08/13 1433) Weight:  [52.5 kg] 52.5 kg (08/13 0617)  Physical Exam: Physical Exam Constitutional:      General: She is not in acute distress.    Appearance: She is well-developed. She is not diaphoretic.  HENT:     Head: Normocephalic and atraumatic.     Right Ear: External ear normal.     Left Ear: External ear normal.     Mouth/Throat:     Pharynx: No oropharyngeal exudate.  Eyes:     General: No scleral icterus.    Conjunctiva/sclera: Conjunctivae normal.     Pupils: Pupils are equal, round, and reactive to light.  Cardiovascular:     Rate and Rhythm: Normal rate. Rhythm irregular.     Heart sounds: Normal heart sounds. No murmur heard.    No friction rub. No gallop.  Pulmonary:     Effort: Pulmonary effort is normal. No respiratory distress.     Breath sounds: Normal breath sounds. No  stridor. No wheezing or rales.  Abdominal:     General: Bowel sounds are normal. There is no distension.     Palpations: Abdomen is soft.     Tenderness: There is no abdominal tenderness. There is no rebound.  Musculoskeletal:        General: No tenderness. Normal range of motion.  Lymphadenopathy:     Cervical: No cervical adenopathy.  Skin:    General: Skin is warm and dry.     Coloration: Skin is not pale.     Findings: No erythema or rash.  Neurological:     General: No focal deficit present.     Mental Status: She is alert and oriented to person, place, and time.     Motor: No abnormal muscle tone.     Coordination: Coordination normal.  Psychiatric:        Mood and Affect: Mood normal.        Behavior: Behavior normal.        Thought Content: Thought content normal.        Judgment: Judgment normal.      CBC:    BMET Recent Labs    06/04/23 0100 06/06/23 0042  NA 135 126*  K 4.1 3.3*  CL 95* 83*  CO2 28 31  GLUCOSE 147* 124*  BUN 21 9  CREATININE 1.26* 0.83  CALCIUM 8.7* 7.7*     Liver Panel  Recent Labs    06/04/23 0100  ALBUMIN 3.1*       Sedimentation Rate No results for input(s): "ESRSEDRATE" in the last 72 hours. C-Reactive Protein No results for input(s): "CRP" in the last 72 hours.  Micro Results: Recent Results (from the past 720 hour(s))  SARS Coronavirus 2 by RT PCR (hospital order, performed in [facility] hospital lab) *cepheid single result test* Anterior Nasal Swab     Status: None   Collection Time: 06/03/23 12:50 AM   Specimen: Anterior Nasal Swab  Result Value Ref Range Status   SARS Coronavirus 2 by RT PCR NEGATIVE NEGATIVE Final    Comment: Performed at [facility], [address] [place], [place] [address]  Respiratory (~20 pathogens) panel by PCR     Status: None   Collection Time: 06/03/23 12:51 AM   Specimen: Nasopharyngeal Swab; Respiratory  Result Value Ref Range Status   Adenovirus NOT DETECTED NOT  DETECTED Final   Coronavirus 229E NOT DETECTED NOT DETECTED Final    Comment: (NOTE) The Coronavirus on the Respiratory Panel, DOES  NOT test for the novel  Coronavirus (2019 nCoV)    Coronavirus HKU1 NOT DETECTED NOT DETECTED Final   Coronavirus NL63 NOT DETECTED NOT DETECTED Final   Coronavirus OC43 NOT DETECTED NOT DETECTED Final   Metapneumovirus NOT DETECTED NOT DETECTED Final   Rhinovirus / Enterovirus NOT DETECTED NOT DETECTED Final   Influenza A NOT DETECTED NOT DETECTED Final   Influenza B NOT DETECTED NOT DETECTED Final   Parainfluenza Virus 1 NOT DETECTED NOT DETECTED Final   Parainfluenza Virus 2 NOT DETECTED NOT DETECTED Final   Parainfluenza Virus 3 NOT DETECTED NOT DETECTED Final   Parainfluenza Virus 4 NOT DETECTED NOT DETECTED Final   Respiratory Syncytial Virus NOT DETECTED NOT DETECTED Final   Bordetella pertussis NOT DETECTED NOT DETECTED Final   Bordetella Parapertussis NOT DETECTED NOT DETECTED Final   Chlamydophila pneumoniae NOT DETECTED NOT DETECTED Final   Mycoplasma pneumoniae NOT DETECTED NOT DETECTED Final    Comment: Performed at [facility], [address] [place], [place] [address]  Culture, blood (Routine X 2) w Reflex to ID Panel     Status: Abnormal   Collection Time: 06/03/23 12:54 AM   Specimen: BLOOD LEFT ARM  Result Value Ref Range Status   Specimen Description BLOOD LEFT ARM  Final   Special Requests   Final    BOTTLES DRAWN AEROBIC AND ANAEROBIC Blood Culture adequate volume   Culture  Setup Time   Final    GRAM POSITIVE COCCI ANAEROBIC BOTTLE ONLY CRITICAL RESULT CALLED TO, READ BACK BY AND VERIFIED WITH: PHARMD MADISON Barry Dienes [phone] [id number] BY Berline Chough, [place] Performed at [facility], [address], [place], [place] [address]    Culture ENTEROCOCCUS FAECIUM (A)  Final   Report Status 06/05/2023 FINAL  Final   Organism ID, Bacteria ENTEROCOCCUS FAECIUM  Final      Susceptibility   Enterococcus faecium - MIC*    AMPICILLIN  <=2 SENSITIVE Sensitive     VANCOMYCIN <=0.5 SENSITIVE Sensitive     GENTAMICIN SYNERGY SENSITIVE Sensitive     * ENTEROCOCCUS FAECIUM  Blood Culture ID Panel (Reflexed)     Status: Abnormal   Collection Time: 06/03/23 12:54 AM  Result Value Ref Range Status   Enterococcus faecalis NOT DETECTED NOT DETECTED Final   Enterococcus Faecium DETECTED (A) NOT DETECTED Final    Comment: CRITICAL RESULT CALLED TO, READ BACK BY AND VERIFIED WITH: PHARMD MADISON OWEN [phone] [id number] BY J RAZZAK, [place]    Listeria monocytogenes NOT DETECTED NOT DETECTED Final   Staphylococcus species NOT DETECTED NOT DETECTED Final   Staphylococcus aureus (BCID) NOT DETECTED NOT DETECTED Final   Staphylococcus epidermidis NOT DETECTED NOT DETECTED Final   Staphylococcus lugdunensis NOT DETECTED NOT DETECTED Final   Streptococcus species NOT DETECTED NOT DETECTED Final   Streptococcus agalactiae NOT DETECTED NOT DETECTED Final   Streptococcus pneumoniae NOT DETECTED NOT DETECTED Final   Streptococcus pyogenes NOT DETECTED NOT DETECTED Final   A.calcoaceticus-baumannii NOT DETECTED NOT DETECTED Final   Bacteroides fragilis NOT DETECTED NOT DETECTED Final   Enterobacterales NOT DETECTED NOT DETECTED Final   Enterobacter cloacae complex NOT DETECTED NOT DETECTED Final   Escherichia coli NOT DETECTED NOT DETECTED Final   Klebsiella aerogenes NOT DETECTED NOT DETECTED Final   Klebsiella oxytoca NOT DETECTED NOT DETECTED Final   Klebsiella pneumoniae NOT DETECTED NOT DETECTED Final   Proteus species NOT DETECTED NOT DETECTED Final   Salmonella species NOT DETECTED NOT DETECTED Final   Serratia  marcescens NOT DETECTED NOT DETECTED Final   Haemophilus influenzae NOT DETECTED NOT DETECTED Final   Neisseria meningitidis NOT DETECTED NOT DETECTED Final   Pseudomonas aeruginosa NOT DETECTED NOT DETECTED Final   Stenotrophomonas maltophilia NOT DETECTED NOT DETECTED Final   Candida albicans NOT DETECTED NOT DETECTED Final    Candida auris NOT DETECTED NOT DETECTED Final   Candida glabrata NOT DETECTED NOT DETECTED Final   Candida krusei NOT DETECTED NOT DETECTED Final   Candida parapsilosis NOT DETECTED NOT DETECTED Final   Candida tropicalis NOT DETECTED NOT DETECTED Final   Cryptococcus neoformans/gattii NOT DETECTED NOT DETECTED Final   Vancomycin resistance NOT DETECTED NOT DETECTED Final    Comment: Performed at [facility], [address], [place], [place] [address]  Culture, blood (Routine X 2) w Reflex to ID Panel     Status: Abnormal   Collection Time: 06/03/23 12:56 AM   Specimen: BLOOD LEFT HAND  Result Value Ref Range Status   Specimen Description BLOOD LEFT HAND  Final   Special Requests   Final    BOTTLES DRAWN AEROBIC AND ANAEROBIC Blood Culture adequate volume   Culture  Setup Time GRAM POSITIVE COCCI ANAEROBIC BOTTLE ONLY   Final   Culture (A)  Final    ENTEROCOCCUS FAECIUM SUSCEPTIBILITIES PERFORMED ON PREVIOUS CULTURE WITHIN THE LAST 5 DAYS. Performed at [facility], [address], [place], [place] [address]    Report Status 06/05/2023 FINAL  Final  Culture, blood (Routine X 2) w Reflex to ID Panel     Status: None (Preliminary result)   Collection Time: 06/04/23  7:46 AM   Specimen: BLOOD RIGHT HAND  Result Value Ref Range Status   Specimen Description BLOOD RIGHT HAND  Final   Special Requests   Final    BOTTLES DRAWN AEROBIC AND ANAEROBIC Blood Culture adequate volume   Culture   Final    NO GROWTH 2 DAYS Performed at [facility], [address] [place], [place] [address]    Report Status PENDING  Incomplete  Culture, blood (Routine X 2) w Reflex to ID Panel     Status: None (Preliminary result)   Collection Time: 06/04/23  7:48 AM   Specimen: BLOOD LEFT HAND  Result Value Ref Range Status   Specimen Description BLOOD LEFT HAND  Final   Special Requests   Final    BOTTLES DRAWN AEROBIC AND ANAEROBIC Blood Culture results may not be optimal due to an  excessive volume of blood received in culture bottles   Culture   Final    NO GROWTH 2 DAYS Performed at [facility], [address], [place], [place] [address]    Report Status PENDING  Incomplete  MRSA Next Gen by PCR, Nasal     Status: None   Collection Time: 06/06/23  9:14 AM   Specimen: Nasal Mucosa; Nasal Swab  Result Value Ref Range Status   MRSA by PCR Next Gen NOT DETECTED NOT DETECTED Final    Comment: (NOTE) The GeneXpert MRSA Assay (FDA approved for NASAL specimens only), is one component of a comprehensive MRSA colonization surveillance program. It is not intended to diagnose MRSA infection nor to guide or monitor treatment for MRSA infections. Test performance is not FDA approved in patients less than 63 years old. Performed at [facility], [address] [place], [place] [address]     Studies/Results: DG Chest Port 1 View  Result Date: 06/06/2023 CLINICAL  DATA:  Shortness of breath EXAM: PORTABLE CHEST 1 VIEW COMPARISON:  06/02/2023 FINDINGS: No significant change in AP portable chest radiograph. Cardiomegaly with left chest multi lead pacer. Diffuse bilateral interstitial pulmonary opacity superimposed upon emphysema and small bilateral pleural effusions and or pleural thickening. No new airspace opacity. IMPRESSION: 1. No significant change in AP portable chest radiograph. 2. Cardiomegaly with diffuse bilateral interstitial pulmonary opacity superimposed upon emphysema, likely edema. No new airspace opacity. 3. Small bilateral pleural effusions and or pleural thickening. Electronically Signed   By: Jearld Lesch M.D.   On: 06/06/2023 09:28   CT THORACIC SPINE WO CONTRAST  Result Date: 06/05/2023 CLINICAL DATA:  Bacteremia, concern for vertebral impaction EXAM: CT THORACIC SPINE WITHOUT CONTRAST TECHNIQUE: Multidetector CT images of the thoracic were obtained using the standard protocol without intravenous contrast. RADIATION DOSE REDUCTION: This exam was  performed according to the departmental dose-optimization program which includes automated exposure control, adjustment of the mA and/or kV according to patient size and/or use of iterative reconstruction technique. COMPARISON:  Chest radiograph dated 10/06/2022 FINDINGS: Alignment: Normal thoracic kyphosis. Vertebrae: Moderate anterior compression fracture deformity at T8, chronic. Mild superior endplate compression fracture deformity at T9, chronic. Mild to moderate superior endplate compression fracture with Schmorl's node deformity at T12, chronic. No acute fracture is seen. Paraspinal and other soft tissues: Atherosclerotic calcifications of the aortic arch. Moderate three-vessel coronary atherosclerosis. Left subclavian ICD, incompletely visualized. Trace bilateral pleural effusions, right greater than left. Mild centrilobular and paraseptal emphysematous changes, upper lung predominant. Disc levels: Mild multilevel degenerative changes, most prominent at T8-9. Spinal canal remains patent. No fluid collections or destructive changes to suggest discitis osteomyelitis. IMPRESSION: No evidence of discitis osteomyelitis. Chronic compression fracture deformities, as above. Mild multilevel degenerative changes. Electronically Signed   By: Charline Bills M.D.   On: 06/05/2023 19:57   CT LUMBAR SPINE WO CONTRAST  Result Date: 06/05/2023 CLINICAL DATA:  Bacteremia, concern for vertebral infection EXAM: CT LUMBAR SPINE WITHOUT CONTRAST TECHNIQUE: Multidetector CT imaging of the lumbar spine was performed without intravenous contrast administration. Multiplanar CT image reconstructions were also generated. RADIATION DOSE REDUCTION: This exam was performed according to the departmental dose-optimization program which includes automated exposure control, adjustment of the mA and/or kV according to patient size and/or use of iterative reconstruction technique. COMPARISON:  None Available. FINDINGS: Segmentation: 5  lumbar type vertebral bodies. Alignment: Normal lumbar lordosis.  Upper lumbar dextroscoliosis. Vertebrae: Mild superior endplate compression fracture deformity at L1, chronic. No acute fracture is seen. Paraspinal and other soft tissues: Nonobstructing left renal calculus and left renal cysts, poorly visualized due to motion degradation. Vascular calcifications. Disc levels: Moderate multilevel degenerative changes, most prominent at L3-4 with an associated left paracentral disc protrusion (series 3/image 77), with mild spinal canal narrowing at this level. No fluid collections or destructive changes to suggest discitis osteomyelitis. IMPRESSION: No evidence of discitis osteomyelitis. Moderate multilevel degenerative changes, most prominent at L3-4, as described above. Electronically Signed   By: Charline Bills M.D.   On: 06/05/2023 19:54      Assessment/Plan:  INTERVAL HISTORY: patient for TEE tomorrow   Principal Problem:   Bacteremia due to Enterococcus Active Problems:   A-fib (HCC)   COPD with acute exacerbation (HCC)   Hypertensive emergency   Anemia   Leg swelling   Pacemaker infection (HCC)   Left lower quadrant abdominal pain   Foot lesion    [name] is a 82 y.o. 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 Her urine analysis was without pyuria making urine not likely to be the source  #1 AMP S E faecium bacteremia  She has been switched to a dose ampicillin and ceftriaxone  TEE is pending to assess her PM and heart valves  Lumbar and thoracic spine fails to show evidence of discitis or vertebral osteomyelitis I would also like to CT abdomen pelvis to exclude intra-abdominal source of infection  With renal fxn improving will likely get CT with contrast in AM  I have personally spent 50 minutes involved in face-to-face and non-face-to-face activities for this patient on the day of the visit.  Professional time spent includes the following activities: Preparing to see the patient (review of tests), Obtaining and/or reviewing separately obtained history (admission/discharge record), Performing a medically appropriate examination and/or evaluation , Ordering medications/tests/procedures, referring and communicating with other health care professionals, Documenting clinical information in the EMR, Independently interpreting results (not separately reported), Communicating results to the patient/family/caregiver, Counseling and educating the patient/family/caregiver and Care coordination (not separately reported).       LOS: 4 days   Acey Lav 06/06/2023, 3:25 PM

## 2023-06-06 NOTE — Progress Notes (Signed)
TRH night cross cover note:   I was notified by RN that the patient would like to hold off on tomorrow's TEE until she is able to have additional discussions regarding this procedure with cardiology.  In the meantime, she requests that the procedure be formally discontinued leading up to these additional discussions.  Per the patient's wishes, I have discontinued the existing order for TEE.      Melinda Pigg, DO Hospitalist

## 2023-06-07 ENCOUNTER — Encounter (HOSPITAL_COMMUNITY): Payer: Self-pay | Admitting: Registered Nurse

## 2023-06-07 ENCOUNTER — Encounter (HOSPITAL_COMMUNITY): Admission: EM | Disposition: E | Payer: Self-pay | Source: Home / Self Care | Attending: Internal Medicine

## 2023-06-07 DIAGNOSIS — R7881 Bacteremia: Secondary | ICD-10-CM | POA: Diagnosis not present

## 2023-06-07 DIAGNOSIS — B952 Enterococcus as the cause of diseases classified elsewhere: Secondary | ICD-10-CM | POA: Diagnosis not present

## 2023-06-07 SURGERY — ECHOCARDIOGRAM, TRANSESOPHAGEAL
Anesthesia: Monitor Anesthesia Care

## 2023-06-07 MED ORDER — IPRATROPIUM BROMIDE 0.02 % IN SOLN
0.5000 mg | Freq: Three times a day (TID) | RESPIRATORY_TRACT | Status: DC
  Administered 2023-06-07 – 2023-06-11 (×12): 0.5 mg via RESPIRATORY_TRACT
  Filled 2023-06-07 (×14): qty 2.5

## 2023-06-07 MED ORDER — FERROUS SULFATE 325 (65 FE) MG PO TABS
325.0000 mg | ORAL_TABLET | Freq: Every day | ORAL | Status: DC
  Administered 2023-06-07 – 2023-06-16 (×8): 325 mg via ORAL
  Filled 2023-06-07 (×10): qty 1

## 2023-06-07 MED ORDER — LEVALBUTEROL HCL 1.25 MG/0.5ML IN NEBU
1.2500 mg | INHALATION_SOLUTION | Freq: Three times a day (TID) | RESPIRATORY_TRACT | Status: DC
  Administered 2023-06-07 – 2023-06-11 (×9): 1.25 mg via RESPIRATORY_TRACT
  Filled 2023-06-07 (×17): qty 0.5

## 2023-06-07 NOTE — Progress Notes (Signed)
At 23:00 patient requested that tomorrow's TEE be cancelled.The patient along with her husband would like to discuss with rounding team before consent is granted. Dr. Arlean Hopping notified and procedure cancelled.

## 2023-06-07 NOTE — Plan of Care (Signed)

## 2023-06-07 NOTE — Progress Notes (Signed)
PROGRESS NOTE    [name]  ZOX:[id number] DOB: May 09, 1941 DOA: 06/02/2023 PCP: Leane Call, PA-C  Outpatient Specialists:     Brief Narrative:  Patient is an 82 year old female with past medical history significant for COPD on home oxygen, coronary artery disease, carotid artery occlusion, congestive heart failure, hyperlipidemia, paroxysmal atrial tachycardia, rheumatic fever and seizure.  There is also documentation of asthma.  Patient has chronic low back pain.  Patient was admitted with hypertensive emergency (systolic blood pressure of 240 mmHg).  Documented leg edema.  Leg edema has resolved.  Blood pressure slowly improving.  06/03/2023: Patient seen alongside patient's husband.  Patient is eager to be discharged back home.  Patient reports abdominal distention.  Abdominal x-ray revealed gaseous distention with significant amount of stools.  Suspect patient has been on chronic opiates.  Will try enema.  Patient may end up needing peripheral opiate blocker.  Continue to optimize blood pressure.  Likely discharge back home tomorrow.  Will optimize shortness of breath treatment as well.  06/04/2023: Patient seen alongside patient's husband.  No new complaints.  Blood pressure is controlled.  Systolic blood pressure is 130 mmHg.  Blood culture grew Enterococcus faecium 2 out of 2 bottles.  Patient is currently on IV vancomycin.  Follow final blood culture result.  Follow results of echocardiogram.  Consult infectious disease in the morning.  On further questioning, patient and patient husband reported that patient has had back pain, back joint injection in the past and recent infection around the nose that required drainage.  Patient has a pacemaker placed.  MRI, if needed, will need to be planned.  06/05/2023: Patient seen.  Input from infectious disease and EP team is highly appreciated.  For TEE.  For MRI of the thorax/lumbar spine i.e. if pacemaker is compatible continue to  optimize blood pressure control.  06/06/2023: For TEE tomorrow.  CT of lumbar and thoracic spine is negative for discitis or osteomyelitis.  ID team plans to proceed with CT of the abdomen and pelvis.  Blood pressure is reasonably controlled.  No new symptoms today.  06/07/2023: TEE has been postponed to a later date.  Continue antibiotics.  Blood pressure is controlled.  Input from EP and ID team is appreciated.  No new complaints today.  Assessment & Plan:   Principal Problem:   Bacteremia due to Enterococcus Active Problems:   A-fib (HCC)   COPD with acute exacerbation (HCC)   Hypertensive emergency   Anemia   Leg swelling   Pacemaker infection (HCC)   Left lower quadrant abdominal pain   Foot lesion   Hypertensive emergency: -On presentation, systolic blood pressure of 240 mmHg. -Currently on Cardizem oral/drip, and Lasix. -Systolic blood pressure has risen up to 170 mmHg today. -Start Coreg 3.125 Mg p.o. twice daily.  Monitor respiratory symptoms closely. -Goal blood pressure should be less than 130/80 mmHg.   -Optimize pain control and alleviate any inconveniences. 06/04/2023: Blood pressure is controlled. 06/05/2023: Continue to optimize blood pressure.  Increase Coreg to 6.25 mg twice daily. 06/06/2023: Blood pressure is controlled.  Blood pressures currently 107/67 mmHg. 05/28/2023: Blood pressure is controlled.    Leg swelling -Resolved. -Continue Lasix. -Monitor renal function and electrolytes.   06/04/2023: Resolved.  Bacteremia: -2 out of blood cultures said to be growing Enterococcus. -ID team will be consulted. -Patient may not have true allergy (according to the pharmacy team). -TTE. -Infectious disease team to advise if there is need to pursue TEE. -Likely, Rocephin and azithromycin  will be discontinued. 06/04/2023: Steroid injections to the back previously, as well as, recent infection around the nose that required I&D. 06/05/2023: For TEE.  MRI thoracolumbar  spine if pacemaker is compatible. 06/06/2023: See above documentation.  CT thoracolumbar spine is negative for discitis/osteomyelitis.  For CT abdomen/pelvis as per ID team. 05/28/2023: Pursue TEE.  Complete course of antibiotics.   Anemia -Iron is 23, TIBC of 451, ferritin of 47. -B12 is 5687. -Folate level is 31.3. -Suggestive of iron deficiency anemia, possibly blood loss anemia. -Start ferrous sulfate. -GI follow-up on discharge.  Patient may need EGD and, if indicated lower GI scope as well   COPD with acute exacerbation (HCC) -Continue nebulizer treatment. -History of asthma as noted. -Continue Singulair. -Peak flow daily. -Monitor symptoms closely was on Coreg.  A-fib (HCC) -Chronic diagnosis, continue apixaban. -Rate controlled.  Constipation/gaseous abdominal distention: -Try soapsuds enema. -Laxatives. -Patient may need peripheral opiate blocker. -Minimize opiates as much as possible.   DVT prophylaxis: Apixaban. Code Status: Full code. Family Communication: Husband. Disposition Plan: Likely discharge back home tomorrow.  Patient remains inpatient.   Consultants:  None.  Procedures:  None.  Antimicrobials:  IV Rocephin discontinued IV azithromycin discontinued IV vancomycin   Subjective: -No fever or chills. -No shortness of breath.   Objective: Vitals:   06/07/23 0822 06/07/23 1332 06/07/23 1421 06/07/23 1716  BP: 120/67  124/66 135/83  Pulse: 81  82 83  Resp:   18   Temp:   98.1 F (36.7 C)   TempSrc:   Oral   SpO2: 93% 93% 96% 93%  Weight:      Height:        Intake/Output Summary (Last 24 hours) at 06/07/2023 1935 Last data filed at 06/07/2023 1821 Gross per 24 hour  Intake 833.92 ml  Output 1000 ml  Net -166.08 ml   Filed Weights   06/05/23 0413 06/06/23 0617 06/07/23 0522  Weight: 54.3 kg 52.5 kg 54.6 kg    Examination:  General exam: Appears calm and comfortable. Respiratory system: Decreased air entry with  wheezing. Cardiovascular system: S1 & S2 heard Gastrointestinal system: Abdomen is soft and nontender.  Central nervous system: Alert and oriented.  Patient moves all extremities.   Extremities: No leg edema.  Data Reviewed: I have personally reviewed following labs and imaging studies  CBC: Recent Labs  Lab 06/02/23 1456 06/03/23 0054 06/04/23 0100  WBC 13.9* 6.8 18.1*  NEUTROABS  --   --  15.7*  HGB 11.3* 10.1* 9.1*  HCT 36.2 32.6* 29.5*  MCV 79.0* 79.1* 79.1*  PLT 411* 359 319   Basic Metabolic Panel: Recent Labs  Lab 06/02/23 1456 06/03/23 0054 06/04/23 0100 06/06/23 0042  NA 130* 133* 135 126*  K 2.9* 4.2  4.0 4.1 3.3*  CL 86* 92* 95* 83*  CO2 29 26 28 31   GLUCOSE 111* 212* 147* 124*  BUN 12 11 21 9   CREATININE 1.08* 1.11* 1.26* 0.83  CALCIUM 9.2 8.8* 8.7* 7.7*  MG  --   --  2.0 1.8  PHOS  --   --  4.7* 2.8   GFR: Estimated Creatinine Clearance: 41.9 mL/min (by C-G formula based on SCr of 0.83 mg/dL). Liver Function Tests: Recent Labs  Lab 06/02/23 1456 06/04/23 0100  AST 23  --   ALT 16  --   ALKPHOS 56  --   BILITOT 0.9  --   PROT 6.7  --   ALBUMIN 3.6 3.1*   Recent Labs  Lab 06/02/23  1456  LIPASE 27   No results for input(s): "AMMONIA" in the last 168 hours. Coagulation Profile: Recent Labs  Lab 06/03/23 0054  INR 1.2   Cardiac Enzymes: No results for input(s): "CKTOTAL", "CKMB", "CKMBINDEX", "TROPONINI" in the last 168 hours. BNP (last 3 results) No results for input(s): "PROBNP" in the last 8760 hours. HbA1C: No results for input(s): "HGBA1C" in the last 72 hours. CBG: No results for input(s): "GLUCAP" in the last 168 hours. Lipid Profile: No results for input(s): "CHOL", "HDL", "LDLCALC", "TRIG", "CHOLHDL", "LDLDIRECT" in the last 72 hours. Thyroid Function Tests: No results for input(s): "TSH", "T4TOTAL", "FREET4", "T3FREE", "THYROIDAB" in the last 72 hours. Anemia Panel: No results for input(s): "VITAMINB12", "FOLATE",  "FERRITIN", "TIBC", "IRON", "RETICCTPCT" in the last 72 hours.  Urine analysis:    Component Value Date/Time   COLORURINE STRAW (A) 06/02/2023 1637   APPEARANCEUR CLEAR 06/02/2023 1637   LABSPEC 1.005 06/02/2023 1637   PHURINE 7.0 06/02/2023 1637   GLUCOSEU NEGATIVE 06/02/2023 1637   HGBUR NEGATIVE 06/02/2023 1637   BILIRUBINUR NEGATIVE 06/02/2023 1637   KETONESUR NEGATIVE 06/02/2023 1637   PROTEINUR 30 (A) 06/02/2023 1637   UROBILINOGEN 0.2 11/11/2010 0557   NITRITE NEGATIVE 06/02/2023 1637   LEUKOCYTESUR NEGATIVE 06/02/2023 1637   Sepsis Labs: @LABRCNTIP (procalcitonin:4,lacticidven:4)  ) Recent Results (from the past 240 hour(s))  SARS Coronavirus 2 by RT PCR (hospital order, performed in [facility] hospital lab) *cepheid single result test* Anterior Nasal Swab     Status: None   Collection Time: 06/03/23 12:50 AM   Specimen: Anterior Nasal Swab  Result Value Ref Range Status   SARS Coronavirus 2 by RT PCR NEGATIVE NEGATIVE Final    Comment: Performed at [facility], [address], [place], [place] [address]  Respiratory (~20 pathogens) panel by PCR     Status: None   Collection Time: 06/03/23 12:51 AM   Specimen: Nasopharyngeal Swab; Respiratory  Result Value Ref Range Status   Adenovirus NOT DETECTED NOT DETECTED Final   Coronavirus 229E NOT DETECTED NOT DETECTED Final    Comment: (NOTE) The Coronavirus on the Respiratory Panel, DOES NOT test for the novel  Coronavirus (2019 nCoV)    Coronavirus HKU1 NOT DETECTED NOT DETECTED Final   Coronavirus NL63 NOT DETECTED NOT DETECTED Final   Coronavirus OC43 NOT DETECTED NOT DETECTED Final   Metapneumovirus NOT DETECTED NOT DETECTED Final   Rhinovirus / Enterovirus NOT DETECTED NOT DETECTED Final   Influenza A NOT DETECTED NOT DETECTED Final   Influenza B NOT DETECTED NOT DETECTED Final   Parainfluenza Virus 1 NOT DETECTED NOT DETECTED Final   Parainfluenza Virus 2 NOT DETECTED NOT DETECTED Final   Parainfluenza  Virus 3 NOT DETECTED NOT DETECTED Final   Parainfluenza Virus 4 NOT DETECTED NOT DETECTED Final   Respiratory Syncytial Virus NOT DETECTED NOT DETECTED Final   Bordetella pertussis NOT DETECTED NOT DETECTED Final   Bordetella Parapertussis NOT DETECTED NOT DETECTED Final   Chlamydophila pneumoniae NOT DETECTED NOT DETECTED Final   Mycoplasma pneumoniae NOT DETECTED NOT DETECTED Final    Comment: Performed at [facility], [address], [place], [place] [address]  Culture, blood (Routine X 2) w Reflex to ID Panel     Status: Abnormal   Collection Time: 06/03/23 12:54 AM   Specimen: BLOOD LEFT ARM  Result Value Ref Range Status   Specimen Description BLOOD LEFT ARM  Final   Special Requests   Final    BOTTLES DRAWN AEROBIC  AND ANAEROBIC Blood Culture adequate volume   Culture  Setup Time   Final    GRAM POSITIVE COCCI ANAEROBIC BOTTLE ONLY CRITICAL RESULT CALLED TO, READ BACK BY AND VERIFIED WITH: PHARMD MADISON Barry Dienes [id number] BY Berline Chough, [place] Performed at [facility], [address] [place], [place] [address]    Culture ENTEROCOCCUS FAECIUM (A)  Final   Report Status 06/05/2023 FINAL  Final   Organism ID, Bacteria ENTEROCOCCUS FAECIUM  Final      Susceptibility   Enterococcus faecium - MIC*    AMPICILLIN <=2 SENSITIVE Sensitive     VANCOMYCIN <=0.5 SENSITIVE Sensitive     GENTAMICIN SYNERGY SENSITIVE Sensitive     * ENTEROCOCCUS FAECIUM  Blood Culture ID Panel (Reflexed)     Status: Abnormal   Collection Time: 06/03/23 12:54 AM  Result Value Ref Range Status   Enterococcus faecalis NOT DETECTED NOT DETECTED Final   Enterococcus Faecium DETECTED (A) NOT DETECTED Final    Comment: CRITICAL RESULT CALLED TO, READ BACK BY AND VERIFIED WITH: PHARMD MADISON OWEN [phone] [id number] BY J RAZZAK, [place]    Listeria monocytogenes NOT DETECTED NOT DETECTED Final   Staphylococcus species NOT DETECTED NOT DETECTED Final   Staphylococcus aureus (BCID) NOT DETECTED NOT DETECTED  Final   Staphylococcus epidermidis NOT DETECTED NOT DETECTED Final   Staphylococcus lugdunensis NOT DETECTED NOT DETECTED Final   Streptococcus species NOT DETECTED NOT DETECTED Final   Streptococcus agalactiae NOT DETECTED NOT DETECTED Final   Streptococcus pneumoniae NOT DETECTED NOT DETECTED Final   Streptococcus pyogenes NOT DETECTED NOT DETECTED Final   A.calcoaceticus-baumannii NOT DETECTED NOT DETECTED Final   Bacteroides fragilis NOT DETECTED NOT DETECTED Final   Enterobacterales NOT DETECTED NOT DETECTED Final   Enterobacter cloacae complex NOT DETECTED NOT DETECTED Final   Escherichia coli NOT DETECTED NOT DETECTED Final   Klebsiella aerogenes NOT DETECTED NOT DETECTED Final   Klebsiella oxytoca NOT DETECTED NOT DETECTED Final   Klebsiella pneumoniae NOT DETECTED NOT DETECTED Final   Proteus species NOT DETECTED NOT DETECTED Final   Salmonella species NOT DETECTED NOT DETECTED Final   Serratia marcescens NOT DETECTED NOT DETECTED Final   Haemophilus influenzae NOT DETECTED NOT DETECTED Final   Neisseria meningitidis NOT DETECTED NOT DETECTED Final   Pseudomonas aeruginosa NOT DETECTED NOT DETECTED Final   Stenotrophomonas maltophilia NOT DETECTED NOT DETECTED Final   Candida albicans NOT DETECTED NOT DETECTED Final   Candida auris NOT DETECTED NOT DETECTED Final   Candida glabrata NOT DETECTED NOT DETECTED Final   Candida krusei NOT DETECTED NOT DETECTED Final   Candida parapsilosis NOT DETECTED NOT DETECTED Final   Candida tropicalis NOT DETECTED NOT DETECTED Final   Cryptococcus neoformans/gattii NOT DETECTED NOT DETECTED Final   Vancomycin resistance NOT DETECTED NOT DETECTED Final    Comment: Performed at [facility], [address], [place], [place] [address]  Culture, blood (Routine X 2) w Reflex to ID Panel     Status: Abnormal   Collection Time: 06/03/23 12:56 AM   Specimen: BLOOD LEFT HAND  Result Value Ref Range Status   Specimen Description BLOOD LEFT  HAND  Final   Special Requests   Final    BOTTLES DRAWN AEROBIC AND ANAEROBIC Blood Culture adequate volume   Culture  Setup Time GRAM POSITIVE COCCI ANAEROBIC BOTTLE ONLY   Final   Culture (A)  Final    ENTEROCOCCUS FAECIUM SUSCEPTIBILITIES PERFORMED ON PREVIOUS CULTURE WITHIN THE LAST 5 DAYS. Performed at  [facility], [address] [place], [place], [place] [address]    Report Status 06/05/2023 FINAL  Final  Culture, blood (Routine X 2) w Reflex to ID Panel     Status: None (Preliminary result)   Collection Time: 06/04/23  7:46 AM   Specimen: BLOOD RIGHT HAND  Result Value Ref Range Status   Specimen Description BLOOD RIGHT HAND  Final   Special Requests   Final    BOTTLES DRAWN AEROBIC AND ANAEROBIC Blood Culture adequate volume   Culture   Final    NO GROWTH 3 DAYS Performed at [facility], [address] [place], [place] [address]    Report Status PENDING  Incomplete  Culture, blood (Routine X 2) w Reflex to ID Panel     Status: None (Preliminary result)   Collection Time: 06/04/23  7:48 AM   Specimen: BLOOD LEFT HAND  Result Value Ref Range Status   Specimen Description BLOOD LEFT HAND  Final   Special Requests   Final    BOTTLES DRAWN AEROBIC AND ANAEROBIC Blood Culture results may not be optimal due to an excessive volume of blood received in culture bottles   Culture   Final    NO GROWTH 3 DAYS Performed at [facility], [address] [place], [place] [address]    Report Status PENDING  Incomplete  MRSA Next Gen by PCR, Nasal     Status: None   Collection Time: 06/06/23  9:14 AM   Specimen: Nasal Mucosa; Nasal Swab  Result Value Ref Range Status   MRSA by PCR Next Gen NOT DETECTED NOT DETECTED Final    Comment: (NOTE) The GeneXpert MRSA Assay (FDA approved for NASAL specimens only), is one component of a comprehensive MRSA colonization surveillance program. It is not intended to diagnose MRSA infection nor to guide or monitor treatment for MRSA  infections. Test performance is not FDA approved in patients less than 22 years old. Performed at [facility], [address] [place], [place] [address]          Radiology Studies: DG Chest Port 1 View  Result Date: 06/06/2023 CLINICAL DATA:  Shortness of breath EXAM: PORTABLE CHEST 1 VIEW COMPARISON:  06/02/2023 FINDINGS: No significant change in AP portable chest radiograph. Cardiomegaly with left chest multi lead pacer. Diffuse bilateral interstitial pulmonary opacity superimposed upon emphysema and small bilateral pleural effusions and or pleural thickening. No new airspace opacity. IMPRESSION: 1. No significant change in AP portable chest radiograph. 2. Cardiomegaly with diffuse bilateral interstitial pulmonary opacity superimposed upon emphysema, likely edema. No new airspace opacity. 3. Small bilateral pleural effusions and or pleural thickening. Electronically Signed   By: Jearld Lesch M.D.   On: 06/06/2023 09:28        Scheduled Meds:  apixaban  2.5 mg Oral BID   aspirin EC  81 mg Oral Daily   carvedilol  6.25 mg Oral BID WC   diltiazem  120 mg Oral Daily   furosemide  40 mg Oral Daily   guaiFENesin  600 mg Oral BID   ipratropium  0.5 mg Nebulization TID   levalbuterol  1.25 mg Nebulization TID   methylPREDNISolone (SOLU-MEDROL) injection  80 mg Intravenous Q12H   montelukast  10 mg Oral QHS   pantoprazole  40 mg Oral Daily   sodium chloride flush  3 mL Intravenous Q12H   Continuous Infusions:  sodium chloride 20 mL/hr at 06/07/23 1232   ampicillin (OMNIPEN) IV 2  g (06/07/23 1718)   cefTRIAXone (ROCEPHIN)  IV 2 g (06/07/23 0839)     LOS: 5 days    Time spent: 35 minutes    Berton Mount, MD  [facility] Pager #: [phone] 7PM-7AM contact night coverage as above

## 2023-06-07 NOTE — Progress Notes (Addendum)
   Pt last night refused TEE.  Long discussion this am, and now agreeable, though it appears procedure had already been cancelled and now will have to move to next available time, which will likely be later in the week.   We will follow along for results and recommendations.   Casimiro Needle [address]" [place], PA-C  06/07/2023 9:32 AM

## 2023-06-08 DIAGNOSIS — R7881 Bacteremia: Secondary | ICD-10-CM | POA: Diagnosis not present

## 2023-06-08 DIAGNOSIS — B952 Enterococcus as the cause of diseases classified elsewhere: Secondary | ICD-10-CM | POA: Diagnosis not present

## 2023-06-08 LAB — MAGNESIUM: Magnesium: 2.3 mg/dL (ref 1.7–2.4)

## 2023-06-08 LAB — BASIC METABOLIC PANEL
Anion gap: 12 (ref 5–15)
BUN: 23 mg/dL (ref 8–23)
CO2: 28 mmol/L (ref 22–32)
Calcium: 7.7 mg/dL — ABNORMAL LOW (ref 8.9–10.3)
Chloride: 94 mmol/L — ABNORMAL LOW (ref 98–111)
Creatinine, Ser: 1.1 mg/dL — ABNORMAL HIGH (ref 0.44–1.00)
GFR, Estimated: 51 mL/min — ABNORMAL LOW (ref >60)
Glucose, Bld: 147 mg/dL — ABNORMAL HIGH (ref 70–99)
Potassium: 3.7 mmol/L (ref 3.5–5.1)
Sodium: 134 mmol/L — ABNORMAL LOW (ref 135–145)

## 2023-06-08 MED ORDER — SODIUM CHLORIDE 0.9 % IV SOLN: INTRAVENOUS | Status: DC

## 2023-06-08 NOTE — Plan of Care (Signed)

## 2023-06-08 NOTE — Plan of Care (Signed)

## 2023-06-08 NOTE — Progress Notes (Signed)
Per Doreatha Martin PA-C with EP patient is able to eat. Pt will have TEE on 06/09/23. Will place diet order in.

## 2023-06-08 NOTE — Progress Notes (Signed)
  Pt is scheduled for TEE tomorrow am at 0900.   Formal plan pending results.   She has previously been consented and does not have further questions at this time.   Casimiro Needle [address]" [place], [place]  06/08/2023 3:34 PM

## 2023-06-08 NOTE — Progress Notes (Signed)
PROGRESS NOTE    [name]  ZHY:[id number] DOB: Aug 17, 1941 DOA: 06/02/2023 PCP: Leane Call, PA-C  Outpatient Specialists:     Brief Narrative:  Patient is an 82 year old female with past medical history significant for COPD on home oxygen, coronary artery disease, carotid artery occlusion, congestive heart failure, hyperlipidemia, paroxysmal atrial tachycardia, rheumatic fever and seizure.  There is also documentation of asthma.  Patient has chronic low back pain.  Patient was admitted with hypertensive emergency (systolic blood pressure of 240 mmHg).  Documented leg edema.  Leg edema has resolved.  Blood pressure slowly improving.  06/08/2023: Awaiting TEE.  TEE is planned for tomorrow.  Blood pressure is controlled.  Continue antibiotics.  Input from EP and infectious disease team is appreciated.  Assessment & Plan:   Principal Problem:   Bacteremia due to Enterococcus Active Problems:   A-fib (HCC)   COPD with acute exacerbation (HCC)   Hypertensive emergency   Anemia   Leg swelling   Pacemaker infection (HCC)   Left lower quadrant abdominal pain   Foot lesion   Hypertensive emergency: -On presentation, systolic blood pressure of 240 mmHg. -Currently on Cardizem oral/drip, and Lasix. -Systolic blood pressure has risen up to 170 mmHg today. -Start Coreg 3.125 Mg p.o. twice daily.  Monitor respiratory symptoms closely. -Goal blood pressure should be less than 130/80 mmHg.   -Optimize pain control and alleviate any inconveniences. 06/08/2023: Optimize.    Leg swelling -Resolved. -Continue Lasix. -Monitor renal function and electrolytes.   06/04/2023: Resolved.  Bacteremia: -2 out of blood cultures said to be growing Enterococcus. -ID team will be consulted. -Patient may not have true allergy (according to the pharmacy team). -TTE. -Infectious disease team to advise if there is need to pursue TEE. -Likely, Rocephin and azithromycin will be  discontinued. 06/08/2023: For TEE tomorrow..   Anemia -Iron is 23, TIBC of 451, ferritin of 47. -B12 is 5687. -Folate level is 31.3. -Suggestive of iron deficiency anemia, possibly blood loss anemia. -Start ferrous sulfate. -GI follow-up on discharge.  Patient may need EGD and, if indicated lower GI scope as well   COPD with acute exacerbation (HCC) -Continue nebulizer treatment. -History of asthma as noted. -Continue Singulair. -Peak flow daily. -Monitor symptoms closely was on Coreg.  A-fib (HCC) -Chronic diagnosis, continue apixaban. -Rate controlled.  Constipation/gaseous abdominal distention: -Try soapsuds enema. -Laxatives. -Patient may need peripheral opiate blocker. -Minimize opiates as much as possible.   DVT prophylaxis: Apixaban. Code Status: Full code. Family Communication: Husband. Disposition Plan: Likely discharge back home tomorrow.  Patient remains inpatient.   Consultants:  None.  Procedures:  None.  Antimicrobials:  IV Rocephin discontinued IV azithromycin discontinued IV vancomycin   Subjective: -No fever or chills. -No shortness of breath.   Objective: Vitals:   06/08/23 0744 06/08/23 1440 06/08/23 1611 06/08/23 1935  BP: 139/71  (!) 137/92 136/78  Pulse: 88  79 79  Resp: 20  20 16   Temp: 97.6 F (36.4 C)  97.7 F (36.5 C) 97.7 F (36.5 C)  TempSrc: Oral  Oral Oral  SpO2: 91% 100% 93% 98%  Weight:      Height:        Intake/Output Summary (Last 24 hours) at 06/08/2023 2016 Last data filed at 06/08/2023 0746 Gross per 24 hour  Intake 305 ml  Output 600 ml  Net -295 ml   Filed Weights   06/05/23 0413 06/06/23 0617 06/07/23 0522  Weight: 54.3 kg 52.5 kg 54.6 kg    Examination:  General exam: Appears calm and comfortable. Respiratory system: Decreased air entry with wheezing. Cardiovascular system: S1 & S2 heard Gastrointestinal system: Abdomen is soft and nontender.  Central nervous system: Alert and oriented.   Patient moves all extremities.   Extremities: No leg edema.  Data Reviewed: I have personally reviewed following labs and imaging studies  CBC: Recent Labs  Lab 06/02/23 1456 06/03/23 0054 06/04/23 0100  WBC 13.9* 6.8 18.1*  NEUTROABS  --   --  15.7*  HGB 11.3* 10.1* 9.1*  HCT 36.2 32.6* 29.5*  MCV 79.0* 79.1* 79.1*  PLT 411* 359 319   Basic Metabolic Panel: Recent Labs  Lab 06/02/23 1456 06/03/23 0054 06/04/23 0100 06/06/23 0042 06/08/23 0815  NA 130* 133* 135 126* 134*  K 2.9* 4.2  4.0 4.1 3.3* 3.7  CL 86* 92* 95* 83* 94*  CO2 29 26 28 31 28   GLUCOSE 111* 212* 147* 124* 147*  BUN 12 11 21 9 23   CREATININE 1.08* 1.11* 1.26* 0.83 1.10*  CALCIUM 9.2 8.8* 8.7* 7.7* 7.7*  MG  --   --  2.0 1.8 2.3  PHOS  --   --  4.7* 2.8  --    GFR: Estimated Creatinine Clearance: 31.6 mL/min (A) (by C-G formula based on SCr of 1.1 mg/dL (H)). Liver Function Tests: Recent Labs  Lab 06/02/23 1456 06/04/23 0100  AST 23  --   ALT 16  --   ALKPHOS 56  --   BILITOT 0.9  --   PROT 6.7  --   ALBUMIN 3.6 3.1*   Recent Labs  Lab 06/02/23 1456  LIPASE 27   No results for input(s): "AMMONIA" in the last 168 hours. Coagulation Profile: Recent Labs  Lab 06/03/23 0054  INR 1.2   Cardiac Enzymes: No results for input(s): "CKTOTAL", "CKMB", "CKMBINDEX", "TROPONINI" in the last 168 hours. BNP (last 3 results) No results for input(s): "PROBNP" in the last 8760 hours. HbA1C: No results for input(s): "HGBA1C" in the last 72 hours. CBG: No results for input(s): "GLUCAP" in the last 168 hours. Lipid Profile: No results for input(s): "CHOL", "HDL", "LDLCALC", "TRIG", "CHOLHDL", "LDLDIRECT" in the last 72 hours. Thyroid Function Tests: No results for input(s): "TSH", "T4TOTAL", "FREET4", "T3FREE", "THYROIDAB" in the last 72 hours. Anemia Panel: No results for input(s): "VITAMINB12", "FOLATE", "FERRITIN", "TIBC", "IRON", "RETICCTPCT" in the last 72 hours.  Urine analysis:     Component Value Date/Time   COLORURINE STRAW (A) 06/02/2023 1637   APPEARANCEUR CLEAR 06/02/2023 1637   LABSPEC 1.005 06/02/2023 1637   PHURINE 7.0 06/02/2023 1637   GLUCOSEU NEGATIVE 06/02/2023 1637   HGBUR NEGATIVE 06/02/2023 1637   BILIRUBINUR NEGATIVE 06/02/2023 1637   KETONESUR NEGATIVE 06/02/2023 1637   PROTEINUR 30 (A) 06/02/2023 1637   UROBILINOGEN 0.2 11/11/2010 0557   NITRITE NEGATIVE 06/02/2023 1637   LEUKOCYTESUR NEGATIVE 06/02/2023 1637   Sepsis Labs: @LABRCNTIP (procalcitonin:4,lacticidven:4)  ) Recent Results (from the past 240 hour(s))  SARS Coronavirus 2 by RT PCR (hospital order, performed in [facility] hospital lab) *cepheid single result test* Anterior Nasal Swab     Status: None   Collection Time: 06/03/23 12:50 AM   Specimen: Anterior Nasal Swab  Result Value Ref Range Status   SARS Coronavirus 2 by RT PCR NEGATIVE NEGATIVE Final    Comment: Performed at [facility], [address] [place], [place] [address]  Respiratory (~20 pathogens) panel by PCR     Status: None   Collection Time: 06/03/23 12:51 AM  Specimen: Nasopharyngeal Swab; Respiratory  Result Value Ref Range Status   Adenovirus NOT DETECTED NOT DETECTED Final   Coronavirus 229E NOT DETECTED NOT DETECTED Final    Comment: (NOTE) The Coronavirus on the Respiratory Panel, DOES NOT test for the novel  Coronavirus (2019 nCoV)    Coronavirus HKU1 NOT DETECTED NOT DETECTED Final   Coronavirus NL63 NOT DETECTED NOT DETECTED Final   Coronavirus OC43 NOT DETECTED NOT DETECTED Final   Metapneumovirus NOT DETECTED NOT DETECTED Final   Rhinovirus / Enterovirus NOT DETECTED NOT DETECTED Final   Influenza A NOT DETECTED NOT DETECTED Final   Influenza B NOT DETECTED NOT DETECTED Final   Parainfluenza Virus 1 NOT DETECTED NOT DETECTED Final   Parainfluenza Virus 2 NOT DETECTED NOT DETECTED Final   Parainfluenza Virus 3 NOT DETECTED NOT DETECTED Final   Parainfluenza Virus 4 NOT DETECTED NOT  DETECTED Final   Respiratory Syncytial Virus NOT DETECTED NOT DETECTED Final   Bordetella pertussis NOT DETECTED NOT DETECTED Final   Bordetella Parapertussis NOT DETECTED NOT DETECTED Final   Chlamydophila pneumoniae NOT DETECTED NOT DETECTED Final   Mycoplasma pneumoniae NOT DETECTED NOT DETECTED Final    Comment: Performed at [facility], [address] [place], [place] [address]  Culture, blood (Routine X 2) w Reflex to ID Panel     Status: Abnormal   Collection Time: 06/03/23 12:54 AM   Specimen: BLOOD LEFT ARM  Result Value Ref Range Status   Specimen Description BLOOD LEFT ARM  Final   Special Requests   Final    BOTTLES DRAWN AEROBIC AND ANAEROBIC Blood Culture adequate volume   Culture  Setup Time   Final    GRAM POSITIVE COCCI ANAEROBIC BOTTLE ONLY CRITICAL RESULT CALLED TO, READ BACK BY AND VERIFIED WITH: PHARMD MADISON Barry Dienes [phone] 1654 BY Berline Chough, [place] Performed at [facility], [address] [place], [place] [address]    Culture ENTEROCOCCUS FAECIUM (A)  Final   Report Status 06/05/2023 FINAL  Final   Organism ID, Bacteria ENTEROCOCCUS FAECIUM  Final      Susceptibility   Enterococcus faecium - MIC*    AMPICILLIN <=2 SENSITIVE Sensitive     VANCOMYCIN <=0.5 SENSITIVE Sensitive     GENTAMICIN SYNERGY SENSITIVE Sensitive     * ENTEROCOCCUS FAECIUM  Blood Culture ID Panel (Reflexed)     Status: Abnormal   Collection Time: 06/03/23 12:54 AM  Result Value Ref Range Status   Enterococcus faecalis NOT DETECTED NOT DETECTED Final   Enterococcus Faecium DETECTED (A) NOT DETECTED Final    Comment: CRITICAL RESULT CALLED TO, READ BACK BY AND VERIFIED WITH: PHARMD MADISON OWEN [phone] 1654 BY J RAZZAK, [place]    Listeria monocytogenes NOT DETECTED NOT DETECTED Final   Staphylococcus species NOT DETECTED NOT DETECTED Final   Staphylococcus aureus (BCID) NOT DETECTED NOT DETECTED Final   Staphylococcus epidermidis NOT DETECTED NOT DETECTED Final    Staphylococcus lugdunensis NOT DETECTED NOT DETECTED Final   Streptococcus species NOT DETECTED NOT DETECTED Final   Streptococcus agalactiae NOT DETECTED NOT DETECTED Final   Streptococcus pneumoniae NOT DETECTED NOT DETECTED Final   Streptococcus pyogenes NOT DETECTED NOT DETECTED Final   A.calcoaceticus-baumannii NOT DETECTED NOT DETECTED Final   Bacteroides fragilis NOT DETECTED NOT DETECTED Final   Enterobacterales NOT DETECTED NOT DETECTED Final   Enterobacter cloacae complex NOT DETECTED NOT DETECTED Final   Escherichia coli NOT DETECTED NOT DETECTED Final   Klebsiella aerogenes NOT DETECTED NOT DETECTED  Final   Klebsiella oxytoca NOT DETECTED NOT DETECTED Final   Klebsiella pneumoniae NOT DETECTED NOT DETECTED Final   Proteus species NOT DETECTED NOT DETECTED Final   Salmonella species NOT DETECTED NOT DETECTED Final   Serratia marcescens NOT DETECTED NOT DETECTED Final   Haemophilus influenzae NOT DETECTED NOT DETECTED Final   Neisseria meningitidis NOT DETECTED NOT DETECTED Final   Pseudomonas aeruginosa NOT DETECTED NOT DETECTED Final   Stenotrophomonas maltophilia NOT DETECTED NOT DETECTED Final   Candida albicans NOT DETECTED NOT DETECTED Final   Candida auris NOT DETECTED NOT DETECTED Final   Candida glabrata NOT DETECTED NOT DETECTED Final   Candida krusei NOT DETECTED NOT DETECTED Final   Candida parapsilosis NOT DETECTED NOT DETECTED Final   Candida tropicalis NOT DETECTED NOT DETECTED Final   Cryptococcus neoformans/gattii NOT DETECTED NOT DETECTED Final   Vancomycin resistance NOT DETECTED NOT DETECTED Final    Comment: Performed at [facility], [address] [place], [place] [address]  Culture, blood (Routine X 2) w Reflex to ID Panel     Status: Abnormal   Collection Time: 06/03/23 12:56 AM   Specimen: BLOOD LEFT HAND  Result Value Ref Range Status   Specimen Description BLOOD LEFT HAND  Final   Special Requests   Final    BOTTLES DRAWN AEROBIC AND  ANAEROBIC Blood Culture adequate volume   Culture  Setup Time GRAM POSITIVE COCCI ANAEROBIC BOTTLE ONLY   Final   Culture (A)  Final    ENTEROCOCCUS FAECIUM SUSCEPTIBILITIES PERFORMED ON PREVIOUS CULTURE WITHIN THE LAST 5 DAYS. Performed at [facility], [address] [place], [place] [address]    Report Status 06/05/2023 FINAL  Final  Culture, blood (Routine X 2) w Reflex to ID Panel     Status: None (Preliminary result)   Collection Time: 06/04/23  7:46 AM   Specimen: BLOOD RIGHT HAND  Result Value Ref Range Status   Specimen Description BLOOD RIGHT HAND  Final   Special Requests   Final    BOTTLES DRAWN AEROBIC AND ANAEROBIC Blood Culture adequate volume   Culture   Final    NO GROWTH 4 DAYS Performed at [facility], [address] [place], [place] [address]    Report Status PENDING  Incomplete  Culture, blood (Routine X 2) w Reflex to ID Panel     Status: None (Preliminary result)   Collection Time: 06/04/23  7:48 AM   Specimen: BLOOD LEFT HAND  Result Value Ref Range Status   Specimen Description BLOOD LEFT HAND  Final   Special Requests   Final    BOTTLES DRAWN AEROBIC AND ANAEROBIC Blood Culture results may not be optimal due to an excessive volume of blood received in culture bottles   Culture   Final    NO GROWTH 4 DAYS Performed at [facility], [address], [place], [place] [address]    Report Status PENDING  Incomplete  MRSA Next Gen by PCR, Nasal     Status: None   Collection Time: 06/06/23  9:14 AM   Specimen: Nasal Mucosa; Nasal Swab  Result Value Ref Range Status   MRSA by PCR Next Gen NOT DETECTED NOT DETECTED Final    Comment: (NOTE) The GeneXpert MRSA Assay (FDA approved for NASAL specimens only), is one component of a comprehensive MRSA colonization surveillance program. It is not intended to diagnose MRSA infection nor to guide or monitor treatment for MRSA infections. Test performance  is not FDA approved in patients less than  3 years old. Performed at [facility], [address] [place], [place] [address]          Radiology Studies: No results found.      Scheduled Meds:  apixaban  2.5 mg Oral BID   aspirin EC  81 mg Oral Daily   carvedilol  6.25 mg Oral BID WC   diltiazem  120 mg Oral Daily   ferrous sulfate  325 mg Oral Q breakfast   furosemide  40 mg Oral Daily   guaiFENesin  600 mg Oral BID   ipratropium  0.5 mg Nebulization TID   levalbuterol  1.25 mg Nebulization TID   methylPREDNISolone (SOLU-MEDROL) injection  80 mg Intravenous Q12H   montelukast  10 mg Oral QHS   pantoprazole  40 mg Oral Daily   sodium chloride flush  3 mL Intravenous Q12H   Continuous Infusions:  sodium chloride 20 mL/hr at 06/07/23 1232   sodium chloride     ampicillin (OMNIPEN) IV 2 g (06/08/23 1713)   cefTRIAXone (ROCEPHIN)  IV 2 g (06/08/23 1017)     LOS: 6 days    Time spent: 35 minutes    Berton Mount, MD  [facility] Pager #: [phone] 7PM-7AM contact night coverage as above

## 2023-06-08 NOTE — H&P (View-Only) (Signed)
 PROGRESS NOTE    [name]  ZHY:[id number] DOB: Aug 17, 1941 DOA: 06/02/2023 PCP: Leane Call, PA-C  Outpatient Specialists:     Brief Narrative:  Patient is an 82 year old female with past medical history significant for COPD on home oxygen, coronary artery disease, carotid artery occlusion, congestive heart failure, hyperlipidemia, paroxysmal atrial tachycardia, rheumatic fever and seizure.  There is also documentation of asthma.  Patient has chronic low back pain.  Patient was admitted with hypertensive emergency (systolic blood pressure of 240 mmHg).  Documented leg edema.  Leg edema has resolved.  Blood pressure slowly improving.  06/08/2023: Awaiting TEE.  TEE is planned for tomorrow.  Blood pressure is controlled.  Continue antibiotics.  Input from EP and infectious disease team is appreciated.  Assessment & Plan:   Principal Problem:   Bacteremia due to Enterococcus Active Problems:   A-fib (HCC)   COPD with acute exacerbation (HCC)   Hypertensive emergency   Anemia   Leg swelling   Pacemaker infection (HCC)   Left lower quadrant abdominal pain   Foot lesion   Hypertensive emergency: -On presentation, systolic blood pressure of 240 mmHg. -Currently on Cardizem oral/drip, and Lasix. -Systolic blood pressure has risen up to 170 mmHg today. -Start Coreg 3.125 Mg p.o. twice daily.  Monitor respiratory symptoms closely. -Goal blood pressure should be less than 130/80 mmHg.   -Optimize pain control and alleviate any inconveniences. 06/08/2023: Optimize.    Leg swelling -Resolved. -Continue Lasix. -Monitor renal function and electrolytes.   06/04/2023: Resolved.  Bacteremia: -2 out of blood cultures said to be growing Enterococcus. -ID team will be consulted. -Patient may not have true allergy (according to the pharmacy team). -TTE. -Infectious disease team to advise if there is need to pursue TEE. -Likely, Rocephin and azithromycin will be  discontinued. 06/08/2023: For TEE tomorrow..   Anemia -Iron is 23, TIBC of 451, ferritin of 47. -B12 is 5687. -Folate level is 31.3. -Suggestive of iron deficiency anemia, possibly blood loss anemia. -Start ferrous sulfate. -GI follow-up on discharge.  Patient may need EGD and, if indicated lower GI scope as well   COPD with acute exacerbation (HCC) -Continue nebulizer treatment. -History of asthma as noted. -Continue Singulair. -Peak flow daily. -Monitor symptoms closely was on Coreg.  A-fib (HCC) -Chronic diagnosis, continue apixaban. -Rate controlled.  Constipation/gaseous abdominal distention: -Try soapsuds enema. -Laxatives. -Patient may need peripheral opiate blocker. -Minimize opiates as much as possible.   DVT prophylaxis: Apixaban. Code Status: Full code. Family Communication: Husband. Disposition Plan: Likely discharge back home tomorrow.  Patient remains inpatient.   Consultants:  None.  Procedures:  None.  Antimicrobials:  IV Rocephin discontinued IV azithromycin discontinued IV vancomycin   Subjective: -No fever or chills. -No shortness of breath.   Objective: Vitals:   06/08/23 0744 06/08/23 1440 06/08/23 1611 06/08/23 1935  BP: 139/71  (!) 137/92 136/78  Pulse: 88  79 79  Resp: 20  20 16   Temp: 97.6 F (36.4 C)  97.7 F (36.5 C) 97.7 F (36.5 C)  TempSrc: Oral  Oral Oral  SpO2: 91% 100% 93% 98%  Weight:      Height:        Intake/Output Summary (Last 24 hours) at 06/08/2023 2016 Last data filed at 06/08/2023 0746 Gross per 24 hour  Intake 305 ml  Output 600 ml  Net -295 ml   Filed Weights   06/05/23 0413 06/06/23 0617 06/07/23 0522  Weight: 54.3 kg 52.5 kg 54.6 kg    Examination:  General exam: Appears calm and comfortable. Respiratory system: Decreased air entry with wheezing. Cardiovascular system: S1 & S2 heard Gastrointestinal system: Abdomen is soft and nontender.  Central nervous system: Alert and oriented.   Patient moves all extremities.   Extremities: No leg edema.  Data Reviewed: I have personally reviewed following labs and imaging studies  CBC: Recent Labs  Lab 06/02/23 1456 06/03/23 0054 06/04/23 0100  WBC 13.9* 6.8 18.1*  NEUTROABS  --   --  15.7*  HGB 11.3* 10.1* 9.1*  HCT 36.2 32.6* 29.5*  MCV 79.0* 79.1* 79.1*  PLT 411* 359 319   Basic Metabolic Panel: Recent Labs  Lab 06/02/23 1456 06/03/23 0054 06/04/23 0100 06/06/23 0042 06/08/23 0815  NA 130* 133* 135 126* 134*  K 2.9* 4.2  4.0 4.1 3.3* 3.7  CL 86* 92* 95* 83* 94*  CO2 29 26 28 31 28   GLUCOSE 111* 212* 147* 124* 147*  BUN 12 11 21 9 23   CREATININE 1.08* 1.11* 1.26* 0.83 1.10*  CALCIUM 9.2 8.8* 8.7* 7.7* 7.7*  MG  --   --  2.0 1.8 2.3  PHOS  --   --  4.7* 2.8  --    GFR: Estimated Creatinine Clearance: 31.6 mL/min (A) (by C-G formula based on SCr of 1.1 mg/dL (H)). Liver Function Tests: Recent Labs  Lab 06/02/23 1456 06/04/23 0100  AST 23  --   ALT 16  --   ALKPHOS 56  --   BILITOT 0.9  --   PROT 6.7  --   ALBUMIN 3.6 3.1*   Recent Labs  Lab 06/02/23 1456  LIPASE 27   No results for input(s): "AMMONIA" in the last 168 hours. Coagulation Profile: Recent Labs  Lab 06/03/23 0054  INR 1.2   Cardiac Enzymes: No results for input(s): "CKTOTAL", "CKMB", "CKMBINDEX", "TROPONINI" in the last 168 hours. BNP (last 3 results) No results for input(s): "PROBNP" in the last 8760 hours. HbA1C: No results for input(s): "HGBA1C" in the last 72 hours. CBG: No results for input(s): "GLUCAP" in the last 168 hours. Lipid Profile: No results for input(s): "CHOL", "HDL", "LDLCALC", "TRIG", "CHOLHDL", "LDLDIRECT" in the last 72 hours. Thyroid Function Tests: No results for input(s): "TSH", "T4TOTAL", "FREET4", "T3FREE", "THYROIDAB" in the last 72 hours. Anemia Panel: No results for input(s): "VITAMINB12", "FOLATE", "FERRITIN", "TIBC", "IRON", "RETICCTPCT" in the last 72 hours.  Urine analysis:     Component Value Date/Time   COLORURINE STRAW (A) 06/02/2023 1637   APPEARANCEUR CLEAR 06/02/2023 1637   LABSPEC 1.005 06/02/2023 1637   PHURINE 7.0 06/02/2023 1637   GLUCOSEU NEGATIVE 06/02/2023 1637   HGBUR NEGATIVE 06/02/2023 1637   BILIRUBINUR NEGATIVE 06/02/2023 1637   KETONESUR NEGATIVE 06/02/2023 1637   PROTEINUR 30 (A) 06/02/2023 1637   UROBILINOGEN 0.2 11/11/2010 0557   NITRITE NEGATIVE 06/02/2023 1637   LEUKOCYTESUR NEGATIVE 06/02/2023 1637   Sepsis Labs: @LABRCNTIP (procalcitonin:4,lacticidven:4)  ) Recent Results (from the past 240 hour(s))  SARS Coronavirus 2 by RT PCR (hospital order, performed in [facility] hospital lab) *cepheid single result test* Anterior Nasal Swab     Status: None   Collection Time: 06/03/23 12:50 AM   Specimen: Anterior Nasal Swab  Result Value Ref Range Status   SARS Coronavirus 2 by RT PCR NEGATIVE NEGATIVE Final    Comment: Performed at [facility], [address] [place], [place] [address]  Respiratory (~20 pathogens) panel by PCR     Status: None   Collection Time: 06/03/23 12:51 AM  Specimen: Nasopharyngeal Swab; Respiratory  Result Value Ref Range Status   Adenovirus NOT DETECTED NOT DETECTED Final   Coronavirus 229E NOT DETECTED NOT DETECTED Final    Comment: (NOTE) The Coronavirus on the Respiratory Panel, DOES NOT test for the novel  Coronavirus (2019 nCoV)    Coronavirus HKU1 NOT DETECTED NOT DETECTED Final   Coronavirus NL63 NOT DETECTED NOT DETECTED Final   Coronavirus OC43 NOT DETECTED NOT DETECTED Final   Metapneumovirus NOT DETECTED NOT DETECTED Final   Rhinovirus / Enterovirus NOT DETECTED NOT DETECTED Final   Influenza A NOT DETECTED NOT DETECTED Final   Influenza B NOT DETECTED NOT DETECTED Final   Parainfluenza Virus 1 NOT DETECTED NOT DETECTED Final   Parainfluenza Virus 2 NOT DETECTED NOT DETECTED Final   Parainfluenza Virus 3 NOT DETECTED NOT DETECTED Final   Parainfluenza Virus 4 NOT DETECTED NOT  DETECTED Final   Respiratory Syncytial Virus NOT DETECTED NOT DETECTED Final   Bordetella pertussis NOT DETECTED NOT DETECTED Final   Bordetella Parapertussis NOT DETECTED NOT DETECTED Final   Chlamydophila pneumoniae NOT DETECTED NOT DETECTED Final   Mycoplasma pneumoniae NOT DETECTED NOT DETECTED Final    Comment: Performed at [facility], [address] [place], [place] [address]  Culture, blood (Routine X 2) w Reflex to ID Panel     Status: Abnormal   Collection Time: 06/03/23 12:54 AM   Specimen: BLOOD LEFT ARM  Result Value Ref Range Status   Specimen Description BLOOD LEFT ARM  Final   Special Requests   Final    BOTTLES DRAWN AEROBIC AND ANAEROBIC Blood Culture adequate volume   Culture  Setup Time   Final    GRAM POSITIVE COCCI ANAEROBIC BOTTLE ONLY CRITICAL RESULT CALLED TO, READ BACK BY AND VERIFIED WITH: PHARMD MADISON Barry Dienes [phone] 1654 BY Berline Chough, [place] Performed at [facility], [address] [place], [place] [address]    Culture ENTEROCOCCUS FAECIUM (A)  Final   Report Status 06/05/2023 FINAL  Final   Organism ID, Bacteria ENTEROCOCCUS FAECIUM  Final      Susceptibility   Enterococcus faecium - MIC*    AMPICILLIN <=2 SENSITIVE Sensitive     VANCOMYCIN <=0.5 SENSITIVE Sensitive     GENTAMICIN SYNERGY SENSITIVE Sensitive     * ENTEROCOCCUS FAECIUM  Blood Culture ID Panel (Reflexed)     Status: Abnormal   Collection Time: 06/03/23 12:54 AM  Result Value Ref Range Status   Enterococcus faecalis NOT DETECTED NOT DETECTED Final   Enterococcus Faecium DETECTED (A) NOT DETECTED Final    Comment: CRITICAL RESULT CALLED TO, READ BACK BY AND VERIFIED WITH: PHARMD MADISON OWEN [phone] 1654 BY J RAZZAK, [place]    Listeria monocytogenes NOT DETECTED NOT DETECTED Final   Staphylococcus species NOT DETECTED NOT DETECTED Final   Staphylococcus aureus (BCID) NOT DETECTED NOT DETECTED Final   Staphylococcus epidermidis NOT DETECTED NOT DETECTED Final    Staphylococcus lugdunensis NOT DETECTED NOT DETECTED Final   Streptococcus species NOT DETECTED NOT DETECTED Final   Streptococcus agalactiae NOT DETECTED NOT DETECTED Final   Streptococcus pneumoniae NOT DETECTED NOT DETECTED Final   Streptococcus pyogenes NOT DETECTED NOT DETECTED Final   A.calcoaceticus-baumannii NOT DETECTED NOT DETECTED Final   Bacteroides fragilis NOT DETECTED NOT DETECTED Final   Enterobacterales NOT DETECTED NOT DETECTED Final   Enterobacter cloacae complex NOT DETECTED NOT DETECTED Final   Escherichia coli NOT DETECTED NOT DETECTED Final   Klebsiella aerogenes NOT DETECTED NOT DETECTED  Final   Klebsiella oxytoca NOT DETECTED NOT DETECTED Final   Klebsiella pneumoniae NOT DETECTED NOT DETECTED Final   Proteus species NOT DETECTED NOT DETECTED Final   Salmonella species NOT DETECTED NOT DETECTED Final   Serratia marcescens NOT DETECTED NOT DETECTED Final   Haemophilus influenzae NOT DETECTED NOT DETECTED Final   Neisseria meningitidis NOT DETECTED NOT DETECTED Final   Pseudomonas aeruginosa NOT DETECTED NOT DETECTED Final   Stenotrophomonas maltophilia NOT DETECTED NOT DETECTED Final   Candida albicans NOT DETECTED NOT DETECTED Final   Candida auris NOT DETECTED NOT DETECTED Final   Candida glabrata NOT DETECTED NOT DETECTED Final   Candida krusei NOT DETECTED NOT DETECTED Final   Candida parapsilosis NOT DETECTED NOT DETECTED Final   Candida tropicalis NOT DETECTED NOT DETECTED Final   Cryptococcus neoformans/gattii NOT DETECTED NOT DETECTED Final   Vancomycin resistance NOT DETECTED NOT DETECTED Final    Comment: Performed at [facility], [address] [place], [place] [address]  Culture, blood (Routine X 2) w Reflex to ID Panel     Status: Abnormal   Collection Time: 06/03/23 12:56 AM   Specimen: BLOOD LEFT HAND  Result Value Ref Range Status   Specimen Description BLOOD LEFT HAND  Final   Special Requests   Final    BOTTLES DRAWN AEROBIC AND  ANAEROBIC Blood Culture adequate volume   Culture  Setup Time GRAM POSITIVE COCCI ANAEROBIC BOTTLE ONLY   Final   Culture (A)  Final    ENTEROCOCCUS FAECIUM SUSCEPTIBILITIES PERFORMED ON PREVIOUS CULTURE WITHIN THE LAST 5 DAYS. Performed at [facility], [address] [place], [place] [address]    Report Status 06/05/2023 FINAL  Final  Culture, blood (Routine X 2) w Reflex to ID Panel     Status: None (Preliminary result)   Collection Time: 06/04/23  7:46 AM   Specimen: BLOOD RIGHT HAND  Result Value Ref Range Status   Specimen Description BLOOD RIGHT HAND  Final   Special Requests   Final    BOTTLES DRAWN AEROBIC AND ANAEROBIC Blood Culture adequate volume   Culture   Final    NO GROWTH 4 DAYS Performed at [facility], [address] [place], [place] [address]    Report Status PENDING  Incomplete  Culture, blood (Routine X 2) w Reflex to ID Panel     Status: None (Preliminary result)   Collection Time: 06/04/23  7:48 AM   Specimen: BLOOD LEFT HAND  Result Value Ref Range Status   Specimen Description BLOOD LEFT HAND  Final   Special Requests   Final    BOTTLES DRAWN AEROBIC AND ANAEROBIC Blood Culture results may not be optimal due to an excessive volume of blood received in culture bottles   Culture   Final    NO GROWTH 4 DAYS Performed at [facility], [address], [place], [place] [address]    Report Status PENDING  Incomplete  MRSA Next Gen by PCR, Nasal     Status: None   Collection Time: 06/06/23  9:14 AM   Specimen: Nasal Mucosa; Nasal Swab  Result Value Ref Range Status   MRSA by PCR Next Gen NOT DETECTED NOT DETECTED Final    Comment: (NOTE) The GeneXpert MRSA Assay (FDA approved for NASAL specimens only), is one component of a comprehensive MRSA colonization surveillance program. It is not intended to diagnose MRSA infection nor to guide or monitor treatment for MRSA infections. Test performance  is not FDA approved in patients less than  3 years old. Performed at [facility], [address] [place], [place] [address]          Radiology Studies: No results found.      Scheduled Meds:  apixaban  2.5 mg Oral BID   aspirin EC  81 mg Oral Daily   carvedilol  6.25 mg Oral BID WC   diltiazem  120 mg Oral Daily   ferrous sulfate  325 mg Oral Q breakfast   furosemide  40 mg Oral Daily   guaiFENesin  600 mg Oral BID   ipratropium  0.5 mg Nebulization TID   levalbuterol  1.25 mg Nebulization TID   methylPREDNISolone (SOLU-MEDROL) injection  80 mg Intravenous Q12H   montelukast  10 mg Oral QHS   pantoprazole  40 mg Oral Daily   sodium chloride flush  3 mL Intravenous Q12H   Continuous Infusions:  sodium chloride 20 mL/hr at 06/07/23 1232   sodium chloride     ampicillin (OMNIPEN) IV 2 g (06/08/23 1713)   cefTRIAXone (ROCEPHIN)  IV 2 g (06/08/23 1017)     LOS: 6 days    Time spent: 35 minutes    Berton Mount, MD  [facility] Pager #: [phone] 7PM-7AM contact night coverage as above

## 2023-06-09 ENCOUNTER — Inpatient Hospital Stay (HOSPITAL_COMMUNITY): Payer: No Typology Code available for payment source

## 2023-06-09 ENCOUNTER — Inpatient Hospital Stay (HOSPITAL_COMMUNITY): Payer: No Typology Code available for payment source | Admitting: Registered Nurse

## 2023-06-09 ENCOUNTER — Encounter (HOSPITAL_COMMUNITY): Payer: Self-pay | Admitting: Internal Medicine

## 2023-06-09 ENCOUNTER — Encounter (HOSPITAL_COMMUNITY): Admission: EM | Disposition: E | Payer: Self-pay | Source: Home / Self Care | Attending: Internal Medicine

## 2023-06-09 ENCOUNTER — Other Ambulatory Visit: Payer: Self-pay

## 2023-06-09 DIAGNOSIS — I340 Nonrheumatic mitral (valve) insufficiency: Secondary | ICD-10-CM

## 2023-06-09 DIAGNOSIS — I110 Hypertensive heart disease with heart failure: Secondary | ICD-10-CM

## 2023-06-09 DIAGNOSIS — I4891 Unspecified atrial fibrillation: Secondary | ICD-10-CM

## 2023-06-09 DIAGNOSIS — F17210 Nicotine dependence, cigarettes, uncomplicated: Secondary | ICD-10-CM | POA: Diagnosis not present

## 2023-06-09 DIAGNOSIS — I509 Heart failure, unspecified: Secondary | ICD-10-CM

## 2023-06-09 DIAGNOSIS — B952 Enterococcus as the cause of diseases classified elsewhere: Secondary | ICD-10-CM | POA: Diagnosis not present

## 2023-06-09 DIAGNOSIS — R7881 Bacteremia: Secondary | ICD-10-CM | POA: Diagnosis not present

## 2023-06-09 DIAGNOSIS — L989 Disorder of the skin and subcutaneous tissue, unspecified: Secondary | ICD-10-CM | POA: Diagnosis not present

## 2023-06-09 DIAGNOSIS — T827XXD Infection and inflammatory reaction due to other cardiac and vascular devices, implants and grafts, subsequent encounter: Secondary | ICD-10-CM | POA: Diagnosis not present

## 2023-06-09 DIAGNOSIS — I161 Hypertensive emergency: Secondary | ICD-10-CM | POA: Diagnosis not present

## 2023-06-09 HISTORY — PX: TEE WITHOUT CARDIOVERSION: SHX5443

## 2023-06-09 LAB — CBC
HCT: 31.9 % — ABNORMAL LOW (ref 36.0–46.0)
Hemoglobin: 9.8 g/dL — ABNORMAL LOW (ref 12.0–15.0)
MCH: 24.8 pg — ABNORMAL LOW (ref 26.0–34.0)
MCHC: 30.7 g/dL (ref 30.0–36.0)
MCV: 80.8 fL (ref 80.0–100.0)
Platelets: 342 10*3/uL (ref 150–400)
RBC: 3.95 MIL/uL (ref 3.87–5.11)
RDW: 22.4 % — ABNORMAL HIGH (ref 11.5–15.5)
WBC: 14.3 10*3/uL — ABNORMAL HIGH (ref 4.0–10.5)
nRBC: 0.8 % — ABNORMAL HIGH (ref 0.0–0.2)

## 2023-06-09 LAB — BRAIN NATRIURETIC PEPTIDE: B Natriuretic Peptide: 623.5 pg/mL — ABNORMAL HIGH (ref 0.0–100.0)

## 2023-06-09 LAB — ECHO TEE

## 2023-06-09 LAB — TROPONIN I (HIGH SENSITIVITY): Troponin I (High Sensitivity): 14 ng/L (ref <18)

## 2023-06-09 SURGERY — ECHOCARDIOGRAM, TRANSESOPHAGEAL
Anesthesia: Monitor Anesthesia Care

## 2023-06-09 MED ORDER — FUROSEMIDE 40 MG PO TABS
40.0000 mg | ORAL_TABLET | Freq: Every day | ORAL | Status: DC
  Administered 2023-06-10 – 2023-06-14 (×5): 40 mg via ORAL
  Filled 2023-06-09 (×6): qty 1

## 2023-06-09 MED ORDER — PREDNISONE 20 MG PO TABS
50.0000 mg | ORAL_TABLET | Freq: Once | ORAL | Status: AC
  Administered 2023-06-09: 50 mg via ORAL
  Filled 2023-06-09: qty 1

## 2023-06-09 MED ORDER — FUROSEMIDE 10 MG/ML IJ SOLN
40.0000 mg | Freq: Two times a day (BID) | INTRAMUSCULAR | Status: AC
  Administered 2023-06-09 – 2023-06-10 (×2): 40 mg via INTRAVENOUS
  Filled 2023-06-09 (×2): qty 4

## 2023-06-09 MED ORDER — DIPHENHYDRAMINE HCL 25 MG PO CAPS: 50.0000 mg | ORAL_CAPSULE | Freq: Once | ORAL | Status: AC

## 2023-06-09 MED ORDER — PHENYLEPHRINE HCL-NACL 20-0.9 MG/250ML-% IV SOLN
INTRAVENOUS | Status: DC | PRN
  Administered 2023-06-09 (×2): 160 ug via INTRAVENOUS

## 2023-06-09 MED ORDER — DIPHENHYDRAMINE HCL 50 MG/ML IJ SOLN: 50.0000 mg | Freq: Once | INTRAMUSCULAR | Status: DC

## 2023-06-09 MED ORDER — PREDNISONE 20 MG PO TABS: 50.0000 mg | ORAL_TABLET | Freq: Four times a day (QID) | ORAL | Status: DC

## 2023-06-09 MED ORDER — DIPHENHYDRAMINE HCL 25 MG PO CAPS: 50.0000 mg | ORAL_CAPSULE | Freq: Once | ORAL | Status: DC

## 2023-06-09 MED ORDER — FUROSEMIDE 10 MG/ML IJ SOLN: 40.0000 mg | Freq: Two times a day (BID) | INTRAMUSCULAR | Status: DC

## 2023-06-09 MED ORDER — DIPHENHYDRAMINE HCL 50 MG/ML IJ SOLN
50.0000 mg | Freq: Once | INTRAMUSCULAR | Status: AC
  Administered 2023-06-09: 50 mg via INTRAVENOUS
  Filled 2023-06-09: qty 1

## 2023-06-09 MED ORDER — IOHEXOL 350 MG/ML SOLN
75.0000 mL | Freq: Once | INTRAVENOUS | Status: AC | PRN
  Administered 2023-06-09: 75 mL via INTRAVENOUS

## 2023-06-09 MED ORDER — OXYCODONE HCL 5 MG PO TABS
5.0000 mg | ORAL_TABLET | Freq: Four times a day (QID) | ORAL | Status: AC | PRN
  Administered 2023-06-09 – 2023-06-13 (×4): 5 mg via ORAL
  Filled 2023-06-09 (×4): qty 1

## 2023-06-09 MED ORDER — LIDOCAINE 2% (20 MG/ML) 5 ML SYRINGE
INTRAMUSCULAR | Status: DC | PRN
  Administered 2023-06-09: 50 mg via INTRAVENOUS

## 2023-06-09 MED ORDER — PROPOFOL 500 MG/50ML IV EMUL
INTRAVENOUS | Status: DC | PRN
  Administered 2023-06-09: 10 mg via INTRAVENOUS
  Administered 2023-06-09: 20 mg via INTRAVENOUS

## 2023-06-09 NOTE — Progress Notes (Signed)
PROGRESS NOTE    [name]  GEX:[id number] DOB: 11-20-1940 DOA: 06/02/2023 PCP: Leane Call, PA-C  Outpatient Specialists:     Brief Narrative:  Patient is an 82 year old female with past medical history significant for COPD on home oxygen, coronary artery disease, carotid artery occlusion, congestive heart failure, hyperlipidemia, paroxysmal atrial tachycardia, rheumatic fever and seizure.  There is also documentation of asthma.  Patient has chronic low back pain.  Patient was admitted with hypertensive emergency (systolic blood pressure of 240 mmHg).  Documented leg edema.  Leg edema has resolved.  Blood pressure slowly improving.  06/08/2023: Awaiting TEE.  TEE is planned for tomorrow.  Blood pressure is controlled.  Continue antibiotics.  Input from EP and infectious disease team is appreciated.  06/09/2023: Patient underwent TEE today of without significant finding.  Will proceed with CT scan of the abdomen and pelvis.  Patient has contrast dye allergy.  Patient will need to be pretreated.  Infectious disease input is appreciated.  Patient will complete antibiotics on 07/17/2023.  Mild shortness of breath reported after TEE.  Patient was noted to have chest tightness/wheezing.  Patient has COPD, is currently being treated for exacerbation.  Cardiac BNP was not elevated.  IV Lasix 40 Mg twice daily x 2 doses and reevaluate.  Continue COPD management.  Pursue disposition after reviewing CT abdomen and pelvis.  Complete course of antibiotics on discharge.  See OPAT.  Assessment & Plan:   Principal Problem:   Bacteremia due to Enterococcus Active Problems:   A-fib (HCC)   COPD with acute exacerbation (HCC)   Hypertensive emergency   Anemia   Leg swelling   Pacemaker infection (HCC)   Left lower quadrant abdominal pain   Foot lesion   Hypertensive emergency: -On presentation, systolic blood pressure of 240 mmHg. -Currently on Cardizem oral/drip, and Lasix. -Systolic blood  pressure has risen up to 170 mmHg today. -Start Coreg 3.125 Mg p.o. twice daily.  Monitor respiratory symptoms closely. -Goal blood pressure should be less than 130/80 mmHg.   -Optimize pain control and alleviate any inconveniences. 06/09/2023: Optimized    Leg swelling -Resolved. -Continue Lasix. -Monitor renal function and electrolytes.   06/04/2023: Resolved.  Bacteremia: -2 out of blood cultures said to be growing Enterococcus. -ID team will be consulted. -Patient may not have true allergy (according to the pharmacy team). -TTE. -Infectious disease team to advise if there is need to pursue TEE. -Likely, Rocephin and azithromycin will be discontinued. 06/08/2023: For TEE tomorrow.. 06/09/2023: TEE is unrevealing.  CT abdomen/pelvis pending.  Complete course of antibiotics as documented by infectious disease team.   Anemia -Iron is 23, TIBC of 451, ferritin of 47. -B12 is 5687. -Folate level is 31.3. -Suggestive of iron deficiency anemia, possibly blood loss anemia. -Start ferrous sulfate. -GI follow-up on discharge.  Patient may need EGD and, if indicated lower GI scope as well   COPD with acute exacerbation (HCC) -Continue nebulizer treatment. -History of asthma as noted. -Continue Singulair. -Peak flow daily. -Monitor symptoms closely was on Coreg. 06/09/2023: Mild exacerbation noted after TEE.  Continue to optimize management.  A-fib (HCC) -Chronic diagnosis, continue apixaban. -Rate controlled.  Constipation/gaseous abdominal distention: -Try soapsuds enema. -Laxatives. -Patient may need peripheral opiate blocker. -Minimize opiates as much as possible.   DVT prophylaxis: Apixaban. Code Status: Full code. Family Communication: Husband. Disposition Plan: Likely discharge back home tomorrow.  Patient remains inpatient.   Consultants:  None.  Procedures:  None.  Antimicrobials:  IV Rocephin discontinued IV  azithromycin discontinued IV  vancomycin   Subjective: -No fever or chills. -Mild shortness of breath reported. -Wheezing reported.     Objective: Vitals:   06/09/23 1247 06/09/23 1300 06/09/23 1526 06/09/23 1950  BP:  (!) 159/86 132/67 135/80  Pulse:  82 80 80  Resp:   20 20  Temp:   98.2 F (36.8 C) 98.4 F (36.9 C)  TempSrc:   Axillary Oral  SpO2: 93% (!) 89% 93% 93%  Weight:      Height:        Intake/Output Summary (Last 24 hours) at 06/09/2023 2030 Last data filed at 06/09/2023 0851 Gross per 24 hour  Intake 200 ml  Output 450 ml  Net -250 ml   Filed Weights   06/05/23 0413 06/06/23 0617 06/07/23 0522  Weight: 54.3 kg 52.5 kg 54.6 kg    Examination:  General exam: Appears calm and comfortable. Respiratory system: Decreased air entry with wheezing. Cardiovascular system: S1 & S2 heard Gastrointestinal system: Abdomen is soft and nontender.  Central nervous system: Alert and oriented.  Patient moves all extremities.   Extremities: No leg edema.  Data Reviewed: I have personally reviewed following labs and imaging studies  CBC: Recent Labs  Lab 06/03/23 0054 06/04/23 0100 06/09/23 1455  WBC 6.8 18.1* 14.3*  NEUTROABS  --  15.7*  --   HGB 10.1* 9.1* 9.8*  HCT 32.6* 29.5* 31.9*  MCV 79.1* 79.1* 80.8  PLT 359 319 342   Basic Metabolic Panel: Recent Labs  Lab 06/03/23 0054 06/04/23 0100 06/06/23 0042 06/08/23 0815  NA 133* 135 126* 134*  K 4.2  4.0 4.1 3.3* 3.7  CL 92* 95* 83* 94*  CO2 26 28 31 28   GLUCOSE 212* 147* 124* 147*  BUN 11 21 9 23   CREATININE 1.11* 1.26* 0.83 1.10*  CALCIUM 8.8* 8.7* 7.7* 7.7*  MG  --  2.0 1.8 2.3  PHOS  --  4.7* 2.8  --    GFR: Estimated Creatinine Clearance: 31.6 mL/min (A) (by C-G formula based on SCr of 1.1 mg/dL (H)). Liver Function Tests: Recent Labs  Lab 06/04/23 0100  ALBUMIN 3.1*   No results for input(s): "LIPASE", "AMYLASE" in the last 168 hours.  No results for input(s): "AMMONIA" in the last 168 hours. Coagulation  Profile: Recent Labs  Lab 06/03/23 0054  INR 1.2   Cardiac Enzymes: No results for input(s): "CKTOTAL", "CKMB", "CKMBINDEX", "TROPONINI" in the last 168 hours. BNP (last 3 results) No results for input(s): "PROBNP" in the last 8760 hours. HbA1C: No results for input(s): "HGBA1C" in the last 72 hours. CBG: No results for input(s): "GLUCAP" in the last 168 hours. Lipid Profile: No results for input(s): "CHOL", "HDL", "LDLCALC", "TRIG", "CHOLHDL", "LDLDIRECT" in the last 72 hours. Thyroid Function Tests: No results for input(s): "TSH", "T4TOTAL", "FREET4", "T3FREE", "THYROIDAB" in the last 72 hours. Anemia Panel: No results for input(s): "VITAMINB12", "FOLATE", "FERRITIN", "TIBC", "IRON", "RETICCTPCT" in the last 72 hours.  Urine analysis:    Component Value Date/Time   COLORURINE STRAW (A) 06/02/2023 1637   APPEARANCEUR CLEAR 06/02/2023 1637   LABSPEC 1.005 06/02/2023 1637   PHURINE 7.0 06/02/2023 1637   GLUCOSEU NEGATIVE 06/02/2023 1637   HGBUR NEGATIVE 06/02/2023 1637   BILIRUBINUR NEGATIVE 06/02/2023 1637   KETONESUR NEGATIVE 06/02/2023 1637   PROTEINUR 30 (A) 06/02/2023 1637   UROBILINOGEN 0.2 11/11/2010 0557   NITRITE NEGATIVE 06/02/2023 1637   LEUKOCYTESUR NEGATIVE 06/02/2023 1637   Sepsis Labs: @LABRCNTIP (procalcitonin:4,lacticidven:4)  ) Recent Results (from  the past 240 hour(s))  SARS Coronavirus 2 by RT PCR (hospital order, performed in [facility] hospital lab) *cepheid single result test* Anterior Nasal Swab     Status: None   Collection Time: 06/03/23 12:50 AM   Specimen: Anterior Nasal Swab  Result Value Ref Range Status   SARS Coronavirus 2 by RT PCR NEGATIVE NEGATIVE Final    Comment: Performed at [facility], [address], [place], [place] [address]  Respiratory (~20 pathogens) panel by PCR     Status: None   Collection Time: 06/03/23 12:51 AM   Specimen: Nasopharyngeal Swab; Respiratory  Result Value Ref Range Status   Adenovirus NOT  DETECTED NOT DETECTED Final   Coronavirus 229E NOT DETECTED NOT DETECTED Final    Comment: (NOTE) The Coronavirus on the Respiratory Panel, DOES NOT test for the novel  Coronavirus (2019 nCoV)    Coronavirus HKU1 NOT DETECTED NOT DETECTED Final   Coronavirus NL63 NOT DETECTED NOT DETECTED Final   Coronavirus OC43 NOT DETECTED NOT DETECTED Final   Metapneumovirus NOT DETECTED NOT DETECTED Final   Rhinovirus / Enterovirus NOT DETECTED NOT DETECTED Final   Influenza A NOT DETECTED NOT DETECTED Final   Influenza B NOT DETECTED NOT DETECTED Final   Parainfluenza Virus 1 NOT DETECTED NOT DETECTED Final   Parainfluenza Virus 2 NOT DETECTED NOT DETECTED Final   Parainfluenza Virus 3 NOT DETECTED NOT DETECTED Final   Parainfluenza Virus 4 NOT DETECTED NOT DETECTED Final   Respiratory Syncytial Virus NOT DETECTED NOT DETECTED Final   Bordetella pertussis NOT DETECTED NOT DETECTED Final   Bordetella Parapertussis NOT DETECTED NOT DETECTED Final   Chlamydophila pneumoniae NOT DETECTED NOT DETECTED Final   Mycoplasma pneumoniae NOT DETECTED NOT DETECTED Final    Comment: Performed at [facility], [address] [place], [place] [address]  Culture, blood (Routine X 2) w Reflex to ID Panel     Status: Abnormal   Collection Time: 06/03/23 12:54 AM   Specimen: BLOOD LEFT ARM  Result Value Ref Range Status   Specimen Description BLOOD LEFT ARM  Final   Special Requests   Final    BOTTLES DRAWN AEROBIC AND ANAEROBIC Blood Culture adequate volume   Culture  Setup Time   Final    GRAM POSITIVE COCCI ANAEROBIC BOTTLE ONLY CRITICAL RESULT CALLED TO, READ BACK BY AND VERIFIED WITH: PHARMD MADISON Barry Dienes [phone] 1654 BY Berline Chough, [place] Performed at [facility], [address] [place], [place] [address]    Culture ENTEROCOCCUS FAECIUM (A)  Final   Report Status 06/05/2023 FINAL  Final   Organism ID, Bacteria ENTEROCOCCUS FAECIUM  Final      Susceptibility   Enterococcus faecium - MIC*     AMPICILLIN <=2 SENSITIVE Sensitive     VANCOMYCIN <=0.5 SENSITIVE Sensitive     GENTAMICIN SYNERGY SENSITIVE Sensitive     * ENTEROCOCCUS FAECIUM  Blood Culture ID Panel (Reflexed)     Status: Abnormal   Collection Time: 06/03/23 12:54 AM  Result Value Ref Range Status   Enterococcus faecalis NOT DETECTED NOT DETECTED Final   Enterococcus Faecium DETECTED (A) NOT DETECTED Final    Comment: CRITICAL RESULT CALLED TO, READ BACK BY AND VERIFIED WITH: PHARMD MADISON OWEN [phone] 1654 BY J RAZZAK, MT    Listeria monocytogenes NOT DETECTED NOT DETECTED Final   Staphylococcus species NOT DETECTED NOT DETECTED Final   Staphylococcus aureus (BCID) NOT DETECTED NOT DETECTED Final   Staphylococcus epidermidis NOT  DETECTED NOT DETECTED Final   Staphylococcus lugdunensis NOT DETECTED NOT DETECTED Final   Streptococcus species NOT DETECTED NOT DETECTED Final   Streptococcus agalactiae NOT DETECTED NOT DETECTED Final   Streptococcus pneumoniae NOT DETECTED NOT DETECTED Final   Streptococcus pyogenes NOT DETECTED NOT DETECTED Final   A.calcoaceticus-baumannii NOT DETECTED NOT DETECTED Final   Bacteroides fragilis NOT DETECTED NOT DETECTED Final   Enterobacterales NOT DETECTED NOT DETECTED Final   Enterobacter cloacae complex NOT DETECTED NOT DETECTED Final   Escherichia coli NOT DETECTED NOT DETECTED Final   Klebsiella aerogenes NOT DETECTED NOT DETECTED Final   Klebsiella oxytoca NOT DETECTED NOT DETECTED Final   Klebsiella pneumoniae NOT DETECTED NOT DETECTED Final   Proteus species NOT DETECTED NOT DETECTED Final   Salmonella species NOT DETECTED NOT DETECTED Final   Serratia marcescens NOT DETECTED NOT DETECTED Final   Haemophilus influenzae NOT DETECTED NOT DETECTED Final   Neisseria meningitidis NOT DETECTED NOT DETECTED Final   Pseudomonas aeruginosa NOT DETECTED NOT DETECTED Final   Stenotrophomonas maltophilia NOT DETECTED NOT DETECTED Final   Candida albicans NOT DETECTED NOT DETECTED  Final   Candida auris NOT DETECTED NOT DETECTED Final   Candida glabrata NOT DETECTED NOT DETECTED Final   Candida krusei NOT DETECTED NOT DETECTED Final   Candida parapsilosis NOT DETECTED NOT DETECTED Final   Candida tropicalis NOT DETECTED NOT DETECTED Final   Cryptococcus neoformans/gattii NOT DETECTED NOT DETECTED Final   Vancomycin resistance NOT DETECTED NOT DETECTED Final    Comment: Performed at [facility], [address] [place], [place] [address]  Culture, blood (Routine X 2) w Reflex to ID Panel     Status: Abnormal   Collection Time: 06/03/23 12:56 AM   Specimen: BLOOD LEFT HAND  Result Value Ref Range Status   Specimen Description BLOOD LEFT HAND  Final   Special Requests   Final    BOTTLES DRAWN AEROBIC AND ANAEROBIC Blood Culture adequate volume   Culture  Setup Time GRAM POSITIVE COCCI ANAEROBIC BOTTLE ONLY   Final   Culture (A)  Final    ENTEROCOCCUS FAECIUM SUSCEPTIBILITIES PERFORMED ON PREVIOUS CULTURE WITHIN THE LAST 5 DAYS. Performed at [facility], [address] [place], [place] [address]    Report Status 06/05/2023 FINAL  Final  Culture, blood (Routine X 2) w Reflex to ID Panel     Status: None   Collection Time: 06/04/23  7:46 AM   Specimen: BLOOD RIGHT HAND  Result Value Ref Range Status   Specimen Description BLOOD RIGHT HAND  Final   Special Requests   Final    BOTTLES DRAWN AEROBIC AND ANAEROBIC Blood Culture adequate volume   Culture   Final    NO GROWTH 5 DAYS Performed at [facility], [address] [place], [place] [address]    Report Status 06/09/2023 FINAL  Final  Culture, blood (Routine X 2) w Reflex to ID Panel     Status: None   Collection Time: 06/04/23  7:48 AM   Specimen: BLOOD LEFT HAND  Result Value Ref Range Status   Specimen Description BLOOD LEFT HAND  Final   Special Requests   Final    BOTTLES DRAWN AEROBIC AND ANAEROBIC Blood Culture results may not be optimal due to an excessive volume of blood  received in culture bottles   Culture   Final    NO GROWTH 5 DAYS Performed at [facility], [address] [place], [place]  [address]    Report Status 06/09/2023 FINAL  Final  MRSA Next Gen by PCR, Nasal     Status: None   Collection Time: 06/06/23  9:14 AM   Specimen: Nasal Mucosa; Nasal Swab  Result Value Ref Range Status   MRSA by PCR Next Gen NOT DETECTED NOT DETECTED Final    Comment: (NOTE) The GeneXpert MRSA Assay (FDA approved for NASAL specimens only), is one component of a comprehensive MRSA colonization surveillance program. It is not intended to diagnose MRSA infection nor to guide or monitor treatment for MRSA infections. Test performance is not FDA approved in patients less than 75 years old. Performed at [facility], [address], [place], [place] [address]          Radiology Studies: [place] EKG SITE RITE  Result Date: 06/09/2023 If Site Rite image not attached, placement could not be confirmed due to current cardiac rhythm.  DG CHEST PORT 1 VIEW  Result Date: 06/09/2023 CLINICAL DATA:  Aspiration into airway EXAM: PORTABLE CHEST 1 VIEW COMPARISON:  Chest x-ray 06/06/2023. FINDINGS: Similar small left pleural effusion with overlying left basilar opacities. No visible pneumothorax. Similar cardiomediastinal silhouette. Left subclavian approach cardiac rhythm is device. IMPRESSION: Similar small left pleural effusion with overlying left basilar atelectasis and/or consolidation. Electronically Signed   By: Feliberto Harts M.D.   On: 06/09/2023 15:31   ECHO TEE  Result Date: 06/09/2023    TRANSESOPHOGEAL ECHO REPORT   Patient Name:   [name] Date of Exam: 06/09/2023 Medical Rec #:  [id number]          Height:       60.0 in Accession #:    [id number]         Weight:       120.4 lb Date of Birth:  1941-05-28           BSA:          1.504 m Patient Age:    81 years           BP:           137/89 mmHg Patient Gender: F                  HR:            80 bpm. Exam Location:  Inpatient Procedure: Transesophageal Echo, Color Doppler, Cardiac Doppler and 3D Echo Indications:     Bacteremia  History:         Patient has prior history of Echocardiogram examinations, most                  recent 06/04/2023. CHF, CAD, Pacemaker, COPD and history of                  Rheumatic fever, Arrythmias:Atrial Fibrillation,                  Signs/Symptoms:Edema; Risk Factors:Hypertension, Dyslipidemia                  and Current Smoker.  Sonographer:     Delcie Roch RDCS Referring Phys:  [id number] Graciella Freer Diagnosing Phys: Lennie Odor MD PROCEDURE: After discussion of the risks and benefits of a TEE, an informed consent was obtained from the patient. TEE procedure time was 7 minutes. The transesophogeal probe was passed without difficulty through the esophogus of the patient. Imaged were  obtained with the patient in a left lateral decubitus position. Sedation performed  by different physician. The patient was monitored while under deep sedation. Anesthestetic sedation was provided intravenously by Anesthesiology: 30mg  of Propofol, 50mg  of Lidocaine. Image quality was excellent. The patient's vital signs; including heart rate, blood pressure, and oxygen saturation; remained stable throughout the procedure. The patient developed no complications during the procedure.  IMPRESSIONS  1. No evidence of endocarditis on the ppm leads.  2. Left ventricular ejection fraction, by estimation, is 55 to 60%. The left ventricle has normal function. There is the interventricular septum is flattened in systole and diastole, consistent with right ventricular pressure and volume overload.  3. Right ventricular systolic function is mildly reduced. The right ventricular size is moderately enlarged.  4. Left atrial size was severely dilated. No left atrial/left atrial appendage thrombus was detected.  5. Right atrial size was severely dilated.  6. The mitral valve is normal in  structure. Mild to moderate mitral valve regurgitation. No evidence of mitral stenosis.  7. Severe, torrential TR. Septal leaflet appears impinged on 3D assessment with dilation of the posterior annulus and incomplete coaptation. RA bows into the LA, consistent with elevated RA pressure. The tricuspid valve is abnormal. Tricuspid valve regurgitation is severe.  8. The aortic valve is tricuspid. Aortic valve regurgitation is not visualized. No aortic stenosis is present.  9. There is mild (Grade II) layered plaque involving the descending aorta. Conclusion(s)/Recommendation(s): No evidence of vegetation/infective endocarditis on this transesophageael echocardiogram. FINDINGS  Left Ventricle: Left ventricular ejection fraction, by estimation, is 55 to 60%. The left ventricle has normal function. The left ventricular internal cavity size was normal in size. The interventricular septum is flattened in systole and diastole, consistent with right ventricular pressure and volume overload. Right Ventricle: The right ventricular size is moderately enlarged. No increase in right ventricular wall thickness. Right ventricular systolic function is mildly reduced. Left Atrium: Left atrial size was severely dilated. No left atrial/left atrial appendage thrombus was detected. Right Atrium: Right atrial size was severely dilated. Pericardium: There is no evidence of pericardial effusion. Mitral Valve: The mitral valve is normal in structure. Mild to moderate mitral valve regurgitation. No evidence of mitral valve stenosis. There is no evidence of mitral valve vegetation. Tricuspid Valve: Severe, torrential TR. Septal leaflet appears impinged on 3D assessment with dilation of the posterior annulus and incomplete coaptation. RA bows into the LA, consistent with elevated RA pressure. The tricuspid valve is abnormal. Tricuspid valve regurgitation is severe. No evidence of tricuspid stenosis. There is no evidence of tricuspid valve  vegetation. Aortic Valve: The aortic valve is tricuspid. Aortic valve regurgitation is not visualized. No aortic stenosis is present. There is no evidence of aortic valve vegetation. Pulmonic Valve: The pulmonic valve was normal in structure. Pulmonic valve regurgitation is trivial. No evidence of pulmonic stenosis. There is no evidence of pulmonic valve vegetation. Aorta: The aortic root and ascending aorta are structurally normal, with no evidence of dilitation. There is mild (Grade II) layered plaque involving the descending aorta. IAS/Shunts: There is left bowing of the interatrial septum, suggestive of elevated right atrial pressure. No atrial level shunt detected by color flow Doppler. Additional Comments: A device lead is visualized in the right atrium and right ventricle.  AORTA Ao Root diam: 2.95 cm Ao Asc diam:  2.96 cm Lennie Odor MD Electronically signed by Lennie Odor MD Signature Date/Time: 06/09/2023/9:33:42 AM    Final    EP STUDY  Result Date: 06/09/2023 See surgical note for result.       Scheduled Meds:  apixaban  2.5 mg Oral BID   aspirin EC  81 mg Oral Daily   carvedilol  6.25 mg Oral BID WC   diltiazem  120 mg Oral Daily   diphenhydrAMINE  50 mg Oral Once   Or   diphenhydrAMINE  50 mg Intravenous Once   ferrous sulfate  325 mg Oral Q breakfast   [START ON 06/10/2023] furosemide  40 mg Intravenous BID   furosemide  40 mg Oral Daily   guaiFENesin  600 mg Oral BID   ipratropium  0.5 mg Nebulization TID   levalbuterol  1.25 mg Nebulization TID   methylPREDNISolone (SOLU-MEDROL) injection  80 mg Intravenous Q12H   montelukast  10 mg Oral QHS   pantoprazole  40 mg Oral Daily   sodium chloride flush  3 mL Intravenous Q12H   Continuous Infusions:  ampicillin (OMNIPEN) IV 2 g (06/09/23 1423)     LOS: 7 days    Time spent: 35 minutes    Berton Mount, MD  [facility] Pager #: [phone] 7PM-7AM contact night coverage as above

## 2023-06-09 NOTE — CV Procedure (Signed)
    TRANSESOPHAGEAL ECHOCARDIOGRAM   NAME:  [name]    MRN: [id number] DOB:  1940-12-12    ADMIT DATE: 06/02/2023  INDICATIONS: Bacteremia  PROCEDURE:   Informed consent was obtained prior to the procedure. The risks, benefits and alternatives for the procedure were discussed and the patient comprehended these risks.  Risks include, but are not limited to, cough, sore throat, vomiting, nausea, somnolence, esophageal and stomach trauma or perforation, bleeding, low blood pressure, aspiration, pneumonia, infection, trauma to the teeth and death.    Procedural time out performed. The oropharynx was anesthetized with topical 1% benzocaine.    Anesthesia was administered by Dr. Hyacinth Meeker.  The patient was administered 30 mg of propofol and 50 mg of lidocaine to achieve and maintain moderate conscious sedation.  The patient's heart rate, blood pressure, and oxygen saturation are monitored continuously during the procedure. The period of conscious sedation is 9 minutes, of which I was present face-to-face 100% of this time.   The transesophageal probe was inserted in the esophagus and stomach without difficulty and multiple views were obtained.   COMPLICATIONS:    There were no immediate complications.  KEY FINDINGS:  No evidence of endocarditis.  Severe, torrential TR.  Normal LV function.  Moderate RV dilation with moderately reduced RV function.  Full report to follow. Further management per primary team.   Melinda Spore T. Flora Lipps, MD, [facility]  [facility]  [address], [address] [place], [place] [address] [phone]  8:56 AM

## 2023-06-09 NOTE — Progress Notes (Signed)
Subjective:   No new complaints   Antibiotics:  Anti-infectives (From admission, onward)    Start     Dose/Rate Route Frequency Ordered Stop   06/05/23 2200  cefTRIAXone (ROCEPHIN) 2 g in sodium chloride 0.9 % 100 mL IVPB  Status:  Discontinued        2 g 200 mL/hr over 30 Minutes Intravenous Every 12 hours 06/05/23 1507 06/09/23 1057   06/05/23 1800  vancomycin (VANCOCIN) IVPB 1000 mg/200 mL premix  Status:  Discontinued        1,000 mg 200 mL/hr over 60 Minutes Intravenous Every 48 hours 06/03/23 1918 06/05/23 1507   06/05/23 1800  ampicillin (OMNIPEN) 2 g in sodium chloride 0.9 % 100 mL IVPB        2 g 300 mL/hr over 20 Minutes Intravenous Every 6 hours 06/05/23 1507     06/05/23 1030  amoxicillin (AMOXIL) capsule 500 mg        500 mg Oral  Once 06/05/23 0943 06/05/23 1013   06/03/23 1930  vancomycin (VANCOREADY) IVPB 1250 mg/250 mL        1,250 mg 166.7 mL/hr over 90 Minutes Intravenous  Once 06/03/23 1835 06/03/23 2212   06/02/23 2300  cefTRIAXone (ROCEPHIN) 1 g in sodium chloride 0.9 % 100 mL IVPB  Status:  Discontinued       Note to Pharmacy: Patient has tolerated amoxicillin in the past. She has taken cefdinir. Her reaction was only to augmentin (I believe from clavulante acid)   1 g 200 mL/hr over 30 Minutes Intravenous Every 24 hours 06/02/23 2257 06/03/23 1840   06/02/23 2300  azithromycin (ZITHROMAX) 500 mg in sodium chloride 0.9 % 250 mL IVPB  Status:  Discontinued        500 mg 250 mL/hr over 60 Minutes Intravenous Every 24 hours 06/02/23 2257 06/03/23 1840       Medications: Scheduled Meds:  apixaban  2.5 mg Oral BID   aspirin EC  81 mg Oral Daily   carvedilol  6.25 mg Oral BID WC   diltiazem  120 mg Oral Daily   diphenhydrAMINE  50 mg Oral Once   Or   diphenhydrAMINE  50 mg Intravenous Once   ferrous sulfate  325 mg Oral Q breakfast   furosemide  40 mg Oral Daily   guaiFENesin  600 mg Oral BID   ipratropium  0.5 mg Nebulization TID    levalbuterol  1.25 mg Nebulization TID   methylPREDNISolone (SOLU-MEDROL) injection  80 mg Intravenous Q12H   montelukast  10 mg Oral QHS   pantoprazole  40 mg Oral Daily   sodium chloride flush  3 mL Intravenous Q12H   Continuous Infusions:  ampicillin (OMNIPEN) IV 2 g (06/09/23 1423)   PRN Meds:.acetaminophen **OR** acetaminophen, diltiazem, EPINEPHrine, hydrALAZINE, hydrOXYzine, levalbuterol, melatonin, menthol-cetylpyridinium, naLOXone (NARCAN)  injection, ondansetron (ZOFRAN) IV, mouth rinse, oxyCODONE, polyethylene glycol    Objective: Weight change:   Intake/Output Summary (Last 24 hours) at 06/09/2023 1711 Last data filed at 06/09/2023 0851 Gross per 24 hour  Intake 200 ml  Output 450 ml  Net -250 ml   Blood pressure 132/67, pulse 80, temperature 98.2 F (36.8 C), temperature source Axillary, resp. rate 20, height 5' (1.524 m), weight 54.6 kg, SpO2 93%. Temp:  [97.7 F (36.5 C)-98.3 F (36.8 C)] 98.2 F (36.8 C) (08/16 1526) Pulse Rate:  [73-86] 80 (08/16 1526) Resp:  [16-24] 20 (08/16 1526) BP: (125-159)/(67-92) 132/67 (08/16 1526) SpO2:  [  89 %-100 %] 93 % (08/16 1526)  Physical Exam: Physical Exam Constitutional:      General: She is not in acute distress.    Appearance: She is well-developed. She is not diaphoretic.  HENT:     Head: Normocephalic and atraumatic.     Right Ear: External ear normal.     Left Ear: External ear normal.     Mouth/Throat:     Pharynx: No oropharyngeal exudate.  Eyes:     General: No scleral icterus.    Conjunctiva/sclera: Conjunctivae normal.     Pupils: Pupils are equal, round, and reactive to light.  Cardiovascular:     Rate and Rhythm: Normal rate and regular rhythm.  Pulmonary:     Effort: Pulmonary effort is normal. No respiratory distress.     Breath sounds: No wheezing.  Abdominal:     General: Bowel sounds are normal. There is no distension.     Palpations: Abdomen is soft.     Tenderness: There is no abdominal  tenderness. There is no rebound.  Musculoskeletal:        General: No tenderness. Normal range of motion.  Lymphadenopathy:     Cervical: No cervical adenopathy.  Skin:    General: Skin is warm and dry.     Coloration: Skin is not pale.     Findings: No erythema or rash.  Neurological:     General: No focal deficit present.     Mental Status: She is alert and oriented to person, place, and time.     Motor: No abnormal muscle tone.     Coordination: Coordination normal.  Psychiatric:        Mood and Affect: Mood normal.        Behavior: Behavior normal.        Thought Content: Thought content normal.        Judgment: Judgment normal.      CBC:    BMET Recent Labs    06/08/23 0815  NA 134*  K 3.7  CL 94*  CO2 28  GLUCOSE 147*  BUN 23  CREATININE 1.10*  CALCIUM 7.7*     Liver Panel  No results for input(s): "PROT", "ALBUMIN", "AST", "ALT", "ALKPHOS", "BILITOT", "BILIDIR", "IBILI" in the last 72 hours.      Sedimentation Rate No results for input(s): "ESRSEDRATE" in the last 72 hours. C-Reactive Protein No results for input(s): "CRP" in the last 72 hours.  Micro Results: Recent Results (from the past 720 hour(s))  SARS Coronavirus 2 by RT PCR (hospital order, performed in [facility] hospital lab) *cepheid single result test* Anterior Nasal Swab     Status: None   Collection Time: 06/03/23 12:50 AM   Specimen: Anterior Nasal Swab  Result Value Ref Range Status   SARS Coronavirus 2 by RT PCR NEGATIVE NEGATIVE Final    Comment: Performed at [facility], [address], [place], [place] [address]  Respiratory (~20 pathogens) panel by PCR     Status: None   Collection Time: 06/03/23 12:51 AM   Specimen: Nasopharyngeal Swab; Respiratory  Result Value Ref Range Status   Adenovirus NOT DETECTED NOT DETECTED Final   Coronavirus 229E NOT DETECTED NOT DETECTED Final    Comment: (NOTE) The Coronavirus on the Respiratory Panel, DOES NOT test for the novel   Coronavirus (2019 nCoV)    Coronavirus HKU1 NOT DETECTED NOT DETECTED Final   Coronavirus NL63 NOT DETECTED NOT DETECTED Final   Coronavirus OC43 NOT DETECTED NOT  DETECTED Final   Metapneumovirus NOT DETECTED NOT DETECTED Final   Rhinovirus / Enterovirus NOT DETECTED NOT DETECTED Final   Influenza A NOT DETECTED NOT DETECTED Final   Influenza B NOT DETECTED NOT DETECTED Final   Parainfluenza Virus 1 NOT DETECTED NOT DETECTED Final   Parainfluenza Virus 2 NOT DETECTED NOT DETECTED Final   Parainfluenza Virus 3 NOT DETECTED NOT DETECTED Final   Parainfluenza Virus 4 NOT DETECTED NOT DETECTED Final   Respiratory Syncytial Virus NOT DETECTED NOT DETECTED Final   Bordetella pertussis NOT DETECTED NOT DETECTED Final   Bordetella Parapertussis NOT DETECTED NOT DETECTED Final   Chlamydophila pneumoniae NOT DETECTED NOT DETECTED Final   Mycoplasma pneumoniae NOT DETECTED NOT DETECTED Final    Comment: Performed at [facility], [address], [place], [place] [address]  Culture, blood (Routine X 2) w Reflex to ID Panel     Status: Abnormal   Collection Time: 06/03/23 12:54 AM   Specimen: BLOOD LEFT ARM  Result Value Ref Range Status   Specimen Description BLOOD LEFT ARM  Final   Special Requests   Final    BOTTLES DRAWN AEROBIC AND ANAEROBIC Blood Culture adequate volume   Culture  Setup Time   Final    GRAM POSITIVE COCCI ANAEROBIC BOTTLE ONLY CRITICAL RESULT CALLED TO, READ BACK BY AND VERIFIED WITH: PHARMD MADISON Barry Dienes [phone] [id number] BY Berline Chough, [place] Performed at [facility], [address], [place], [place] [address]    Culture ENTEROCOCCUS FAECIUM (A)  Final   Report Status 06/05/2023 FINAL  Final   Organism ID, Bacteria ENTEROCOCCUS FAECIUM  Final      Susceptibility   Enterococcus faecium - MIC*    AMPICILLIN <=2 SENSITIVE Sensitive     VANCOMYCIN <=0.5 SENSITIVE Sensitive     GENTAMICIN SYNERGY SENSITIVE Sensitive     * ENTEROCOCCUS FAECIUM  Blood Culture ID  Panel (Reflexed)     Status: Abnormal   Collection Time: 06/03/23 12:54 AM  Result Value Ref Range Status   Enterococcus faecalis NOT DETECTED NOT DETECTED Final   Enterococcus Faecium DETECTED (A) NOT DETECTED Final    Comment: CRITICAL RESULT CALLED TO, READ BACK BY AND VERIFIED WITH: PHARMD MADISON OWEN [phone] [id number] BY J RAZZAK, MT    Listeria monocytogenes NOT DETECTED NOT DETECTED Final   Staphylococcus species NOT DETECTED NOT DETECTED Final   Staphylococcus aureus (BCID) NOT DETECTED NOT DETECTED Final   Staphylococcus epidermidis NOT DETECTED NOT DETECTED Final   Staphylococcus lugdunensis NOT DETECTED NOT DETECTED Final   Streptococcus species NOT DETECTED NOT DETECTED Final   Streptococcus agalactiae NOT DETECTED NOT DETECTED Final   Streptococcus pneumoniae NOT DETECTED NOT DETECTED Final   Streptococcus pyogenes NOT DETECTED NOT DETECTED Final   A.calcoaceticus-baumannii NOT DETECTED NOT DETECTED Final   Bacteroides fragilis NOT DETECTED NOT DETECTED Final   Enterobacterales NOT DETECTED NOT DETECTED Final   Enterobacter cloacae complex NOT DETECTED NOT DETECTED Final   Escherichia coli NOT DETECTED NOT DETECTED Final   Klebsiella aerogenes NOT DETECTED NOT DETECTED Final   Klebsiella oxytoca NOT DETECTED NOT DETECTED Final   Klebsiella pneumoniae NOT DETECTED NOT DETECTED Final   Proteus species NOT DETECTED NOT DETECTED Final   Salmonella species NOT DETECTED NOT DETECTED Final   Serratia marcescens NOT DETECTED NOT DETECTED Final   Haemophilus influenzae NOT DETECTED NOT DETECTED Final   Neisseria meningitidis NOT DETECTED NOT DETECTED Final   Pseudomonas aeruginosa NOT DETECTED NOT DETECTED Final  Stenotrophomonas maltophilia NOT DETECTED NOT DETECTED Final   Candida albicans NOT DETECTED NOT DETECTED Final   Candida auris NOT DETECTED NOT DETECTED Final   Candida glabrata NOT DETECTED NOT DETECTED Final   Candida krusei NOT DETECTED NOT DETECTED Final   Candida  parapsilosis NOT DETECTED NOT DETECTED Final   Candida tropicalis NOT DETECTED NOT DETECTED Final   Cryptococcus neoformans/gattii NOT DETECTED NOT DETECTED Final   Vancomycin resistance NOT DETECTED NOT DETECTED Final    Comment: Performed at [facility], [address], [place], [place] [address]  Culture, blood (Routine X 2) w Reflex to ID Panel     Status: Abnormal   Collection Time: 06/03/23 12:56 AM   Specimen: BLOOD LEFT HAND  Result Value Ref Range Status   Specimen Description BLOOD LEFT HAND  Final   Special Requests   Final    BOTTLES DRAWN AEROBIC AND ANAEROBIC Blood Culture adequate volume   Culture  Setup Time GRAM POSITIVE COCCI ANAEROBIC BOTTLE ONLY   Final   Culture (A)  Final    ENTEROCOCCUS FAECIUM SUSCEPTIBILITIES PERFORMED ON PREVIOUS CULTURE WITHIN THE LAST 5 DAYS. Performed at [facility], [address], [place], [place] [address]    Report Status 06/05/2023 FINAL  Final  Culture, blood (Routine X 2) w Reflex to ID Panel     Status: None   Collection Time: 06/04/23  7:46 AM   Specimen: BLOOD RIGHT HAND  Result Value Ref Range Status   Specimen Description BLOOD RIGHT HAND  Final   Special Requests   Final    BOTTLES DRAWN AEROBIC AND ANAEROBIC Blood Culture adequate volume   Culture   Final    NO GROWTH 5 DAYS Performed at [facility], [address] [place], [place] [address]    Report Status 06/09/2023 FINAL  Final  Culture, blood (Routine X 2) w Reflex to ID Panel     Status: None   Collection Time: 06/04/23  7:48 AM   Specimen: BLOOD LEFT HAND  Result Value Ref Range Status   Specimen Description BLOOD LEFT HAND  Final   Special Requests   Final    BOTTLES DRAWN AEROBIC AND ANAEROBIC Blood Culture results may not be optimal due to an excessive volume of blood received in culture bottles   Culture   Final    NO GROWTH 5 DAYS Performed at [facility], [address] [place], [place] [address]    Report Status  06/09/2023 FINAL  Final  MRSA Next Gen by PCR, Nasal     Status: None   Collection Time: 06/06/23  9:14 AM   Specimen: Nasal Mucosa; Nasal Swab  Result Value Ref Range Status   MRSA by PCR Next Gen NOT DETECTED NOT DETECTED Final    Comment: (NOTE) The GeneXpert MRSA Assay (FDA approved for NASAL specimens only), is one component of a comprehensive MRSA colonization surveillance program. It is not intended to diagnose MRSA infection nor to guide or monitor treatment for MRSA infections. Test performance is not FDA approved in patients less than 30 years old. Performed at [facility], [address], [place], [place] [address]     Studies/Results: [place] CHEST PORT 1 VIEW  Result Date: 06/09/2023 CLINICAL DATA:  Aspiration into airway EXAM: PORTABLE CHEST 1 VIEW COMPARISON:  Chest x-ray 06/06/2023. FINDINGS: Similar small left pleural effusion with overlying left basilar opacities. No visible pneumothorax. Similar cardiomediastinal silhouette. Left subclavian approach cardiac rhythm  is device. IMPRESSION: Similar small left pleural effusion with overlying left basilar atelectasis and/or consolidation. Electronically Signed   By: Feliberto Harts M.D.   On: 06/09/2023 15:31   ECHO TEE  Result Date: 06/09/2023    TRANSESOPHOGEAL ECHO REPORT   Patient Name:   [name] Date of Exam: 06/09/2023 Medical Rec #:  [id number]          Height:       60.0 in Accession #:    [id number]         Weight:       120.4 lb Date of Birth:  Jul 13, 1941           BSA:          1.504 m Patient Age:    81 years           BP:           137/89 mmHg Patient Gender: F                  HR:           80 bpm. Exam Location:  Inpatient Procedure: Transesophageal Echo, Color Doppler, Cardiac Doppler and 3D Echo Indications:     Bacteremia  History:         Patient has prior history of Echocardiogram examinations, most                  recent 06/04/2023. CHF, CAD, Pacemaker, COPD and history of                   Rheumatic fever, Arrythmias:Atrial Fibrillation,                  Signs/Symptoms:Edema; Risk Factors:Hypertension, Dyslipidemia                  and Current Smoker.  Sonographer:     Delcie Roch RDCS Referring Phys:  [id number] Graciella Freer Diagnosing Phys: Lennie Odor MD PROCEDURE: After discussion of the risks and benefits of a TEE, an informed consent was obtained from the patient. TEE procedure time was 7 minutes. The transesophogeal probe was passed without difficulty through the esophogus of the patient. Imaged were  obtained with the patient in a left lateral decubitus position. Sedation performed by different physician. The patient was monitored while under deep sedation. Anesthestetic sedation was provided intravenously by Anesthesiology: 30mg  of Propofol, 50mg  of Lidocaine. Image quality was excellent. The patient's vital signs; including heart rate, blood pressure, and oxygen saturation; remained stable throughout the procedure. The patient developed no complications during the procedure.  IMPRESSIONS  1. No evidence of endocarditis on the ppm leads.  2. Left ventricular ejection fraction, by estimation, is 55 to 60%. The left ventricle has normal function. There is the interventricular septum is flattened in systole and diastole, consistent with right ventricular pressure and volume overload.  3. Right ventricular systolic function is mildly reduced. The right ventricular size is moderately enlarged.  4. Left atrial size was severely dilated. No left atrial/left atrial appendage thrombus was detected.  5. Right atrial size was severely dilated.  6. The mitral valve is normal in structure. Mild to moderate mitral valve regurgitation. No evidence of mitral stenosis.  7. Severe, torrential TR. Septal leaflet appears impinged on 3D assessment with dilation of the posterior annulus and incomplete coaptation. RA bows into the LA, consistent with elevated RA pressure. The tricuspid valve is  abnormal. Tricuspid valve regurgitation is severe.  8. The  aortic valve is tricuspid. Aortic valve regurgitation is not visualized. No aortic stenosis is present.  9. There is mild (Grade II) layered plaque involving the descending aorta. Conclusion(s)/Recommendation(s): No evidence of vegetation/infective endocarditis on this transesophageael echocardiogram. FINDINGS  Left Ventricle: Left ventricular ejection fraction, by estimation, is 55 to 60%. The left ventricle has normal function. The left ventricular internal cavity size was normal in size. The interventricular septum is flattened in systole and diastole, consistent with right ventricular pressure and volume overload. Right Ventricle: The right ventricular size is moderately enlarged. No increase in right ventricular wall thickness. Right ventricular systolic function is mildly reduced. Left Atrium: Left atrial size was severely dilated. No left atrial/left atrial appendage thrombus was detected. Right Atrium: Right atrial size was severely dilated. Pericardium: There is no evidence of pericardial effusion. Mitral Valve: The mitral valve is normal in structure. Mild to moderate mitral valve regurgitation. No evidence of mitral valve stenosis. There is no evidence of mitral valve vegetation. Tricuspid Valve: Severe, torrential TR. Septal leaflet appears impinged on 3D assessment with dilation of the posterior annulus and incomplete coaptation. RA bows into the LA, consistent with elevated RA pressure. The tricuspid valve is abnormal. Tricuspid valve regurgitation is severe. No evidence of tricuspid stenosis. There is no evidence of tricuspid valve vegetation. Aortic Valve: The aortic valve is tricuspid. Aortic valve regurgitation is not visualized. No aortic stenosis is present. There is no evidence of aortic valve vegetation. Pulmonic Valve: The pulmonic valve was normal in structure. Pulmonic valve regurgitation is trivial. No evidence of pulmonic stenosis.  There is no evidence of pulmonic valve vegetation. Aorta: The aortic root and ascending aorta are structurally normal, with no evidence of dilitation. There is mild (Grade II) layered plaque involving the descending aorta. IAS/Shunts: There is left bowing of the interatrial septum, suggestive of elevated right atrial pressure. No atrial level shunt detected by color flow Doppler. Additional Comments: A device lead is visualized in the right atrium and right ventricle.  AORTA Ao Root diam: 2.95 cm Ao Asc diam:  2.96 cm Lennie Odor MD Electronically signed by Lennie Odor MD Signature Date/Time: 06/09/2023/9:33:42 AM    Final    EP STUDY  Result Date: 06/09/2023 See surgical note for result.     Assessment/Plan:  INTERVAL HISTORY: TEE without evidence of endocarditis  Principal Problem:   Bacteremia due to Enterococcus Active Problems:   A-fib (HCC)   COPD with acute exacerbation (HCC)   Hypertensive emergency   Anemia   Leg swelling   Pacemaker infection (HCC)   Left lower quadrant abdominal pain   Foot lesion    [name] is a 81 y.o. 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 Her urine analysis was without pyuria making urine not likely to be the source  #1 AMP S E faecium bacteremia  She has been switched to a dose ampicillin and ceftriaxone  TEE is pending to assess her PM and heart valves  Lumbar and thoracic spine fails to show evidence of discitis or vertebral osteomyelitis I would also like to CT abdomen pelvis to exclude intra-abdominal source of infection  I do think we should get CT abdomen and pelvis to exclude abdominal source of infection since we do not know the source of her bacteremia.  I also think that she best served by her going home with 6 weeks of IV antibiotics.  I will plan on seeing her in  clinic in checking surveillance blood cultures.  Diagnosis: Enterococcus faecalis  bacteremia   Culture Result: Enterococcus faecalis  Allergies  Allergen Reactions   Augmentin [Amoxicillin-Pot Clavulanate] Shortness Of Breath and Dermatitis    Patient has tolerated amoxicillin as well as cefdinir in the past.  Several times.  Husband/patient feels that her allergy is to the other agent and Augmentin, namely clavulanic acid   Codeine Shortness Of Breath   Pravastatin Other (See Comments)    Give muscle aches and weakness    Sulfa Antibiotics    Contrast Media [Iodinated Contrast Media] Rash    Itching and rash   Metrizamide Rash    Itching and rash    OPAT Orders Discharge antibiotics to be given via PICC line Discharge antibiotics: High-dose IV ampicillin via continuous infusion Duration: 6 weeks End Date: 07/17/2023  [facility] Per Protocol:  Home health RN for IV administration and teaching; PICC line care and labs.    Labs weekly while on IV antibiotics: _x_ CBC with differential _x_ BMP __ CMP  _s_ Please pull PIC at completion of IV antibiotics __ Please leave PIC in place until doctor has seen patient or been notified  Fax weekly labs to [phone]  Clinic Follow Up Appt:   THANA HEEB has an appointment on 07/17/2023 at 345PM with Dr. Daiva Eves at  [facility], which  is located in the [facility] at  [address] in [place].  [address], which is located to the left of the elevators.  Phone: [phone]  Fax: [phone]  [email or web address]  The patient should arrive 30 minutes prior to their appoitment.   I have personally spent 52 minutes involved in face-to-face and non-face-to-face activities for this patient on the day of the visit. Professional time spent includes the following activities: Preparing to see the patient (review of tests), Obtaining and/or reviewing separately obtained history (admission/discharge record), Performing a medically  appropriate examination and/or evaluation , Ordering medications/tests/procedures, referring and communicating with other health care professionals, Documenting clinical information in the EMR, Independently interpreting results (not separately reported), Communicating results to the patient/family/caregiver, Counseling and educating the patient/family/caregiver and Care coordination (not separately reported).   Dr Renold Don is available for questions this weekend and will followup on results on CT abdomen and pelvis.  If this study is negative then can proceed with DC with OPAT.    LOS: 7 days   Acey Lav 06/09/2023, 5:11 PM

## 2023-06-09 NOTE — Progress Notes (Signed)
Patient Name: [name] Date of Encounter: 06/09/2023  Primary Cardiologist: None Electrophysiologist: Dr. Rudolpho Sevin  Interval Summary   TEE today showed no evidence of endocarditis on the PPM leads. 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   Feeling Ok this am. Relieved to not immediately need extraction. Understands would potentially need in the future if she becomes infected again  Inpatient Medications    Scheduled Meds:  apixaban  2.5 mg Oral BID   aspirin EC  81 mg Oral Daily   carvedilol  6.25 mg Oral BID WC   diltiazem  120 mg Oral Daily   ferrous sulfate  325 mg Oral Q breakfast   furosemide  40 mg Oral Daily   guaiFENesin  600 mg Oral BID   ipratropium  0.5 mg Nebulization TID   levalbuterol  1.25 mg Nebulization TID   methylPREDNISolone (SOLU-MEDROL) injection  80 mg Intravenous Q12H   montelukast  10 mg Oral QHS   pantoprazole  40 mg Oral Daily   sodium chloride flush  3 mL Intravenous Q12H   Continuous Infusions:  ampicillin (OMNIPEN) IV 2 g (06/09/23 0537)   PRN Meds: acetaminophen **OR** acetaminophen, diltiazem, EPINEPHrine, hydrALAZINE, hydrOXYzine, levalbuterol, melatonin, menthol-cetylpyridinium, naLOXone (NARCAN)  injection, ondansetron (ZOFRAN) IV, mouth rinse, polyethylene glycol   Vital Signs    Vitals:   06/09/23 0610 06/09/23 0803 06/09/23 0807 06/09/23 0858  BP: 138/83  (!) 155/92   Pulse: 85  86   Resp: 20  (!) 24   Temp: 97.8 F (36.6 C) 98.3 F (36.8 C)  98.2 F (36.8 C)  TempSrc: Oral Temporal  Temporal  SpO2: 95%  91%   Weight:      Height:        Intake/Output Summary (Last 24 hours) at 06/09/2023 1114 Last data filed at 06/09/2023 0851 Gross per 24 hour  Intake 200 ml  Output 450 ml  Net -250 ml   Filed Weights   06/05/23 0413 06/06/23 0617 06/07/23 0522  Weight: 54.3 kg 52.5 kg 54.6 kg    Physical Exam    GEN- The patient is well appearing, alert and oriented x 3 today.   Lungs- Clear to  ausculation bilaterally, normal work of breathing Cardiac- Regular rate and rhythm, no rubs or gallops GI- soft, NT, ND, + BS Extremities- no clubbing or cyanosis. No edema  Telemetry    V pacing 80s (personally reviewed)  Hospital Course    [name] is a 82 y.o. 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   Assessment & Plan    Tachy-brady syndrome s/p MDT "BiV" Left bundle lead in LV port, 5076 RV. Implanted in setting of AV nodal ablation 07/13/2022 TEE today without endocarditis.  No plans for extraction at this time.  OK to follow up with her chronic EP team.    Permanent AF Continue eliquis for now. Potentially switch to heparin later today pending MRI/CT of back.  S/p multiple "ablations" per pt Appears SVT / pathway ablated 2005 and 2007 and not felt to be AF ablation candidate with COPD.    Back pain With recent injection and was pending CT.  Ideally would get an MRI to r/o osteomyelitis/discitis though unfortunately 3830 MDT lead in LV port/LBBB position is  not currently on label for MRI.  CT without evidence of osteomyelitis.    COPD On O2. Limits candidacy for advanced procedures.    Bacteremia Has Enterococcus faecium, as yet with unclear source.  Does have RLE, recent facial cellulitis by history, as well as recent back injection.  Dysuria also noted at OV 05/19/2023. UA with protein, rare bacteria, and straw colored here.  No evidence of spinal osteomyelitis on CT Plan for abdominal imaging to further evaluate for a source.  ID following.  TEE without endocarditis. No plans for extraction at this time.   EP to see as needed while remains here.   For questions or updates, please contact [facility] Please consult  [email or web address] for contact info under Cardiology/STEMI.  Signed, Graciella Freer, PA-C  06/09/2023, 11:14 AM

## 2023-06-09 NOTE — Plan of Care (Signed)

## 2023-06-09 NOTE — Anesthesia Preprocedure Evaluation (Signed)
Anesthesia Evaluation  Patient identified by MRN, date of birth, ID band Patient awake    Reviewed: Allergy & Precautions, NPO status , Patient's Chart, lab work & pertinent test results  History of Anesthesia Complications (+) PONV and history of anesthetic complications  Airway Mallampati: I  TM Distance: >3 FB Neck ROM: Full    Dental  (+) Edentulous Upper, Poor Dentition, Missing, Dental Advisory Given   Pulmonary shortness of breath, asthma , COPD,  COPD inhaler, Current Smoker and Patient abstained from smoking.   breath sounds clear to auscultation       Cardiovascular hypertension, Pt. on medications (-) angina + CAD, + Peripheral Vascular Disease and +CHF   Rhythm:Regular Rate:Normal  8/17 Stress test: normal 6/17 EHO: normal   Neuro/Psych  Headaches, Seizures -, Well Controlled,   Anxiety        GI/Hepatic Neg liver ROS,GERD  Controlled,,  Endo/Other  negative endocrine ROS    Renal/GU negative Renal ROS     Musculoskeletal  (+) Arthritis , Osteoarthritis,    Abdominal   Peds  Hematology  (+) Blood dyscrasia, anemia   Anesthesia Other Findings   Reproductive/Obstetrics                             Anesthesia Physical Anesthesia Plan  ASA: III  Anesthesia Plan: MAC   Post-op Pain Management: Minimal or no pain anticipated   Induction: Intravenous  PONV Risk Score and Plan: 2 and Propofol infusion and Treatment may vary due to age or medical condition  Airway Management Planned: Nasal Cannula  Additional Equipment:   Intra-op Plan:   Post-operative Plan:   Informed Consent: I have reviewed the patients History and Physical, chart, labs and discussed the procedure including the risks, benefits and alternatives for the proposed anesthesia with the patient or authorized representative who has indicated his/her understanding and acceptance.     Dental advisory  given  Plan Discussed with: Surgeon and CRNA  Anesthesia Plan Comments:         Anesthesia Quick Evaluation

## 2023-06-09 NOTE — Anesthesia Postprocedure Evaluation (Signed)
Anesthesia Post Note  Patient: [name]  Procedure(s) Performed: TRANSESOPHAGEAL ECHOCARDIOGRAM     Patient location during evaluation: PACU Anesthesia Type: MAC Level of consciousness: awake and alert Pain management: pain level controlled Vital Signs Assessment: post-procedure vital signs reviewed and stable Respiratory status: spontaneous breathing, nonlabored ventilation and respiratory function stable Cardiovascular status: blood pressure returned to baseline and stable Postop Assessment: no apparent nausea or vomiting Anesthetic complications: no   No notable events documented.  Last Vitals:  Vitals:   06/09/23 0807 06/09/23 0858  BP: (!) 155/92   Pulse: 86   Resp: (!) 24   Temp:  36.8 C  SpO2: 91%     Last Pain:  Vitals:   06/09/23 0858  TempSrc: Temporal  PainSc: 0-No pain                 Lowella Curb

## 2023-06-09 NOTE — Progress Notes (Signed)
  Echocardiogram Echocardiogram Transesophageal has been performed.  Delcie Roch 06/09/2023, 9:18 AM

## 2023-06-09 NOTE — Transfer of Care (Signed)
Immediate Anesthesia Transfer of Care Note  Patient: [name]  Procedure(s) Performed: TRANSESOPHAGEAL ECHOCARDIOGRAM  Patient Location: Cath Lab  Anesthesia Type:MAC  Level of Consciousness: awake  Airway & Oxygen Therapy: Patient Spontanous Breathing and Patient connected to nasal cannula oxygen  Post-op Assessment: Report given to RN and Post -op Vital signs reviewed and stable  Post vital signs: Reviewed and stable  Last Vitals:  Vitals Value Taken Time  BP 125/71 06/09/23 0900  Temp 36.8 C 06/09/23 0858  Pulse 80 06/09/23 0902  Resp 20 06/09/23 0902  SpO2 89 % 06/09/23 0902  Vitals shown include unfiled device data.  Last Pain:  Vitals:   06/09/23 0858  TempSrc: Temporal  PainSc: 0-No pain      Patients Stated Pain Goal: 0 (06/09/23 0803)  Complications: No notable events documented.

## 2023-06-09 NOTE — Interval H&P Note (Signed)
History and Physical Interval Note:  06/09/2023 7:51 AM  [name]  has presented today for surgery, with the diagnosis of bacteremia.  The various methods of treatment have been discussed with the patient and family. After consideration of risks, benefits and other options for treatment, the patient has consented to  Procedure(s): TRANSESOPHAGEAL ECHOCARDIOGRAM (N/A) as a surgical intervention.  The patient's history has been reviewed, patient examined, no change in status, stable for surgery.  I have reviewed the patient's chart and labs.  Questions were answered to the patient's satisfaction.    NPO for TEE for bacteremia.   Gerri Spore T. Flora Lipps, MD, [facility]  [facility]  [address] [place], [place] [address] [phone]  7:51 AM

## 2023-06-09 NOTE — Consult Note (Signed)
PHARMACY CONSULT NOTE FOR:  OUTPATIENT  PARENTERAL ANTIBIOTIC THERAPY (OPAT)  Indication: Enterococcus faecium bacteremia Regimen: Ampicillin 12g IV daily as continuous infusion End date: 07/17/2023  IV antibiotic discharge orders are pended. To discharging provider:  please sign these orders via discharge navigator,  Select New Orders & click on the button choice - Manage This Unsigned Work.     Thank you for allowing pharmacy to be a part of this patient's care.  [name] 06/09/2023, 5:28 PM

## 2023-06-10 ENCOUNTER — Inpatient Hospital Stay (HOSPITAL_COMMUNITY): Payer: No Typology Code available for payment source

## 2023-06-10 DIAGNOSIS — B952 Enterococcus as the cause of diseases classified elsewhere: Secondary | ICD-10-CM | POA: Diagnosis not present

## 2023-06-10 DIAGNOSIS — R7881 Bacteremia: Secondary | ICD-10-CM | POA: Diagnosis not present

## 2023-06-10 LAB — CBC
HCT: 30.2 % — ABNORMAL LOW (ref 36.0–46.0)
Hemoglobin: 9.4 g/dL — ABNORMAL LOW (ref 12.0–15.0)
MCH: 24.2 pg — ABNORMAL LOW (ref 26.0–34.0)
MCHC: 31.1 g/dL (ref 30.0–36.0)
MCV: 77.6 fL — ABNORMAL LOW (ref 80.0–100.0)
Platelets: 285 10*3/uL (ref 150–400)
RBC: 3.89 MIL/uL (ref 3.87–5.11)
RDW: 22.5 % — ABNORMAL HIGH (ref 11.5–15.5)
WBC: 9.5 10*3/uL (ref 4.0–10.5)
nRBC: 0.4 % — ABNORMAL HIGH (ref 0.0–0.2)

## 2023-06-10 LAB — BASIC METABOLIC PANEL
Anion gap: 13 (ref 5–15)
Anion gap: 12 (ref 5–15)
BUN: 27 mg/dL — ABNORMAL HIGH (ref 8–23)
BUN: 28 mg/dL — ABNORMAL HIGH (ref 8–23)
CO2: 33 mmol/L — ABNORMAL HIGH (ref 22–32)
CO2: 33 mmol/L — ABNORMAL HIGH (ref 22–32)
Calcium: 7.4 mg/dL — ABNORMAL LOW (ref 8.9–10.3)
Calcium: 7.5 mg/dL — ABNORMAL LOW (ref 8.9–10.3)
Chloride: 89 mmol/L — ABNORMAL LOW (ref 98–111)
Chloride: 91 mmol/L — ABNORMAL LOW (ref 98–111)
Creatinine, Ser: 0.97 mg/dL (ref 0.44–1.00)
Creatinine, Ser: 1.04 mg/dL — ABNORMAL HIGH (ref 0.44–1.00)
GFR, Estimated: 59 mL/min — ABNORMAL LOW (ref >60)
GFR, Estimated: 54 mL/min — ABNORMAL LOW (ref >60)
Glucose, Bld: 154 mg/dL — ABNORMAL HIGH (ref 70–99)
Glucose, Bld: 161 mg/dL — ABNORMAL HIGH (ref 70–99)
Potassium: 2.9 mmol/L — ABNORMAL LOW (ref 3.5–5.1)
Potassium: 5.1 mmol/L (ref 3.5–5.1)
Sodium: 135 mmol/L (ref 135–145)
Sodium: 136 mmol/L (ref 135–145)

## 2023-06-10 LAB — MAGNESIUM
Magnesium: 2.2 mg/dL (ref 1.7–2.4)
Magnesium: 2.1 mg/dL (ref 1.7–2.4)

## 2023-06-10 LAB — GLUCOSE, CAPILLARY: Glucose-Capillary: 220 mg/dL — ABNORMAL HIGH (ref 70–99)

## 2023-06-10 MED ORDER — CHLORHEXIDINE GLUCONATE CLOTH 2 % EX PADS
6.0000 | MEDICATED_PAD | Freq: Every day | CUTANEOUS | Status: DC
  Administered 2023-06-11 – 2023-06-17 (×7): 6 via TOPICAL

## 2023-06-10 MED ORDER — POTASSIUM CHLORIDE 20 MEQ PO PACK
80.0000 meq | PACK | Freq: Once | ORAL | Status: AC
  Administered 2023-06-10: 80 meq via ORAL
  Filled 2023-06-10: qty 4

## 2023-06-10 MED ORDER — SODIUM CHLORIDE 0.9% FLUSH: 10.0000 mL | INTRAVENOUS | Status: DC | PRN

## 2023-06-10 MED ORDER — SODIUM CHLORIDE 0.9% FLUSH
10.0000 mL | Freq: Two times a day (BID) | INTRAVENOUS | Status: DC
  Administered 2023-06-10 – 2023-06-17 (×10): 10 mL

## 2023-06-10 MED ORDER — PREDNISONE 20 MG PO TABS
40.0000 mg | ORAL_TABLET | Freq: Every day | ORAL | Status: DC
  Administered 2023-06-10: 40 mg via ORAL
  Filled 2023-06-10: qty 2

## 2023-06-10 NOTE — Progress Notes (Signed)
Seen patient's husband  multiple times adjusting the supplemental oxygen to 2 lpm. Education given not to adjust the flow at all. He acknowledged. Informed CN Shanell and she spoke to the relative too.

## 2023-06-10 NOTE — Progress Notes (Signed)
Ct without abdominal pathology   See my prior note. Will sign off. Please call w further questions

## 2023-06-10 NOTE — Progress Notes (Signed)
PROGRESS NOTE    [name]  EXB:[id number] DOB: [birth year] DOA: 06/02/2023 PCP: Leane Call, PA-C  Outpatient Specialists:     Brief Narrative:  Patient is an 82 year old female with past medical history significant for COPD on home oxygen, coronary artery disease, carotid artery occlusion, congestive heart failure, hyperlipidemia, paroxysmal atrial tachycardia, rheumatic fever and seizure.  There is also documentation of asthma.  Patient has chronic low back pain.  Patient was admitted with hypertensive emergency (systolic blood pressure of 240 mmHg).  Documented leg edema.  Leg edema has resolved.  Blood pressure slowly improving.  06/08/2023: Awaiting TEE.  TEE is planned for tomorrow.  Blood pressure is controlled.  Continue antibiotics.  Input from EP and infectious disease team is appreciated.  06/09/2023: Patient underwent TEE today of without significant finding.  Will proceed with CT scan of the abdomen and pelvis.  Patient has contrast dye allergy.  Patient will need to be pretreated.  Infectious disease input is appreciated.  Patient will complete antibiotics on 07/17/2023.  Mild shortness of breath reported after TEE.  Patient was noted to have chest tightness/wheezing.  Patient has COPD, is currently being treated for exacerbation.  Cardiac BNP was not elevated.  IV Lasix 40 Mg twice daily x 2 doses and reevaluate.  Continue COPD management.  Pursue disposition after reviewing CT abdomen and pelvis.  Complete course of antibiotics on discharge.  See OPAT.  8/17: CT abd/pelvis unremarkable. insurance has not approved OPAT.  Assessment & Plan:   Principal Problem:   Bacteremia due to Enterococcus Active Problems:   A-fib (HCC)   COPD with acute exacerbation (HCC)   Hypertensive emergency   Anemia   Leg swelling   Pacemaker infection (HCC)   Left lower quadrant abdominal pain   Foot lesion   Hypertensive emergency: On presentation, systolic blood pressure of 240  mmHg was started on Cardizem oral/drip. Likely 2/2 back pain.  -Start Coreg 3.125 Mg p.o. twice daily. -diltizem 120 mg once daily -Goal blood pressure should be less than 130/80 mmHg.   -Optimize pain control and alleviate any inconveniences. 06/09/2023: Optimized    Leg swelling Resolved. -Continue Lasix. -Monitor renal function and electrolytes.    Bacteremia: 2 out of blood cultures said to be growing Enterococcus, etiology unknown. CT abd/pelvis normal, TEE ruled out infected leads.  -ID teamconsulted, recommending 6 weeks of OPAT -insurance still approving OPAT -PICC lined placed 8/17  Anemia -Iron is 23, TIBC of 451, ferritin of 47. -B12 is 5687. -Folate level is 31.3. -Suggestive of iron deficiency anemia, possibly blood loss anemia. -Start ferrous sulfate. -GI follow-up on discharge.  Patient may need EGD and, if indicated lower GI scope as well   COPD with acute exacerbation (HCC) On 2 L O2 at home -Continue nebulizer treatment. -History of asthma as noted. -Continue Singulair, xopenex scheduled -Peak flow daily. -Monitor symptoms closely was on Coreg. 06/09/2023: Mild exacerbation noted after TEE. Pred 40 mg once daily x 5 days,   A-fib (HCC) -Chronic diagnosis, continue apixaban 2.5 mg BID -Rate controlled.  Constipation/gaseous abdominal distention: -Try soapsuds enema. -Laxatives. -Patient may need peripheral opiate blocker. -Minimize opiates as much as possible.   DVT prophylaxis: Apixaban. Code Status: Full code. Family Communication: Husband. Disposition Plan: Likely discharge back home tomorrow.  Patient remains inpatient.   Consultants:  None.  Procedures:  None.  Antimicrobials:  IV Rocephin discontinued IV azithromycin discontinued IV vancomycin   Subjective:  Wants to go home. Reports SOB.   Objective:  Vitals:   06/10/23 0600 06/10/23 0757 06/10/23 0916 06/10/23 1559  BP:    135/84  Pulse:  84  80  Resp:  20  20  Temp:    98  F (36.7 C)  TempSrc:    Oral  SpO2: 95% 94% 100% 91%  Weight:      Height:        Intake/Output Summary (Last 24 hours) at 06/10/2023 1740 Last data filed at 06/10/2023 1602 Gross per 24 hour  Intake 240 ml  Output 3150 ml  Net -2910 ml   Filed Weights   06/06/23 0617 06/07/23 0522 06/10/23 0529  Weight: 52.5 kg 54.6 kg 54.2 kg    Examination:  General exam: Appears calm and comfortable. Respiratory system: Decreased air entry with wheezing. Cardiovascular system: S1 & S2 heard Gastrointestinal system: Abdomen is soft and nontender.  Central nervous system: Alert and oriented.  Patient moves all extremities.   Extremities: No leg edema.  Data Reviewed: I have personally reviewed following labs and imaging studies  CBC: Recent Labs  Lab 06/04/23 0100 06/09/23 1455 06/10/23 0239  WBC 18.1* 14.3* 9.5  NEUTROABS 15.7*  --   --   HGB 9.1* 9.8* 9.4*  HCT 29.5* 31.9* 30.2*  MCV 79.1* 80.8 77.6*  PLT 319 342 285   Basic Metabolic Panel: Recent Labs  Lab 06/04/23 0100 06/06/23 0042 06/08/23 0815 06/10/23 0239 06/10/23 0645  NA 135 126* 134* 135 136  K 4.1 3.3* 3.7 2.9* 5.1  CL 95* 83* 94* 89* 91*  CO2 28 31 28  33* 33*  GLUCOSE 147* 124* 147* 154* 161*  BUN 21 9 23  27* 28*  CREATININE 1.26* 0.83 1.10* 0.97 1.04*  CALCIUM 8.7* 7.7* 7.7* 7.4* 7.5*  MG 2.0 1.8 2.3 2.2 2.1  PHOS 4.7* 2.8  --   --   --    GFR: Estimated Creatinine Clearance: 31 mL/min (A) (by C-G formula based on SCr of 1.04 mg/dL (H)). Liver Function Tests: Recent Labs  Lab 06/04/23 0100  ALBUMIN 3.1*   No results for input(s): "LIPASE", "AMYLASE" in the last 168 hours.  No results for input(s): "AMMONIA" in the last 168 hours. Coagulation Profile: No results for input(s): "INR", "PROTIME" in the last 168 hours.  Cardiac Enzymes: No results for input(s): "CKTOTAL", "CKMB", "CKMBINDEX", "TROPONINI" in the last 168 hours. BNP (last 3 results) No results for input(s): "PROBNP" in the last  8760 hours. HbA1C: No results for input(s): "HGBA1C" in the last 72 hours. CBG: No results for input(s): "GLUCAP" in the last 168 hours. Lipid Profile: No results for input(s): "CHOL", "HDL", "LDLCALC", "TRIG", "CHOLHDL", "LDLDIRECT" in the last 72 hours. Thyroid Function Tests: No results for input(s): "TSH", "T4TOTAL", "FREET4", "T3FREE", "THYROIDAB" in the last 72 hours. Anemia Panel: No results for input(s): "VITAMINB12", "FOLATE", "FERRITIN", "TIBC", "IRON", "RETICCTPCT" in the last 72 hours.  Urine analysis:    Component Value Date/Time   COLORURINE STRAW (A) 06/02/2023 1637   APPEARANCEUR CLEAR 06/02/2023 1637   LABSPEC 1.005 06/02/2023 1637   PHURINE 7.0 06/02/2023 1637   GLUCOSEU NEGATIVE 06/02/2023 1637   HGBUR NEGATIVE 06/02/2023 1637   BILIRUBINUR NEGATIVE 06/02/2023 1637   KETONESUR NEGATIVE 06/02/2023 1637   PROTEINUR 30 (A) 06/02/2023 1637   UROBILINOGEN 0.2 11/11/2010 0557   NITRITE NEGATIVE 06/02/2023 1637   LEUKOCYTESUR NEGATIVE 06/02/2023 1637   Sepsis Labs: @LABRCNTIP (procalcitonin:4,lacticidven:4)  ) Recent Results (from the past 240 hour(s))  SARS Coronavirus 2 by RT PCR (hospital order, performed in [facility]  hospital lab) *cepheid single result test* Anterior Nasal Swab     Status: None   Collection Time: 06/03/23 12:50 AM   Specimen: Anterior Nasal Swab  Result Value Ref Range Status   SARS Coronavirus 2 by RT PCR NEGATIVE NEGATIVE Final    Comment: Performed at [facility], [address] [place], [place] [address]  Respiratory (~20 pathogens) panel by PCR     Status: None   Collection Time: 06/03/23 12:51 AM   Specimen: Nasopharyngeal Swab; Respiratory  Result Value Ref Range Status   Adenovirus NOT DETECTED NOT DETECTED Final   Coronavirus 229E NOT DETECTED NOT DETECTED Final    Comment: (NOTE) The Coronavirus on the Respiratory Panel, DOES NOT test for the novel  Coronavirus (2019 nCoV)    Coronavirus HKU1 NOT DETECTED NOT  DETECTED Final   Coronavirus NL63 NOT DETECTED NOT DETECTED Final   Coronavirus OC43 NOT DETECTED NOT DETECTED Final   Metapneumovirus NOT DETECTED NOT DETECTED Final   Rhinovirus / Enterovirus NOT DETECTED NOT DETECTED Final   Influenza A NOT DETECTED NOT DETECTED Final   Influenza B NOT DETECTED NOT DETECTED Final   Parainfluenza Virus 1 NOT DETECTED NOT DETECTED Final   Parainfluenza Virus 2 NOT DETECTED NOT DETECTED Final   Parainfluenza Virus 3 NOT DETECTED NOT DETECTED Final   Parainfluenza Virus 4 NOT DETECTED NOT DETECTED Final   Respiratory Syncytial Virus NOT DETECTED NOT DETECTED Final   Bordetella pertussis NOT DETECTED NOT DETECTED Final   Bordetella Parapertussis NOT DETECTED NOT DETECTED Final   Chlamydophila pneumoniae NOT DETECTED NOT DETECTED Final   Mycoplasma pneumoniae NOT DETECTED NOT DETECTED Final    Comment: Performed at [facility], [address], [place], [place] [address]  Culture, blood (Routine X 2) w Reflex to ID Panel     Status: Abnormal   Collection Time: 06/03/23 12:54 AM   Specimen: BLOOD LEFT ARM  Result Value Ref Range Status   Specimen Description BLOOD LEFT ARM  Final   Special Requests   Final    BOTTLES DRAWN AEROBIC AND ANAEROBIC Blood Culture adequate volume   Culture  Setup Time   Final    GRAM POSITIVE COCCI ANAEROBIC BOTTLE ONLY CRITICAL RESULT CALLED TO, READ BACK BY AND VERIFIED WITH: PHARMD MADISON Barry Dienes [phone] 1654 BY Berline Chough, [place] Performed at [facility], [address], [place], [place] [address]    Culture ENTEROCOCCUS FAECIUM (A)  Final   Report Status 06/05/2023 FINAL  Final   Organism ID, Bacteria ENTEROCOCCUS FAECIUM  Final      Susceptibility   Enterococcus faecium - MIC*    AMPICILLIN <=2 SENSITIVE Sensitive     VANCOMYCIN <=0.5 SENSITIVE Sensitive     GENTAMICIN SYNERGY SENSITIVE Sensitive     * ENTEROCOCCUS FAECIUM  Blood Culture ID Panel (Reflexed)     Status: Abnormal   Collection Time: 06/03/23  12:54 AM  Result Value Ref Range Status   Enterococcus faecalis NOT DETECTED NOT DETECTED Final   Enterococcus Faecium DETECTED (A) NOT DETECTED Final    Comment: CRITICAL RESULT CALLED TO, READ BACK BY AND VERIFIED WITH: PHARMD MADISON OWEN [phone] 1654 BY J RAZZAK, [place]    Listeria monocytogenes NOT DETECTED NOT DETECTED Final   Staphylococcus species NOT DETECTED NOT DETECTED Final   Staphylococcus aureus (BCID) NOT DETECTED NOT DETECTED Final   Staphylococcus epidermidis NOT DETECTED NOT DETECTED Final   Staphylococcus lugdunensis NOT DETECTED NOT DETECTED Final   Streptococcus species  NOT DETECTED NOT DETECTED Final   Streptococcus agalactiae NOT DETECTED NOT DETECTED Final   Streptococcus pneumoniae NOT DETECTED NOT DETECTED Final   Streptococcus pyogenes NOT DETECTED NOT DETECTED Final   A.calcoaceticus-baumannii NOT DETECTED NOT DETECTED Final   Bacteroides fragilis NOT DETECTED NOT DETECTED Final   Enterobacterales NOT DETECTED NOT DETECTED Final   Enterobacter cloacae complex NOT DETECTED NOT DETECTED Final   Escherichia coli NOT DETECTED NOT DETECTED Final   Klebsiella aerogenes NOT DETECTED NOT DETECTED Final   Klebsiella oxytoca NOT DETECTED NOT DETECTED Final   Klebsiella pneumoniae NOT DETECTED NOT DETECTED Final   Proteus species NOT DETECTED NOT DETECTED Final   Salmonella species NOT DETECTED NOT DETECTED Final   Serratia marcescens NOT DETECTED NOT DETECTED Final   Haemophilus influenzae NOT DETECTED NOT DETECTED Final   Neisseria meningitidis NOT DETECTED NOT DETECTED Final   Pseudomonas aeruginosa NOT DETECTED NOT DETECTED Final   Stenotrophomonas maltophilia NOT DETECTED NOT DETECTED Final   Candida albicans NOT DETECTED NOT DETECTED Final   Candida auris NOT DETECTED NOT DETECTED Final   Candida glabrata NOT DETECTED NOT DETECTED Final   Candida krusei NOT DETECTED NOT DETECTED Final   Candida parapsilosis NOT DETECTED NOT DETECTED Final   Candida tropicalis  NOT DETECTED NOT DETECTED Final   Cryptococcus neoformans/gattii NOT DETECTED NOT DETECTED Final   Vancomycin resistance NOT DETECTED NOT DETECTED Final    Comment: Performed at [facility], [address], [place], [place] [address]  Culture, blood (Routine X 2) w Reflex to ID Panel     Status: Abnormal   Collection Time: 06/03/23 12:56 AM   Specimen: BLOOD LEFT HAND  Result Value Ref Range Status   Specimen Description BLOOD LEFT HAND  Final   Special Requests   Final    BOTTLES DRAWN AEROBIC AND ANAEROBIC Blood Culture adequate volume   Culture  Setup Time GRAM POSITIVE COCCI ANAEROBIC BOTTLE ONLY   Final   Culture (A)  Final    ENTEROCOCCUS FAECIUM SUSCEPTIBILITIES PERFORMED ON PREVIOUS CULTURE WITHIN THE LAST 5 DAYS. Performed at [facility], [address] [place], [place] [address]    Report Status 06/05/2023 FINAL  Final  Culture, blood (Routine X 2) w Reflex to ID Panel     Status: None   Collection Time: 06/04/23  7:46 AM   Specimen: BLOOD RIGHT HAND  Result Value Ref Range Status   Specimen Description BLOOD RIGHT HAND  Final   Special Requests   Final    BOTTLES DRAWN AEROBIC AND ANAEROBIC Blood Culture adequate volume   Culture   Final    NO GROWTH 5 DAYS Performed at [facility], [address] [place], [place] [address]    Report Status 06/09/2023 FINAL  Final  Culture, blood (Routine X 2) w Reflex to ID Panel     Status: None   Collection Time: 06/04/23  7:48 AM   Specimen: BLOOD LEFT HAND  Result Value Ref Range Status   Specimen Description BLOOD LEFT HAND  Final   Special Requests   Final    BOTTLES DRAWN AEROBIC AND ANAEROBIC Blood Culture results may not be optimal due to an excessive volume of blood received in culture bottles   Culture   Final    NO GROWTH 5 DAYS Performed at [facility], [address] [place], [place] [address]    Report Status 06/09/2023 FINAL  Final  MRSA Next Gen by PCR, Nasal  Status: None    Collection Time: 06/06/23  9:14 AM   Specimen: Nasal Mucosa; Nasal Swab  Result Value Ref Range Status   MRSA by PCR Next Gen NOT DETECTED NOT DETECTED Final    Comment: (NOTE) The GeneXpert MRSA Assay (FDA approved for NASAL specimens only), is one component of a comprehensive MRSA colonization surveillance program. It is not intended to diagnose MRSA infection nor to guide or monitor treatment for MRSA infections. Test performance is not FDA approved in patients less than 4 years old. Performed at [facility], [address] [place], [place] [address]          Radiology Studies: DG CHEST PORT 1 VIEW  Result Date: 06/10/2023 CLINICAL DATA:  Central line placement EXAM: PORTABLE CHEST 1 VIEW COMPARISON:  06/09/2023 FINDINGS: Normal heart size and mediastinal contours. Dual-chamber pacer leads from the left. The right atrial lead is somewhat more vertical but this is likely due to inflation and positioning. Right PICC with tip at the SVC. Hazy density at the bases greater on the left where there is pleural fluid and volume loss. IMPRESSION: 1. Unremarkable central line placement. 2. Stable opacification at the left more than right base. Electronically Signed   By: Tiburcio Pea M.D.   On: 06/10/2023 11:24   CT ABDOMEN PELVIS W CONTRAST  Result Date: 06/10/2023 CLINICAL DATA:  Sepsis. EXAM: CT ABDOMEN AND PELVIS WITH CONTRAST TECHNIQUE: Multidetector CT imaging of the abdomen and pelvis was performed using the standard protocol following bolus administration of intravenous contrast. RADIATION DOSE REDUCTION: This exam was performed according to the departmental dose-optimization program which includes automated exposure control, adjustment of the mA and/or kV according to patient size and/or use of iterative reconstruction technique. CONTRAST:  75mL OMNIPAQUE IOHEXOL 350 MG/ML SOLN COMPARISON:  12/30/2020. FINDINGS: Lower chest: The heart is enlarged and coronary artery  calcifications are noted. Pacemaker leads are noted in the heart. There are small bilateral pleural effusions. Mild atelectasis or infiltrate is present at the left lung base. Hepatobiliary: No focal liver abnormality is seen. Fatty infiltration of the liver is noted. No gallstones, gallbladder wall thickening, or biliary dilatation. Pancreas: Unremarkable. No pancreatic ductal dilatation or surrounding inflammatory changes. Spleen: Normal in size without focal abnormality. Adrenals/Urinary Tract: No adrenal nodule or mass. Left renal atrophy is noted. Cysts are present in the left kidney. There is perinephric edema on the right. Calcification is noted in the upper pole of the left kidney, possible vascular calcification versus renal calculus. No hydroureteronephrosis bilaterally. The urinary bladder is distended. Stomach/Bowel: Stomach is within normal limits. Appendix is not seen. No evidence of bowel wall thickening, distention, or inflammatory changes. No free air or pneumatosis. Scattered diverticula are present along the colon without evidence of diverticulitis. Vascular/Lymphatic: Aortic atherosclerosis. No enlarged abdominal or pelvic lymph nodes. Reproductive: Status post hysterectomy. No adnexal masses. Other: No abdominopelvic ascites. Musculoskeletal: Degenerative changes are present in the thoracolumbar spine. Stable compression deformities are present in the thoracolumbar spine. No acute osseous abnormality. IMPRESSION: 1. Mild atelectasis or infiltrate at the left lung base. 2. Small bilateral pleural effusions. 3. Hepatic steatosis. 4. Colonic diverticulosis without diverticulitis. 5. Aortic atherosclerosis. Electronically Signed   By: Thornell Sartorius M.D.   On: 06/10/2023 00:16   [place] EKG SITE RITE  Result Date: 06/09/2023 If Site Rite image not attached, placement could not be confirmed due to current cardiac rhythm.  DG CHEST PORT 1 VIEW  Result Date: 06/09/2023 CLINICAL DATA:  Aspiration into  airway EXAM: PORTABLE CHEST 1 VIEW COMPARISON:  Chest x-ray 06/06/2023. FINDINGS: Similar small left pleural effusion with overlying left basilar opacities. No visible pneumothorax. Similar cardiomediastinal silhouette. Left subclavian approach cardiac rhythm is device. IMPRESSION: Similar small left pleural effusion with overlying left basilar atelectasis and/or consolidation. Electronically Signed   By: Feliberto Harts M.D.   On: 06/09/2023 15:31   ECHO TEE  Result Date: 06/09/2023    TRANSESOPHOGEAL ECHO REPORT   Patient Name:   [name] Date of Exam: 06/09/2023 Medical Rec #:  [id number]          Height:       60.0 in Accession #:    [id number]         Weight:       120.4 lb Date of Birth:  1941/06/10           BSA:          1.504 m Patient Age:    81 years           BP:           137/89 mmHg Patient Gender: F                  HR:           80 bpm. Exam Location:  Inpatient Procedure: Transesophageal Echo, Color Doppler, Cardiac Doppler and 3D Echo Indications:     Bacteremia  History:         Patient has prior history of Echocardiogram examinations, most                  recent 06/04/2023. CHF, CAD, Pacemaker, COPD and history of                  Rheumatic fever, Arrythmias:Atrial Fibrillation,                  Signs/Symptoms:Edema; Risk Factors:Hypertension, Dyslipidemia                  and Current Smoker.  Sonographer:     Delcie Roch RDCS Referring Phys:  [id number] Graciella Freer Diagnosing Phys: Lennie Odor MD PROCEDURE: After discussion of the risks and benefits of a TEE, an informed consent was obtained from the patient. TEE procedure time was 7 minutes. The transesophogeal probe was passed without difficulty through the esophogus of the patient. Imaged were  obtained with the patient in a left lateral decubitus position. Sedation performed by different physician. The patient was monitored while under deep sedation. Anesthestetic sedation was provided intravenously by  Anesthesiology: 30mg  of Propofol, 50mg  of Lidocaine. Image quality was excellent. The patient's vital signs; including heart rate, blood pressure, and oxygen saturation; remained stable throughout the procedure. The patient developed no complications during the procedure.  IMPRESSIONS  1. No evidence of endocarditis on the ppm leads.  2. Left ventricular ejection fraction, by estimation, is 55 to 60%. The left ventricle has normal function. There is the interventricular septum is flattened in systole and diastole, consistent with right ventricular pressure and volume overload.  3. Right ventricular systolic function is mildly reduced. The right ventricular size is moderately enlarged.  4. Left atrial size was severely dilated. No left atrial/left atrial appendage thrombus was detected.  5. Right atrial size was severely dilated.  6. The mitral valve is normal in structure. Mild to moderate mitral valve regurgitation. No evidence of mitral stenosis.  7. Severe, torrential TR. Septal leaflet appears impinged on 3D assessment  with dilation of the posterior annulus and incomplete coaptation. RA bows into the LA, consistent with elevated RA pressure. The tricuspid valve is abnormal. Tricuspid valve regurgitation is severe.  8. The aortic valve is tricuspid. Aortic valve regurgitation is not visualized. No aortic stenosis is present.  9. There is mild (Grade II) layered plaque involving the descending aorta. Conclusion(s)/Recommendation(s): No evidence of vegetation/infective endocarditis on this transesophageael echocardiogram. FINDINGS  Left Ventricle: Left ventricular ejection fraction, by estimation, is 55 to 60%. The left ventricle has normal function. The left ventricular internal cavity size was normal in size. The interventricular septum is flattened in systole and diastole, consistent with right ventricular pressure and volume overload. Right Ventricle: The right ventricular size is moderately enlarged. No  increase in right ventricular wall thickness. Right ventricular systolic function is mildly reduced. Left Atrium: Left atrial size was severely dilated. No left atrial/left atrial appendage thrombus was detected. Right Atrium: Right atrial size was severely dilated. Pericardium: There is no evidence of pericardial effusion. Mitral Valve: The mitral valve is normal in structure. Mild to moderate mitral valve regurgitation. No evidence of mitral valve stenosis. There is no evidence of mitral valve vegetation. Tricuspid Valve: Severe, torrential TR. Septal leaflet appears impinged on 3D assessment with dilation of the posterior annulus and incomplete coaptation. RA bows into the LA, consistent with elevated RA pressure. The tricuspid valve is abnormal. Tricuspid valve regurgitation is severe. No evidence of tricuspid stenosis. There is no evidence of tricuspid valve vegetation. Aortic Valve: The aortic valve is tricuspid. Aortic valve regurgitation is not visualized. No aortic stenosis is present. There is no evidence of aortic valve vegetation. Pulmonic Valve: The pulmonic valve was normal in structure. Pulmonic valve regurgitation is trivial. No evidence of pulmonic stenosis. There is no evidence of pulmonic valve vegetation. Aorta: The aortic root and ascending aorta are structurally normal, with no evidence of dilitation. There is mild (Grade II) layered plaque involving the descending aorta. IAS/Shunts: There is left bowing of the interatrial septum, suggestive of elevated right atrial pressure. No atrial level shunt detected by color flow Doppler. Additional Comments: A device lead is visualized in the right atrium and right ventricle.  AORTA Ao Root diam: 2.95 cm Ao Asc diam:  2.96 cm Lennie Odor MD Electronically signed by Lennie Odor MD Signature Date/Time: 06/09/2023/9:33:42 AM    Final    EP STUDY  Result Date: 06/09/2023 See surgical note for result.       Scheduled Meds:  apixaban  2.5 mg  Oral BID   aspirin EC  81 mg Oral Daily   carvedilol  6.25 mg Oral BID WC   Chlorhexidine Gluconate Cloth  6 each Topical Daily   diltiazem  120 mg Oral Daily   ferrous sulfate  325 mg Oral Q breakfast   furosemide  40 mg Oral Daily   guaiFENesin  600 mg Oral BID   ipratropium  0.5 mg Nebulization TID   levalbuterol  1.25 mg Nebulization TID   montelukast  10 mg Oral QHS   pantoprazole  40 mg Oral Daily   predniSONE  40 mg Oral Q breakfast   sodium chloride flush  10-40 mL Intracatheter Q12H   sodium chloride flush  3 mL Intravenous Q12H   Continuous Infusions:  ampicillin (OMNIPEN) IV 2 g (06/10/23 1644)     LOS: 8 days    Time spent: 35 minutes    Marin Olp, MD [facility] 7PM-7AM contact night coverage as above

## 2023-06-10 NOTE — Plan of Care (Signed)
  Problem: Pain Managment: Goal: General experience of comfort will improve Outcome: Progressing   Problem: Safety: Goal: Ability to remain free from injury will improve Outcome: Progressing   Problem: Skin Integrity: Goal: Risk for impaired skin integrity will decrease Outcome: Progressing   

## 2023-06-10 NOTE — Progress Notes (Signed)
  Bedside nurse reported that BMP showed potassium 2.9. Patient will take oral medication - Giving oral KCl 80 meq now and checking mag level. -Checking BMP and mag again at 7 AM and will need to replete electrolytes as needed.  Tereasa Coop, MD [facility] 06/10/2023, 4:08 AM

## 2023-06-10 NOTE — TOC Transition Note (Signed)
Transition of Care [facility]) - CM/SW Discharge Note   Patient Details  Name: [name] MRN: [id number] Date of Birth: October 07, 1941  Transition of Care [facility]) CM/SW Contact:  Ronny Bacon, RN Phone Number: 06/10/2023, 3:55 PM   Clinical Narrative:  Attempt made to set up Home IV antibiotic services. Per Marchelle Folks, patient insurance coverage will require prior authorization. She will work on getting authorization on Monday. If unable to get authorization, she will reach out to other home IV antibiotic services to see if they can accept patient insurance. Family updated.      Barriers to Discharge: Continued Medical Work up   Patient Goals and CMS Choice      Discharge Placement                         Discharge Plan and Services Additional resources added to the After Visit Summary for   In-house Referral: NA Discharge Planning Services: CM Consult Post Acute Care Choice: Home Health            DME Agency: NA                  Social Determinants of Health (SDOH) Interventions SDOH Screenings   Food Insecurity: No Food Insecurity (06/03/2023)  Housing: Low Risk  (06/03/2023)  Transportation Needs: No Transportation Needs (06/03/2023)  Utilities: Not At Risk (06/03/2023)  Tobacco Use: High Risk (06/09/2023)     Readmission Risk Interventions     No data to display

## 2023-06-10 NOTE — Progress Notes (Signed)
Peripherally Inserted Central Catheter Placement  The IV Nurse has discussed with the patient and/or persons authorized to consent for the patient, the purpose of this procedure and the potential benefits and risks involved with this procedure.  The benefits include less needle sticks, lab draws from the catheter, and the patient may be discharged home with the catheter. Risks include, but not limited to, infection, bleeding, blood clot (thrombus formation), and puncture of an artery; nerve damage and irregular heartbeat and possibility to perform a PICC exchange if needed/ordered by physician.  Alternatives to this procedure were also discussed.  Bard Power PICC patient education guide, fact sheet on infection prevention and patient information card has been provided to patient /or left at bedside.    PICC Placement Documentation  PICC Single Lumen 06/10/23 Right Basilic 34 cm 0 cm (Active)  Indication for Insertion or Continuance of Line Home intravenous therapies (PICC only) 06/10/23 1056  Exposed Catheter (cm) 0 cm 06/10/23 1056  Site Assessment Clean, Dry, Intact 06/10/23 1056  Line Status Saline locked;Flushed;Blood return noted 06/10/23 1056  Dressing Type Transparent;Securing device 06/10/23 1056  Dressing Status Antimicrobial disc in place;Clean, Dry, Intact 06/10/23 1056  Line Care Connections checked and tightened 06/10/23 1056  Line Adjustment (NICU/IV Team Only) No 06/10/23 1056  Dressing Intervention New dressing 06/10/23 1056  Dressing Change Due  06/10/23 1056       Burnard Bunting Chenice 06/10/2023, 10:57 AM

## 2023-06-11 ENCOUNTER — Encounter (HOSPITAL_COMMUNITY): Payer: Self-pay | Admitting: Cardiovascular Disease

## 2023-06-11 ENCOUNTER — Inpatient Hospital Stay (HOSPITAL_COMMUNITY): Payer: No Typology Code available for payment source

## 2023-06-11 DIAGNOSIS — R7881 Bacteremia: Secondary | ICD-10-CM | POA: Diagnosis not present

## 2023-06-11 DIAGNOSIS — B952 Enterococcus as the cause of diseases classified elsewhere: Secondary | ICD-10-CM | POA: Diagnosis not present

## 2023-06-11 LAB — BLOOD GAS, VENOUS
Acid-Base Excess: 14.8 mmol/L — ABNORMAL HIGH (ref 0.0–2.0)
Bicarbonate: 43.9 mmol/L — ABNORMAL HIGH (ref 20.0–28.0)
O2 Saturation: 43 %
Patient temperature: 37
pCO2, Ven: 76 mmHg (ref 44–60)
pH, Ven: 7.37 (ref 7.25–7.43)
pO2, Ven: 31 mmHg — CL (ref 32–45)

## 2023-06-11 LAB — CBC
HCT: 30.3 % — ABNORMAL LOW (ref 36.0–46.0)
Hemoglobin: 9.2 g/dL — ABNORMAL LOW (ref 12.0–15.0)
MCH: 24.4 pg — ABNORMAL LOW (ref 26.0–34.0)
MCHC: 30.4 g/dL (ref 30.0–36.0)
MCV: 80.4 fL (ref 80.0–100.0)
Platelets: 301 10*3/uL (ref 150–400)
RBC: 3.77 MIL/uL — ABNORMAL LOW (ref 3.87–5.11)
RDW: 22.5 % — ABNORMAL HIGH (ref 11.5–15.5)
WBC: 11.7 10*3/uL — ABNORMAL HIGH (ref 4.0–10.5)
nRBC: 0.4 % — ABNORMAL HIGH (ref 0.0–0.2)

## 2023-06-11 LAB — PROCALCITONIN: Procalcitonin: <0.1 ng/mL

## 2023-06-11 LAB — COMPREHENSIVE METABOLIC PANEL
ALT: 71 U/L — ABNORMAL HIGH (ref 0–44)
AST: 26 U/L (ref 15–41)
Albumin: 3 g/dL — ABNORMAL LOW (ref 3.5–5.0)
Alkaline Phosphatase: 52 U/L (ref 38–126)
Anion gap: 11 (ref 5–15)
BUN: 32 mg/dL — ABNORMAL HIGH (ref 8–23)
CO2: 38 mmol/L — ABNORMAL HIGH (ref 22–32)
Calcium: 7.6 mg/dL — ABNORMAL LOW (ref 8.9–10.3)
Chloride: 89 mmol/L — ABNORMAL LOW (ref 98–111)
Creatinine, Ser: 1.24 mg/dL — ABNORMAL HIGH (ref 0.44–1.00)
GFR, Estimated: 44 mL/min — ABNORMAL LOW (ref >60)
Glucose, Bld: 168 mg/dL — ABNORMAL HIGH (ref 70–99)
Potassium: 3.9 mmol/L (ref 3.5–5.1)
Sodium: 138 mmol/L (ref 135–145)
Total Bilirubin: 0.6 mg/dL (ref 0.3–1.2)
Total Protein: 5.5 g/dL — ABNORMAL LOW (ref 6.5–8.1)

## 2023-06-11 LAB — EXPECTORATED SPUTUM ASSESSMENT W GRAM STAIN, RFLX TO RESP C

## 2023-06-11 LAB — BRAIN NATRIURETIC PEPTIDE: B Natriuretic Peptide: 541 pg/mL — ABNORMAL HIGH (ref 0.0–100.0)

## 2023-06-11 LAB — MAGNESIUM: Magnesium: 2.2 mg/dL (ref 1.7–2.4)

## 2023-06-11 MED ORDER — LEVALBUTEROL HCL 0.63 MG/3ML IN NEBU
INHALATION_SOLUTION | RESPIRATORY_TRACT | Status: AC
  Administered 2023-06-11: 0.63 mg
  Filled 2023-06-11: qty 3

## 2023-06-11 MED ORDER — LEVALBUTEROL HCL 0.63 MG/3ML IN NEBU
0.6300 mg | INHALATION_SOLUTION | RESPIRATORY_TRACT | Status: AC
  Administered 2023-06-11: 0.63 mg via RESPIRATORY_TRACT
  Filled 2023-06-11: qty 3

## 2023-06-11 MED ORDER — METHYLPREDNISOLONE SODIUM SUCC 125 MG IJ SOLR
125.0000 mg | Freq: Two times a day (BID) | INTRAMUSCULAR | Status: DC
  Administered 2023-06-11 – 2023-06-15 (×10): 125 mg via INTRAVENOUS
  Filled 2023-06-11 (×9): qty 2

## 2023-06-11 MED ORDER — LEVOFLOXACIN IN D5W 500 MG/100ML IV SOLN: 500.0000 mg | INTRAVENOUS | Status: DC

## 2023-06-11 MED ORDER — METHYLPREDNISOLONE SODIUM SUCC 40 MG IJ SOLR: 40.0000 mg | Freq: Every day | INTRAMUSCULAR | Status: DC

## 2023-06-11 MED ORDER — HYDROXYZINE HCL 25 MG PO TABS
25.0000 mg | ORAL_TABLET | Freq: Four times a day (QID) | ORAL | Status: DC | PRN
  Administered 2023-06-11 – 2023-06-14 (×7): 25 mg via ORAL
  Filled 2023-06-11 (×8): qty 1

## 2023-06-11 MED ORDER — DEXTROSE 5 % IV SOLN: 250.0000 mg | INTRAVENOUS | Status: DC

## 2023-06-11 MED ORDER — LEVOFLOXACIN IN D5W 500 MG/100ML IV SOLN
500.0000 mg | INTRAVENOUS | Status: DC
  Administered 2023-06-11: 500 mg via INTRAVENOUS
  Filled 2023-06-11 (×2): qty 100

## 2023-06-11 MED ORDER — SODIUM CHLORIDE 0.9 % IV SOLN: 500.0000 mg | INTRAVENOUS | Status: DC

## 2023-06-11 NOTE — Progress Notes (Addendum)
PROGRESS NOTE    [name]  OVF:[id number] DOB: 12/10/1940 DOA: 06/02/2023 PCP: Leane Call, PA-C  Outpatient Specialists:     Brief Narrative:  Patient is an 82 year old female with past medical history significant for COPD on home oxygen, coronary artery disease, carotid artery occlusion, congestive heart failure, hyperlipidemia, paroxysmal atrial tachycardia, rheumatic fever and seizure.  There is also documentation of asthma.  Patient has chronic low back pain.  Patient was admitted with hypertensive emergency (systolic blood pressure of 240 mmHg).  Documented leg edema.  Leg edema has resolved.  Blood pressure slowly improving.  06/08/2023: Awaiting TEE.  TEE is planned for tomorrow.  Blood pressure is controlled.  Continue antibiotics.  Input from EP and infectious disease team is appreciated.  06/09/2023: Patient underwent TEE today of without significant finding.  Will proceed with CT scan of the abdomen and pelvis.  Patient has contrast dye allergy.  Patient will need to be pretreated.  Infectious disease input is appreciated.  Patient will complete antibiotics on 07/17/2023.  Mild shortness of breath reported after TEE.  Patient was noted to have chest tightness/wheezing.  Patient has COPD, is currently being treated for exacerbation.  Cardiac BNP was not elevated.  IV Lasix 40 Mg twice daily x 2 doses and reevaluate.  Continue COPD management.  Pursue disposition after reviewing CT abdomen and pelvis.  Complete course of antibiotics on discharge.  See OPAT.  8/17: CT abd/pelvis unremarkable. insurance has not approved OPAT. 8/18: increased O2 requirement overnight, started on COPD treatment. CXR read with possible malpositioned RA lead, recommend cardiology curbside 8/19.  Assessment & Plan:   Principal Problem:   Bacteremia due to Enterococcus Active Problems:   A-fib (HCC)   COPD with acute exacerbation (HCC)   Hypertensive emergency   Anemia   Leg swelling    Pacemaker infection (HCC)   Left lower quadrant abdominal pain   Foot lesion  Hypertensive emergency: On presentation, systolic blood pressure of 240 mmHg was started on Cardizem oral/drip. Likely 2/2 back pain. Improved on orals.  -Start Coreg 3.125 Mg p.o. twice daily. -diltizem 120 mg once daily -Goal blood pressure should be less than 130/80 mmHg.   -Optimize pain control and alleviate any inconveniences. 06/09/2023: Optimized  COPD with acute exacerbation  On 2 L O2 at home 06/09/2023: Mild exacerbation with increased dyspnea and sputum noted after TEE. Pred 40 mg once daily x 5 days, switched to iv methylpred 8/18, started levaquin due to worsening respiratory status overnight.  -Continue nebulizer treatment. -Continue Singulair, xopenex scheduled -started levaquin 500 mg once daily  -iv methylpred 8/18 -scheduled duo-nebs -cxr, vbg, bnp, sputum sample    Leg swelling Resolved. -Continue Lasix. -Monitor renal function and electrolytes.    Enterococcus Bacteremia: 2 out of blood cultures said to be growing Enterococcus, etiology unknown. CT abd/pelvis normal, TEE ruled out infected leads.  -ID team consulted, recommending 6 weeks of OPAT with ampicillin -insurance still approving OPAT -PICC lined placed 8/17  Anemia -Iron is 23, TIBC of 451, ferritin of 47. -B12 is 5687. -Folate level is 31.3. -Suggestive of iron deficiency anemia, possibly blood loss anemia. -Start ferrous sulfate. -GI follow-up on discharge.  Patient may need EGD and, if indicated lower GI scope as well  A-fib (HCC) -Chronic diagnosis, continue apixaban 2.5 mg BID -Rate controlled.  Constipation/gaseous abdominal distention: -Try soapsuds enema. -Laxatives. -Patient may need peripheral opiate blocker. -Minimize opiates as much as possible.   DVT prophylaxis: Apixaban. Code Status: Full code. Family  Communication: Husband. Disposition Plan: Likely discharge back home tomorrow.  Patient  remains inpatient.   Consultants:  None.  Procedures:  None.  Antimicrobials:  IV Rocephin discontinued IV azithromycin discontinued IV vancomycin   Subjective:  Reports SOB is better/stable.  Objective: Vitals:   06/11/23 0748 06/11/23 0800 06/11/23 0910 06/11/23 1047  BP: (!) 149/78     Pulse: 80     Resp:   20   Temp:  98.5 F (36.9 C)    TempSrc:  Oral    SpO2: 96%  92% 99%  Weight:      Height:        Intake/Output Summary (Last 24 hours) at 06/11/2023 1255 Last data filed at 06/11/2023 1013 Gross per 24 hour  Intake 130 ml  Output 800 ml  Net -670 ml   Filed Weights   06/07/23 0522 06/10/23 0529 06/11/23 0300  Weight: 54.6 kg 54.2 kg 56.2 kg    Examination:  General exam: Appears calm and comfortable. Respiratory system: Decreased air entry with wheezing. Cardiovascular system: S1 & S2 heard Gastrointestinal system: Abdomen is soft and nontender.  Central nervous system: Alert and oriented.  Patient moves all extremities.   Extremities: No leg edema.  Data Reviewed: I have personally reviewed following labs and imaging studies  CBC: Recent Labs  Lab 06/09/23 1455 06/10/23 0239 06/11/23 0028  WBC 14.3* 9.5 11.7*  HGB 9.8* 9.4* 9.2*  HCT 31.9* 30.2* 30.3*  MCV 80.8 77.6* 80.4  PLT 342 285 301   Basic Metabolic Panel: Recent Labs  Lab 06/06/23 0042 06/08/23 0815 06/10/23 0239 06/10/23 0645 06/11/23 0028  NA 126* 134* 135 136 138  K 3.3* 3.7 2.9* 5.1 3.9  CL 83* 94* 89* 91* 89*  CO2 31 28 33* 33* 38*  GLUCOSE 124* 147* 154* 161* 168*  BUN 9 23 27* 28* 32*  CREATININE 0.83 1.10* 0.97 1.04* 1.24*  CALCIUM 7.7* 7.7* 7.4* 7.5* 7.6*  MG 1.8 2.3 2.2 2.1 2.2  PHOS 2.8  --   --   --   --    GFR: Estimated Creatinine Clearance: 28.4 mL/min (A) (by C-G formula based on SCr of 1.24 mg/dL (H)). Liver Function Tests: Recent Labs  Lab 06/11/23 0028  AST 26  ALT 71*  ALKPHOS 52  BILITOT 0.6  PROT 5.5*  ALBUMIN 3.0*   No results for  input(s): "LIPASE", "AMYLASE" in the last 168 hours.  No results for input(s): "AMMONIA" in the last 168 hours. Coagulation Profile: No results for input(s): "INR", "PROTIME" in the last 168 hours.  Cardiac Enzymes: No results for input(s): "CKTOTAL", "CKMB", "CKMBINDEX", "TROPONINI" in the last 168 hours. BNP (last 3 results) No results for input(s): "PROBNP" in the last 8760 hours. HbA1C: No results for input(s): "HGBA1C" in the last 72 hours. CBG: Recent Labs  Lab 06/10/23 2006  GLUCAP 220*   Lipid Profile: No results for input(s): "CHOL", "HDL", "LDLCALC", "TRIG", "CHOLHDL", "LDLDIRECT" in the last 72 hours. Thyroid Function Tests: No results for input(s): "TSH", "T4TOTAL", "FREET4", "T3FREE", "THYROIDAB" in the last 72 hours. Anemia Panel: No results for input(s): "VITAMINB12", "FOLATE", "FERRITIN", "TIBC", "IRON", "RETICCTPCT" in the last 72 hours.  Urine analysis:    Component Value Date/Time   COLORURINE STRAW (A) 06/02/2023 1637   APPEARANCEUR CLEAR 06/02/2023 1637   LABSPEC 1.005 06/02/2023 1637   PHURINE 7.0 06/02/2023 1637   GLUCOSEU NEGATIVE 06/02/2023 1637   HGBUR NEGATIVE 06/02/2023 1637   BILIRUBINUR NEGATIVE 06/02/2023 1637   KETONESUR NEGATIVE 06/02/2023  1637   PROTEINUR 30 (A) 06/02/2023 1637   UROBILINOGEN 0.2 11/11/2010 0557   NITRITE NEGATIVE 06/02/2023 1637   LEUKOCYTESUR NEGATIVE 06/02/2023 1637   Sepsis Labs: @LABRCNTIP (procalcitonin:4,lacticidven:4)  ) Recent Results (from the past 240 hour(s))  SARS Coronavirus 2 by RT PCR (hospital order, performed in [facility] hospital lab) *cepheid single result test* Anterior Nasal Swab     Status: None   Collection Time: 06/03/23 12:50 AM   Specimen: Anterior Nasal Swab  Result Value Ref Range Status   SARS Coronavirus 2 by RT PCR NEGATIVE NEGATIVE Final    Comment: Performed at [facility], [address], [place], [place] [address]  Respiratory (~20 pathogens) panel by PCR     Status: None    Collection Time: 06/03/23 12:51 AM   Specimen: Nasopharyngeal Swab; Respiratory  Result Value Ref Range Status   Adenovirus NOT DETECTED NOT DETECTED Final   Coronavirus 229E NOT DETECTED NOT DETECTED Final    Comment: (NOTE) The Coronavirus on the Respiratory Panel, DOES NOT test for the novel  Coronavirus (2019 nCoV)    Coronavirus HKU1 NOT DETECTED NOT DETECTED Final   Coronavirus NL63 NOT DETECTED NOT DETECTED Final   Coronavirus OC43 NOT DETECTED NOT DETECTED Final   Metapneumovirus NOT DETECTED NOT DETECTED Final   Rhinovirus / Enterovirus NOT DETECTED NOT DETECTED Final   Influenza A NOT DETECTED NOT DETECTED Final   Influenza B NOT DETECTED NOT DETECTED Final   Parainfluenza Virus 1 NOT DETECTED NOT DETECTED Final   Parainfluenza Virus 2 NOT DETECTED NOT DETECTED Final   Parainfluenza Virus 3 NOT DETECTED NOT DETECTED Final   Parainfluenza Virus 4 NOT DETECTED NOT DETECTED Final   Respiratory Syncytial Virus NOT DETECTED NOT DETECTED Final   Bordetella pertussis NOT DETECTED NOT DETECTED Final   Bordetella Parapertussis NOT DETECTED NOT DETECTED Final   Chlamydophila pneumoniae NOT DETECTED NOT DETECTED Final   Mycoplasma pneumoniae NOT DETECTED NOT DETECTED Final    Comment: Performed at [facility], [address] [place], [place] [address]  Culture, blood (Routine X 2) w Reflex to ID Panel     Status: Abnormal   Collection Time: 06/03/23 12:54 AM   Specimen: BLOOD LEFT ARM  Result Value Ref Range Status   Specimen Description BLOOD LEFT ARM  Final   Special Requests   Final    BOTTLES DRAWN AEROBIC AND ANAEROBIC Blood Culture adequate volume   Culture  Setup Time   Final    GRAM POSITIVE COCCI ANAEROBIC BOTTLE ONLY CRITICAL RESULT CALLED TO, READ BACK BY AND VERIFIED WITH: PHARMD MADISON Barry Dienes [id number] BY Berline Chough, [place] Performed at [facility], [address] [place], [place] [address]    Culture ENTEROCOCCUS FAECIUM (A)  Final   Report  Status 06/05/2023 FINAL  Final   Organism ID, Bacteria ENTEROCOCCUS FAECIUM  Final      Susceptibility   Enterococcus faecium - MIC*    AMPICILLIN <=2 SENSITIVE Sensitive     VANCOMYCIN <=0.5 SENSITIVE Sensitive     GENTAMICIN SYNERGY SENSITIVE Sensitive     * ENTEROCOCCUS FAECIUM  Blood Culture ID Panel (Reflexed)     Status: Abnormal   Collection Time: 06/03/23 12:54 AM  Result Value Ref Range Status   Enterococcus faecalis NOT DETECTED NOT DETECTED Final   Enterococcus Faecium DETECTED (A) NOT DETECTED Final    Comment: CRITICAL RESULT CALLED TO, READ BACK BY AND VERIFIED WITH: PHARMD MADISON OWEN [id number] BY J  RAZZAK, MT    Listeria monocytogenes NOT DETECTED NOT DETECTED Final   Staphylococcus species NOT DETECTED NOT DETECTED Final   Staphylococcus aureus (BCID) NOT DETECTED NOT DETECTED Final   Staphylococcus epidermidis NOT DETECTED NOT DETECTED Final   Staphylococcus lugdunensis NOT DETECTED NOT DETECTED Final   Streptococcus species NOT DETECTED NOT DETECTED Final   Streptococcus agalactiae NOT DETECTED NOT DETECTED Final   Streptococcus pneumoniae NOT DETECTED NOT DETECTED Final   Streptococcus pyogenes NOT DETECTED NOT DETECTED Final   A.calcoaceticus-baumannii NOT DETECTED NOT DETECTED Final   Bacteroides fragilis NOT DETECTED NOT DETECTED Final   Enterobacterales NOT DETECTED NOT DETECTED Final   Enterobacter cloacae complex NOT DETECTED NOT DETECTED Final   Escherichia coli NOT DETECTED NOT DETECTED Final   Klebsiella aerogenes NOT DETECTED NOT DETECTED Final   Klebsiella oxytoca NOT DETECTED NOT DETECTED Final   Klebsiella pneumoniae NOT DETECTED NOT DETECTED Final   Proteus species NOT DETECTED NOT DETECTED Final   Salmonella species NOT DETECTED NOT DETECTED Final   Serratia marcescens NOT DETECTED NOT DETECTED Final   Haemophilus influenzae NOT DETECTED NOT DETECTED Final   Neisseria meningitidis NOT DETECTED NOT DETECTED Final   Pseudomonas aeruginosa  NOT DETECTED NOT DETECTED Final   Stenotrophomonas maltophilia NOT DETECTED NOT DETECTED Final   Candida albicans NOT DETECTED NOT DETECTED Final   Candida auris NOT DETECTED NOT DETECTED Final   Candida glabrata NOT DETECTED NOT DETECTED Final   Candida krusei NOT DETECTED NOT DETECTED Final   Candida parapsilosis NOT DETECTED NOT DETECTED Final   Candida tropicalis NOT DETECTED NOT DETECTED Final   Cryptococcus neoformans/gattii NOT DETECTED NOT DETECTED Final   Vancomycin resistance NOT DETECTED NOT DETECTED Final    Comment: Performed at [facility], [address], [place], [place] [address]  Culture, blood (Routine X 2) w Reflex to ID Panel     Status: Abnormal   Collection Time: 06/03/23 12:56 AM   Specimen: BLOOD LEFT HAND  Result Value Ref Range Status   Specimen Description BLOOD LEFT HAND  Final   Special Requests   Final    BOTTLES DRAWN AEROBIC AND ANAEROBIC Blood Culture adequate volume   Culture  Setup Time GRAM POSITIVE COCCI ANAEROBIC BOTTLE ONLY   Final   Culture (A)  Final    ENTEROCOCCUS FAECIUM SUSCEPTIBILITIES PERFORMED ON PREVIOUS CULTURE WITHIN THE LAST 5 DAYS. Performed at [facility], [address], [place], [place] [address]    Report Status 06/05/2023 FINAL  Final  Culture, blood (Routine X 2) w Reflex to ID Panel     Status: None   Collection Time: 06/04/23  7:46 AM   Specimen: BLOOD RIGHT HAND  Result Value Ref Range Status   Specimen Description BLOOD RIGHT HAND  Final   Special Requests   Final    BOTTLES DRAWN AEROBIC AND ANAEROBIC Blood Culture adequate volume   Culture   Final    NO GROWTH 5 DAYS Performed at [facility], [address] [place], [place] [address]    Report Status 06/09/2023 FINAL  Final  Culture, blood (Routine X 2) w Reflex to ID Panel     Status: None   Collection Time: 06/04/23  7:48 AM   Specimen: BLOOD LEFT HAND  Result Value Ref Range Status   Specimen Description BLOOD LEFT HAND  Final    Special Requests   Final    BOTTLES DRAWN AEROBIC AND ANAEROBIC Blood Culture results may not be optimal  due to an excessive volume of blood received in culture bottles   Culture   Final    NO GROWTH 5 DAYS Performed at [facility], [address], [place], [place] [address]    Report Status 06/09/2023 FINAL  Final  MRSA Next Gen by PCR, Nasal     Status: None   Collection Time: 06/06/23  9:14 AM   Specimen: Nasal Mucosa; Nasal Swab  Result Value Ref Range Status   MRSA by PCR Next Gen NOT DETECTED NOT DETECTED Final    Comment: (NOTE) The GeneXpert MRSA Assay (FDA approved for NASAL specimens only), is one component of a comprehensive MRSA colonization surveillance program. It is not intended to diagnose MRSA infection nor to guide or monitor treatment for MRSA infections. Test performance is not FDA approved in patients less than 38 years old. Performed at [facility], [address], [place], [place] [address]          Radiology Studies: DG CHEST PORT 1 VIEW  Result Date: 06/10/2023 CLINICAL DATA:  Central line placement EXAM: PORTABLE CHEST 1 VIEW COMPARISON:  06/09/2023 FINDINGS: Normal heart size and mediastinal contours. Dual-chamber pacer leads from the left. The right atrial lead is somewhat more vertical but this is likely due to inflation and positioning. Right PICC with tip at the SVC. Hazy density at the bases greater on the left where there is pleural fluid and volume loss. IMPRESSION: 1. Unremarkable central line placement. 2. Stable opacification at the left more than right base. Electronically Signed   By: Tiburcio Pea M.D.   On: 06/10/2023 11:24   CT ABDOMEN PELVIS W CONTRAST  Result Date: 06/10/2023 CLINICAL DATA:  Sepsis. EXAM: CT ABDOMEN AND PELVIS WITH CONTRAST TECHNIQUE: Multidetector CT imaging of the abdomen and pelvis was performed using the standard protocol following bolus administration of intravenous contrast. RADIATION DOSE REDUCTION:  This exam was performed according to the departmental dose-optimization program which includes automated exposure control, adjustment of the mA and/or kV according to patient size and/or use of iterative reconstruction technique. CONTRAST:  75mL OMNIPAQUE IOHEXOL 350 MG/ML SOLN COMPARISON:  12/30/2020. FINDINGS: Lower chest: The heart is enlarged and coronary artery calcifications are noted. Pacemaker leads are noted in the heart. There are small bilateral pleural effusions. Mild atelectasis or infiltrate is present at the left lung base. Hepatobiliary: No focal liver abnormality is seen. Fatty infiltration of the liver is noted. No gallstones, gallbladder wall thickening, or biliary dilatation. Pancreas: Unremarkable. No pancreatic ductal dilatation or surrounding inflammatory changes. Spleen: Normal in size without focal abnormality. Adrenals/Urinary Tract: No adrenal nodule or mass. Left renal atrophy is noted. Cysts are present in the left kidney. There is perinephric edema on the right. Calcification is noted in the upper pole of the left kidney, possible vascular calcification versus renal calculus. No hydroureteronephrosis bilaterally. The urinary bladder is distended. Stomach/Bowel: Stomach is within normal limits. Appendix is not seen. No evidence of bowel wall thickening, distention, or inflammatory changes. No free air or pneumatosis. Scattered diverticula are present along the colon without evidence of diverticulitis. Vascular/Lymphatic: Aortic atherosclerosis. No enlarged abdominal or pelvic lymph nodes. Reproductive: Status post hysterectomy. No adnexal masses. Other: No abdominopelvic ascites. Musculoskeletal: Degenerative changes are present in the thoracolumbar spine. Stable compression deformities are present in the thoracolumbar spine. No acute osseous abnormality. IMPRESSION: 1. Mild atelectasis or infiltrate at the left lung base. 2. Small bilateral pleural effusions. 3. Hepatic steatosis. 4.  Colonic diverticulosis without diverticulitis. 5. Aortic  atherosclerosis. Electronically Signed   By: Thornell Sartorius M.D.   On: 06/10/2023 00:16   [place] EKG SITE RITE  Result Date: 06/09/2023 If Site Rite image not attached, placement could not be confirmed due to current cardiac rhythm.  DG CHEST PORT 1 VIEW  Result Date: 06/09/2023 CLINICAL DATA:  Aspiration into airway EXAM: PORTABLE CHEST 1 VIEW COMPARISON:  Chest x-ray 06/06/2023. FINDINGS: Similar small left pleural effusion with overlying left basilar opacities. No visible pneumothorax. Similar cardiomediastinal silhouette. Left subclavian approach cardiac rhythm is device. IMPRESSION: Similar small left pleural effusion with overlying left basilar atelectasis and/or consolidation. Electronically Signed   By: Feliberto Harts M.D.   On: 06/09/2023 15:31        Scheduled Meds:  apixaban  2.5 mg Oral BID   aspirin EC  81 mg Oral Daily   carvedilol  6.25 mg Oral BID WC   Chlorhexidine Gluconate Cloth  6 each Topical Daily   diltiazem  120 mg Oral Daily   ferrous sulfate  325 mg Oral Q breakfast   furosemide  40 mg Oral Daily   guaiFENesin  600 mg Oral BID   ipratropium  0.5 mg Nebulization TID   levalbuterol  1.25 mg Nebulization TID   methylPREDNISolone (SOLU-MEDROL) injection  125 mg Intravenous Q12H   montelukast  10 mg Oral QHS   pantoprazole  40 mg Oral Daily   sodium chloride flush  10-40 mL Intracatheter Q12H   sodium chloride flush  3 mL Intravenous Q12H   Continuous Infusions:  ampicillin (OMNIPEN) IV 2 g (06/11/23 0700)   levofloxacin (LEVAQUIN) IV       LOS: 9 days    Time spent: 35 minutes    Marin Olp, MD [facility] 7PM-7AM contact night coverage as above

## 2023-06-11 NOTE — Progress Notes (Signed)
Date and time results received: 06/11/23 1400 (use smartphrase ".now" to insert current time)  Test: Venous blood gas Critical Value: pCO2 76, pO2 31  Name of Provider Notified: Dr. Marin Olp  Orders Received? Or Actions Taken?:  no new orders

## 2023-06-11 NOTE — Evaluation (Signed)
Physical Therapy Evaluation Patient Details Name: [name] MRN: [id number] DOB: 11-14-1940 Today's Date: 06/11/2023  History of Present Illness  Pt is a 81 y/o female presenting on 8/9 with abdominal pain and HTN. Admitted with HTN emergency. S/P TEE 8/16. Blood cultures with bacteremia, but etiology unknown.  PICC line placed 8/17. PMH includes: anxiety, arthritis, CAD, skin CA, cataracts, CHF, COPD, generalized convulsive epilepsy, osteoporosis, seizures.  Clinical Impression   Pt admitted with above diagnosis. Lives at home with husband, in a single-level home with 1 steps to enter; Prior to admission, pt was independent, uses supplemental O2, 2L typically; Limited by activity tolerance and fatigue today, on 11L HFNC with SPO2 maintained >90% majority of session (1 desaturation to 89% after returning back to bed but recovered quickly with PLB). She requires min assist for bed mobility, min assist to min guard for sitting EOB;  Limited by fatigue and general malaise today; Pt currently with functional limitations due to the deficits listed below (see PT Problem List). Pt will benefit from skilled PT to increase their independence and safety with mobility to allow discharge to the venue listed below.           If plan is discharge home, recommend the following: A lot of help with walking and/or transfers;A lot of help with bathing/dressing/bathroom   Can travel by private vehicle        Equipment Recommendations Rollator (4 wheels);Other (comment) (will consider rollator)  Recommendations for Other Services       Functional Status Assessment Patient has had a recent decline in their functional status and demonstrates the ability to make significant improvements in function in a reasonable and predictable amount of time.     Precautions / Restrictions Precautions Precautions: Fall;Other (comment) Precaution Comments: watch O2 Restrictions Weight Bearing Restrictions: No       Mobility  Bed Mobility Overal bed mobility: Needs Assistance Bed Mobility: Supine to Sit, Sit to Supine     Supine to sit: Min assist Sit to supine: Min assist   General bed mobility comments: increased time, assist for trunk minimally    Transfers                   General transfer comment: deferred, pt declined due to poor tolerance    Ambulation/Gait                  Stairs            Wheelchair Mobility     Tilt Bed    Modified Rankin (Stroke Patients Only)       Balance Overall balance assessment: Needs assistance Sitting-balance support: No upper extremity supported, Feet supported Sitting balance-Leahy Scale: Fair Sitting balance - Comments: moments of min assist when fatigued but overall supervision                                     Pertinent Vitals/Pain Pain Assessment Pain Assessment: No/denies pain    Home Living Family/patient expects to be discharged to:: Private residence Living Arrangements: Spouse/significant other Available Help at Discharge: Family;Available 24 hours/day Type of Home: House Home Access: Stairs to enter Entrance Stairs-Rails: Doctor, general practice of Steps: 1   Home Layout: One level Home Equipment: Other (comment);BSC/3in1;Rolling Walker (2 wheels);Cane - single point (home O2) Additional Comments: typically uses 2 L [place]    Prior Function Prior Level of Function : Independent/Modified  Independent;Driving             Mobility Comments: independent with no AD ADLs Comments: independent ADLs, light IADLs     Extremity/Trunk Assessment   Upper Extremity Assessment Upper Extremity Assessment: Defer to OT evaluation    Lower Extremity Assessment Lower Extremity Assessment: Generalized weakness       Communication   Communication Communication: No apparent difficulties  Cognition Arousal: Lethargic Behavior During Therapy: WFL for tasks  assessed/performed Overall Cognitive Status: Within Functional Limits for tasks assessed                                 General Comments: some slow processing but antiipcate from lethargy        General Comments General comments (skin integrity, edema, etc.): pt on 11 L HFNC, SpO2 to 89% but recovered with PLB; HR stable    Exercises     Assessment/Plan    PT Assessment Patient needs continued PT services  PT Problem List Decreased strength;Decreased activity tolerance;Decreased balance;Decreased mobility;Decreased knowledge of use of DME;Decreased safety awareness;Decreased knowledge of precautions;Cardiopulmonary status limiting activity       PT Treatment Interventions DME instruction;Gait training;Stair training;Functional mobility training;Therapeutic activities;Therapeutic exercise;Balance training;Patient/family education;Neuromuscular re-education;Cognitive remediation    PT Goals (Current goals can be found in the Care Plan section)  Acute Rehab PT Goals Patient Stated Goal: to rest PT Goal Formulation: With patient Time For Goal Achievement: 06/26/23 Potential to Achieve Goals: Good    Frequency Min 1X/week     Co-evaluation PT/OT/SLP Co-Evaluation/Treatment: Yes Reason for Co-Treatment: To address functional/ADL transfers;Other (comment) (activity tolerance) PT goals addressed during session: Mobility/safety with mobility OT goals addressed during session: ADL's and self-care       AM-PAC PT "6 Clicks" Mobility  Outcome Measure Help needed turning from your back to your side while in a flat bed without using bedrails?: A Little Help needed moving from lying on your back to sitting on the side of a flat bed without using bedrails?: A Little Help needed moving to and from a bed to a chair (including a wheelchair)?: A Lot Help needed standing up from a chair using your arms (e.g., wheelchair or bedside chair)?: A Lot Help needed to walk in  hospital room?: A Lot Help needed climbing 3-5 steps with a railing? : A Lot 6 Click Score: 14    End of Session Equipment Utilized During Treatment: Oxygen Activity Tolerance: Patient limited by fatigue Patient left: in bed;with call bell/phone within reach;with bed alarm set Nurse Communication: Mobility status PT Visit Diagnosis: Other abnormalities of gait and mobility (R26.89);Muscle weakness (generalized) (M62.81);Other (comment) (decr functional cqapacity)    Time: 4098-1191 PT Time Calculation (min) (ACUTE ONLY): 22 min   Charges:   PT Evaluation $PT Eval Moderate Complexity: 1 Mod   PT General Charges $$ ACUTE PT VISIT: 1 Visit         [name], PT  Acute Rehabilitation Services Office [phone] Secure Chat welcomed   [name] 06/11/2023, 2:18 PM

## 2023-06-11 NOTE — Evaluation (Signed)
Occupational Therapy Evaluation Patient Details Name: [name] MRN: [id number] DOB: 1941-06-29 Today's Date: 06/11/2023   History of Present Illness Pt is a 82 y/o female presenting on 8/9 with abdominal pain and HTN. Admitted with HTN emergency. S/P TEE 8/16. Blood cultures with bacteremia, but etiology unknown.  PICC line placed 8/17. PMH includes: anxiety, arthritis, CAD, skin CA, cataracts, CHF, COPD, generalized convulsive epilepsy, osteoporosis, seizures.   Clinical Impression   PTA patient reports independent with ADLs, light iADLs and mobility. Admitted for above and presents with problem list below.  Limited by activity tolerance and fatigue today, on 11L HFNC with SPO2 maintained >90% majority of session (1 desaturation to 89% after returning back to bed but recovered quickly with PLB).  She requires min assist for bed mobility, min assist to min guard for sitting EOB, and setup to max assist for ADLs.  Based on performance today, believe she will best benefit from continued OT services acutely and after dc at [facility] level (pending progress) to optimize independence, safety and return to PLOF. Will follow.      If plan is discharge home, recommend the following: A lot of help with bathing/dressing/bathroom;A lot of help with walking and/or transfers;Direct supervision/assist for medications management;Assistance with cooking/housework;Direct supervision/assist for financial management;Assist for transportation;Help with stairs or ramp for entrance    Functional Status Assessment  Patient has had a recent decline in their functional status and demonstrates the ability to make significant improvements in function in a reasonable and predictable amount of time.  Equipment Recommendations  None recommended by OT (has RW and 3:1)    Recommendations for Other Services       Precautions / Restrictions Precautions Precautions: Fall;Other (comment) Precaution Comments: watch  O2 Restrictions Weight Bearing Restrictions: No      Mobility Bed Mobility Overal bed mobility: Needs Assistance Bed Mobility: Supine to Sit, Sit to Supine     Supine to sit: Min assist Sit to supine: Min assist   General bed mobility comments: increased time, assist for trunk minimally    Transfers                   General transfer comment: deferred, pt declined due to poor tolerance      Balance Overall balance assessment: Needs assistance Sitting-balance support: No upper extremity supported, Feet supported Sitting balance-Leahy Scale: Fair Sitting balance - Comments: moments of min assist when fatigued but overall supervision                                   ADL either performed or assessed with clinical judgement   ADL Overall ADL's : Needs assistance/impaired     Grooming: Contact guard assist;Sitting           Upper Body Dressing : Sitting;Minimal assistance   Lower Body Dressing: Sitting/lateral leans;Bed level;Maximal assistance     Toilet Transfer Details (indicate cue type and reason): deferred d/t decreased tolerance         Functional mobility during ADLs: Minimal assistance General ADL Comments: limited to EOB due to decreased tolerance     Vision   Vision Assessment?: No apparent visual deficits     Perception         Praxis         Pertinent Vitals/Pain Pain Assessment Pain Assessment: No/denies pain     Extremity/Trunk Assessment Upper Extremity Assessment Upper Extremity Assessment: Generalized weakness  Lower Extremity Assessment Lower Extremity Assessment: Defer to PT evaluation       Communication Communication Communication: No apparent difficulties   Cognition Arousal: Lethargic Behavior During Therapy: WFL for tasks assessed/performed Overall Cognitive Status: Within Functional Limits for tasks assessed                                 General Comments: some slow  processing but antiipcate from lethargy     General Comments  pt on 11 L HFNC, SpO2 to 89% but recovered with PLB; HR stable    Exercises     Shoulder Instructions      Home Living Family/patient expects to be discharged to:: Private residence Living Arrangements: Spouse/significant other Available Help at Discharge: Family;Available 24 hours/day Type of Home: House Home Access: Stairs to enter [employer] of Steps: 1 Entrance Stairs-Rails: Right;Left Home Layout: One level     Bathroom Shower/Tub: Sponge bathes at baseline   [employer]: Standard     Home Equipment: Other (comment);BSC/3in1;Rolling Walker (2 wheels);Cane - single point (home O2)   Additional Comments: typically uses 2 L [place]      Prior Functioning/Environment Prior Level of Function : Independent/Modified Independent;Driving             Mobility Comments: independent with no AD ADLs Comments: independent ADLs, light IADLs        OT Problem List: Decreased strength;Decreased activity tolerance;Cardiopulmonary status limiting activity;Decreased knowledge of precautions;Decreased knowledge of use of DME or AE      OT Treatment/Interventions: Self-care/ADL training;Energy conservation;DME and/or AE instruction;Therapeutic exercise;Therapeutic activities;Balance training;Patient/family education    OT Goals(Current goals can be found in the care plan section) Acute Rehab OT Goals Patient Stated Goal: get better OT Goal Formulation: With patient Time For Goal Achievement: 06/25/23 Potential to Achieve Goals: Good  OT Frequency: Min 1X/week    Co-evaluation PT/OT/SLP Co-Evaluation/Treatment: Yes Reason for Co-Treatment: To address functional/ADL transfers;Other (comment) (activity tolerance)   OT goals addressed during session: ADL's and self-care      AM-PAC OT "6 Clicks" Daily Activity     Outcome Measure Help from another person eating meals?: None Help from another person  taking care of personal grooming?: A Little Help from another person toileting, which includes using toliet, bedpan, or urinal?: Total Help from another person bathing (including washing, rinsing, drying)?: A Lot Help from another person to put on and taking off regular upper body clothing?: A Little Help from another person to put on and taking off regular lower body clothing?: Total 6 Click Score: 14   End of Session Equipment Utilized During Treatment: Oxygen Nurse Communication: Mobility status  Activity Tolerance: Patient limited by fatigue Patient left: in bed;with call bell/phone within reach;with bed alarm set  OT Visit Diagnosis: Other abnormalities of gait and mobility (R26.89);Muscle weakness (generalized) (M62.81);Other (comment) (decreased activity tolerance)                Time: 1191-4782 OT Time Calculation (min): 22 min Charges:  OT General Charges $OT Visit: 1 Visit OT Evaluation $OT Eval Moderate Complexity: 1 Mod  Barry Brunner, OT Acute Rehabilitation Services Office [phone]   Chancy Milroy 06/11/2023, 11:52 AM

## 2023-06-12 ENCOUNTER — Inpatient Hospital Stay (HOSPITAL_COMMUNITY): Payer: No Typology Code available for payment source

## 2023-06-12 DIAGNOSIS — B952 Enterococcus as the cause of diseases classified elsewhere: Secondary | ICD-10-CM | POA: Diagnosis not present

## 2023-06-12 DIAGNOSIS — R7881 Bacteremia: Secondary | ICD-10-CM | POA: Diagnosis not present

## 2023-06-12 LAB — COMPREHENSIVE METABOLIC PANEL
ALT: 68 U/L — ABNORMAL HIGH (ref 0–44)
AST: 28 U/L (ref 15–41)
Albumin: 2.8 g/dL — ABNORMAL LOW (ref 3.5–5.0)
Alkaline Phosphatase: 50 U/L (ref 38–126)
Anion gap: 10 (ref 5–15)
BUN: 28 mg/dL — ABNORMAL HIGH (ref 8–23)
CO2: 37 mmol/L — ABNORMAL HIGH (ref 22–32)
Calcium: 7.7 mg/dL — ABNORMAL LOW (ref 8.9–10.3)
Chloride: 86 mmol/L — ABNORMAL LOW (ref 98–111)
Creatinine, Ser: 1.21 mg/dL — ABNORMAL HIGH (ref 0.44–1.00)
GFR, Estimated: 45 mL/min — ABNORMAL LOW (ref >60)
Glucose, Bld: 202 mg/dL — ABNORMAL HIGH (ref 70–99)
Potassium: 3.9 mmol/L (ref 3.5–5.1)
Sodium: 133 mmol/L — ABNORMAL LOW (ref 135–145)
Total Bilirubin: 0.5 mg/dL (ref 0.3–1.2)
Total Protein: 5.3 g/dL — ABNORMAL LOW (ref 6.5–8.1)

## 2023-06-12 LAB — CBC
HCT: 30.9 % — ABNORMAL LOW (ref 36.0–46.0)
Hemoglobin: 9.4 g/dL — ABNORMAL LOW (ref 12.0–15.0)
MCH: 24.1 pg — ABNORMAL LOW (ref 26.0–34.0)
MCHC: 30.4 g/dL (ref 30.0–36.0)
MCV: 79.2 fL — ABNORMAL LOW (ref 80.0–100.0)
Platelets: 331 10*3/uL (ref 150–400)
RBC: 3.9 MIL/uL (ref 3.87–5.11)
RDW: 22.1 % — ABNORMAL HIGH (ref 11.5–15.5)
WBC: 9.2 10*3/uL (ref 4.0–10.5)
nRBC: 0.9 % — ABNORMAL HIGH (ref 0.0–0.2)

## 2023-06-12 LAB — HEMOGLOBIN AND HEMATOCRIT, BLOOD
HCT: 29.8 % — ABNORMAL LOW (ref 36.0–46.0)
Hemoglobin: 8.9 g/dL — ABNORMAL LOW (ref 12.0–15.0)

## 2023-06-12 LAB — PROCALCITONIN: Procalcitonin: <0.1 ng/mL

## 2023-06-12 LAB — MAGNESIUM: Magnesium: 2.1 mg/dL (ref 1.7–2.4)

## 2023-06-12 MED ORDER — ENSURE ENLIVE PO LIQD
237.0000 mL | Freq: Two times a day (BID) | ORAL | Status: DC
  Administered 2023-06-12 – 2023-06-17 (×7): 237 mL via ORAL

## 2023-06-12 MED ORDER — IPRATROPIUM BROMIDE 0.02 % IN SOLN
0.5000 mg | Freq: Two times a day (BID) | RESPIRATORY_TRACT | Status: DC
  Administered 2023-06-12 – 2023-06-17 (×10): 0.5 mg via RESPIRATORY_TRACT
  Filled 2023-06-12 (×10): qty 2.5

## 2023-06-12 MED ORDER — LEVALBUTEROL HCL 1.25 MG/0.5ML IN NEBU
1.2500 mg | INHALATION_SOLUTION | Freq: Two times a day (BID) | RESPIRATORY_TRACT | Status: DC
  Administered 2023-06-12 – 2023-06-17 (×6): 1.25 mg via RESPIRATORY_TRACT
  Filled 2023-06-12 (×12): qty 0.5

## 2023-06-12 NOTE — Consult Note (Signed)
NAME:  [name], MRN:  [id number], DOB:  04/01/1941, LOS: 10 ADMISSION DATE:  06/02/2023, CONSULTATION DATE:  *** REFERRING MD:  ***, CHIEF COMPLAINT:  ***   History of Present Illness:  ***  Pertinent  Medical History  ***  Significant Hospital Events: Including procedures, antibiotic start and stop dates in addition to other pertinent events     Interim History / Subjective:  ***  Objective   Blood pressure 138/73, pulse 80, temperature 97.8 F (36.6 C), temperature source Oral, resp. rate 16, height 5' (1.524 m), weight 57.4 kg, SpO2 99%.        Intake/Output Summary (Last 24 hours) at 06/12/2023 1827 Last data filed at 06/12/2023 1738 Gross per 24 hour  Intake 300 ml  Output 1750 ml  Net -1450 ml   Filed Weights   06/10/23 0529 06/11/23 0300 06/12/23 0318  Weight: 54.2 kg 56.2 kg 57.4 kg    Examination: General: *** HENT: *** Lungs: *** Cardiovascular: *** Abdomen: *** Extremities: *** Neuro: *** GU: ***  Resolved Hospital Problem list   ***  Assessment & Plan:  ***  Best Practice (right click and "Reselect all SmartList Selections" daily)   Diet/type: {diet type:25684} DVT prophylaxis: {anticoagulation (Optional):25687} GI prophylaxis: {DP:82423} Lines: {Central Venous Access:25771} Foley:  {Central Venous Access:25691} Code Status:  {Code Status:26939} Last date of multidisciplinary goals of care discussion [***]  Labs   CBC: Recent Labs  Lab 06/09/23 1455 06/10/23 0239 06/11/23 0028 06/12/23 0036 06/12/23 0922  WBC 14.3* 9.5 11.7*  --  9.2  HGB 9.8* 9.4* 9.2* 8.9* 9.4*  HCT 31.9* 30.2* 30.3* 29.8* 30.9*  MCV 80.8 77.6* 80.4  --  79.2*  PLT 342 285 301  --  331    Basic Metabolic Panel: Recent Labs  Lab 06/06/23 0042 06/08/23 0815 06/10/23 0239 06/10/23 0645 06/11/23 0028 06/12/23 0140  NA 126* 134* 135 136 138 133*  K 3.3* 3.7 2.9* 5.1 3.9 3.9  CL 83* 94* 89* 91* 89* 86*  CO2 31 28 33* 33* 38* 37*  GLUCOSE 124*  147* 154* 161* 168* 202*  BUN 9 23 27* 28* 32* 28*  CREATININE 0.83 1.10* 0.97 1.04* 1.24* 1.21*  CALCIUM 7.7* 7.7* 7.4* 7.5* 7.6* 7.7*  MG 1.8 2.3 2.2 2.1 2.2 2.1  PHOS 2.8  --   --   --   --   --    GFR: Estimated Creatinine Clearance: 29.4 mL/min (A) (by C-G formula based on SCr of 1.21 mg/dL (H)). Recent Labs  Lab 06/09/23 1455 06/10/23 0239 06/11/23 0028 06/11/23 1334 06/12/23 0140 06/12/23 0922  PROCALCITON  --   --   --  <0.10 <0.10  --   WBC 14.3* 9.5 11.7*  --   --  9.2    Liver Function Tests: Recent Labs  Lab 06/11/23 0028 06/12/23 0140  AST 26 28  ALT 71* 68*  ALKPHOS 52 50  BILITOT 0.6 0.5  PROT 5.5* 5.3*  ALBUMIN 3.0* 2.8*   No results for input(s): "LIPASE", "AMYLASE" in the last 168 hours. No results for input(s): "AMMONIA" in the last 168 hours.  ABG    Component Value Date/Time   HCO3 43.9 (H) 06/11/2023 1334   O2SAT 43 06/11/2023 1334     Coagulation Profile: No results for input(s): "INR", "PROTIME" in the last 168 hours.  Cardiac Enzymes: No results for input(s): "CKTOTAL", "CKMB", "CKMBINDEX", "TROPONINI" in the last 168 hours.  HbA1C: No results found for: "HGBA1C"  CBG: Recent Labs  Lab 06/10/23 2006  GLUCAP 220*    Review of Systems:   ***  Past Medical History:  She,  has a past medical history of Allergy, Anxiety, Arthritis, Asthma, Bronchitis, CAD (coronary artery disease), Cancer (HCC), Carotid artery occlusion, Cataracts, bilateral, CHF (congestive heart failure) (HCC), Chronic airway obstruction, not elsewhere classified, COPD (chronic obstructive pulmonary disease) (HCC), Dysphagia, unspecified(787.20), Generalized convulsive epilepsy without mention of intractable epilepsy, GERD (gastroesophageal reflux disease), Headache, Hearing impaired, Hyperlipidemia, Hypersomnia with sleep apnea, unspecified, Osteoporosis, Oxygen desaturation during sleep, Paroxysmal atrial tachycardia, PONV (postoperative nausea and vomiting), Pure  hypercholesterolemia, Reflux, Rheumatic fever, Seizures (HCC), Shortness of breath (03/11/2010), Wears glasses, and Wears hearing aids.   Surgical History:   Past Surgical History:  Procedure Laterality Date   ABDOMINAL HYSTERECTOMY     ABLATION     x 3   CAROTID ENDARTERECTOMY  11/11/2010   left with Dacron patch angioplasty   CESAREAN SECTION     2 c-sections   ENDARTERECTOMY Right 07/19/2016   Procedure: ENDARTERECTOMY RIGHT CAROTID ARTERY;  Surgeon: Chuck Hint, MD;  Location: [facility] OR;  Service: Vascular;  Laterality: Right;   HERNIA REPAIR     PATCH ANGIOPLASTY Right 07/19/2016   Procedure: PATCH ANGIOPLASTY RIGHT CAROTID ARTERY USING HEMASHIELD PLATINUM FINESSE PATCH;  Surgeon: Chuck Hint, MD;  Location: [facility] OR;  Service: Vascular;  Laterality: Right;   SKIN CANCER EXCISION     TEE WITHOUT CARDIOVERSION N/A 06/09/2023   Procedure: TRANSESOPHAGEAL ECHOCARDIOGRAM;  Surgeon: Sande Rives, MD;  Location: [facility] INVASIVE CV LAB;  Service: Cardiovascular;  Laterality: N/A;   TONSILLECTOMY AND ADENOIDECTOMY       Social History:   reports that she has been smoking cigarettes. She has a 60 pack-year smoking history. She has never used smokeless tobacco. She reports that she does not drink alcohol and does not use drugs.   Family History:  Her family history includes AAA (abdominal aortic aneurysm) in her mother; Asthma in her father; Cancer in her brother and sister; Deep vein thrombosis in her mother; Heart attack in her father and mother; Heart disease in her father and mother; Hyperlipidemia in her brother, father, mother, and sister; Hypertension in her father, mother, and sister.   Allergies Allergies  Allergen Reactions   Augmentin [Amoxicillin-Pot Clavulanate] Shortness Of Breath and Dermatitis    Patient has tolerated amoxicillin as well as cefdinir in the past.  Several times.  Husband/patient feels that her allergy is to the other agent and Augmentin,  namely clavulanic acid   Codeine Shortness Of Breath   Pravastatin Other (See Comments)    Give muscle aches and weakness    Sulfa Antibiotics    Contrast Media [Iodinated Contrast Media] Rash    Itching and rash   Metrizamide Rash    Itching and rash     Home Medications  Prior to Admission medications   Medication Sig Start Date End Date Taking? Authorizing Provider  acetaminophen (TYLENOL) 325 MG tablet Take 650 mg by mouth every 6 (six) hours as needed.   Yes [provider]  albuterol (VENTOLIN HFA) 108 (90 Base) MCG/ACT inhaler 2 inhalations every 4-6 hrs 02/03/20  Yes Kozlow, Alvira Philips, MD  alendronate (FOSAMAX) 70 MG tablet Take 70 mg by mouth once a week. 07/23/20  Yes [provider]  aspirin 81 MG tablet Take 81 mg by mouth 2 (two) times daily.   Yes [provider]  ELIQUIS 2.5 MG TABS tablet Take 2.5 mg by mouth 2 (  two) times daily. 07/21/22  Yes [provider]  ferrous sulfate 325 (65 FE) MG tablet Take 325 mg by mouth daily before breakfast. 05/13/23 06/12/23 Yes [provider]  Fluticasone-Umeclidin-Vilant (TRELEGY ELLIPTA) 200-62.5-25 MCG/INH AEPB Inhale one dose once daily to prevent cough or wheeze.  Rinse, gargle, and spit after use. 04/14/21  Yes Kozlow, Alvira Philips, MD  furosemide (LASIX) 40 MG tablet Take 40 mg by mouth daily. 03/17/21  Yes [provider]  hydrOXYzine (ATARAX) 25 MG tablet Take 25 mg by mouth 3 (three) times daily as needed for anxiety. 10/06/22  Yes [provider]  ipratropium-albuterol (DUONEB) 0.5-2.5 (3) MG/3ML SOLN Take by nebulization. 02/16/21  Yes [provider]  LORazepam (ATIVAN) 0.5 MG tablet Take 0.5 mg by mouth daily as needed for anxiety. 01/17/20  Yes [provider]  Magnesium 400 MG TABS Take by mouth in the morning and at bedtime.   Yes [provider]  montelukast (SINGULAIR) 10 MG tablet TAKE 1 TABLET BY MOUTH AT BEDTIME 10/04/21  Yes Kozlow, Alvira Philips, MD   pantoprazole (PROTONIX) 40 MG tablet Take 1 tablet (40 mg total) by mouth daily. 12/14/21  Yes Kozlow, Alvira Philips, MD  albuterol (PROVENTIL) (2.5 MG/3ML) 0.083% nebulizer solution Take 2.5 mg by nebulization every 6 (six) hours as needed for wheezing or shortness of breath.    [provider]  doxycycline (VIBRA-TABS) 100 MG tablet Take 100 mg by mouth 2 (two) times daily. Patient not taking: Reported on 06/02/2023 05/13/23   [provider]  ipratropium (ATROVENT) 0.02 % nebulizer solution Take 0.5 mg by nebulization 4 (four) times daily.    [provider]  nystatin cream (MYCOSTATIN) SMARTSIG:Vaginal 3-4 Times Daily Patient not taking: Reported on 04/14/2021 02/04/21   [provider]  predniSONE (STERAPRED UNI-PAK 48 TAB) 5 MG (48) TBPK tablet Take 5 mg by mouth as directed. Patient not taking: Reported on 06/02/2023 04/08/21   [provider]  Spacer/Aero-Holding Chambers (AEROCHAMBER PLUS) inhaler Use as directed with inhaler. 04/08/20   Kozlow, Alvira Philips, MD     Critical care time: ***

## 2023-06-12 NOTE — Progress Notes (Signed)
PT Cancellation Note  Patient Details Name: CINDYLEE RUITER MRN: [id number] DOB: [birth year]   Cancelled Treatment:    Reason Eval/Treat Not Completed: Fatigue/lethargy limiting ability to participate. Pt had meds prior to CT and is currently sleeping soundly and not awakening to verbal or tactile stimuli.   Angelina Ok [facility] 06/12/2023, 2:50 PM Skip Mayer PT Acute [employer] [phone]

## 2023-06-12 NOTE — Progress Notes (Signed)
Progress Note   Patient: [name] ZOX:[id number] DOB: [birth year] DOA: 06/02/2023     10 DOS: the patient was seen and examined on 06/12/2023   Brief hospital course:  Patient is an 82 year old female with past medical history significant for COPD on home oxygen, coronary artery disease, carotid artery occlusion, congestive heart failure, hyperlipidemia, paroxysmal atrial tachycardia, rheumatic fever and seizure.  There is also documentation of asthma.  Patient has chronic low back pain.  Patient was admitted with hypertensive emergency (systolic blood pressure of 240 mmHg).  Documented leg edema.  Leg edema has resolved.  Blood pressure slowly improving.   06/08/2023: Awaiting TEE.  TEE is planned for tomorrow.  Blood pressure is controlled.  Continue antibiotics.  Input from EP and infectious disease team is appreciated.   06/09/2023: Patient underwent TEE today of without significant finding.  Will proceed with CT scan of the abdomen and pelvis.  Patient has contrast dye allergy.  Patient will need to be pretreated.  Infectious disease input is appreciated.  Patient will complete antibiotics on 07/17/2023.  Mild shortness of breath reported after TEE.  Patient was noted to have chest tightness/wheezing.  Patient has COPD, is currently being treated for exacerbation.  Cardiac BNP was not elevated.  IV Lasix 40 Mg twice daily x 2 doses and reevaluate.  Continue COPD management.  Pursue disposition after reviewing CT abdomen and pelvis.  Complete course of antibiotics on discharge.  See OPAT.   8/17: CT abd/pelvis unremarkable. insurance has not approved OPAT. 8/18: increased O2 requirement overnight, started on COPD treatment. CXR read with possible malpositioned RA lead, recommend cardiology curbside 8/19. 8/19 : Remains on 6 L of oxygen above her home baseline of 2 L.  Appears short of breath.  CT scan of the chest without contrast shows complete left lower lobe collapse and small  left-greater-than-right pleural effusions. Mild consolidation of the posterior right upper lobe and irregular solid nodule of the left upper lobe measuring 6 mm, likely infectious. Recommend follow-up chest CT in 3 months to ensure resolution. Severe age-indeterminate compression deformity of T8 and moderate age indeterminate compression deformity of T9. Correlate for point tenderness., Aortic Atherosclerosis (ICD10-I70.0) and Emphysema (ICD10-J43.9).  Pulmonary consulted.   Assessment and Plan:  Acute on chronic hypoxic respiratory failure Patient noted to have an increased oxygen requirement and is currently on 6 L nasal cannula to maintain pulse oximetry greater than 92% above her baseline of 2 L. Imaging shows complete left lower lobe collapse and small left-greater-than-right pleural effusions. Mild consolidation of the posterior right upper lobe and irregular solid nodule of the left upper lobe measuring 6 mm, likely infectious.  Recommend follow-up chest CT in 3 months to ensure resolution. Pulmonary consult placed for evaluation for possible bronchoscopy   Hypertensive emergency: On presentation, systolic blood pressure of 240 mmHg was started on Cardizem oral/drip.  Likely 2/2 back pain. Improved on orals.  -Continue Coreg 3.125 Mg p.o. twice daily as well as diltiazem 120 mg daily -Goal blood pressure should be less than 130/80 mmHg.   -Optimize pain control and alleviate any inconveniences.     COPD with acute exacerbation  On 2 L O2 at home 06/09/2023: Mild exacerbation with increased dyspnea and sputum noted after TEE. Pred 40 mg once daily x 5 days, switched to iv methylpred 8/18, started levaquin due to worsening respiratory status overnight.  -Continue nebulizer treatment. -Continue Singulair, xopenex scheduled -started levaquin 500 mg once daily  -iv methylpred 8/18 -scheduled duo-nebs  Enterococcus Bacteremia: 2 out of blood cultures said to be growing  Enterococcus, etiology unknown. CT abd/pelvis normal, TEE ruled out infected leads.  -ID team consulted, recommending 6 weeks of OPAT with ampicillin -insurance still approving OPAT -PICC lined placed 8/17    Anemia -Iron is 23, TIBC of 451, ferritin of 47. -B12 is 5687. -Folate level is 31.3. -Suggestive of iron deficiency anemia, possibly blood loss anemia. -Start ferrous sulfate. -GI follow-up on discharge.  Patient may need EGD and, if indicated lower GI scope as well   A-fib (HCC) -Chronic diagnosis, continue apixaban 2.5 mg BID -Rate controlled.   Constipation/gaseous abdominal distention: -Try soapsuds enema. -Laxatives. -Patient may need peripheral opiate blocker. -Minimize opiates as much as possible.        Subjective: Patient is seen and examined at the bedside.  Complains of back pain.  Husband is at the bedside and notes that she has compression fractures with chronic back pain.  Physical Exam: Vitals:   06/12/23 1100 06/12/23 1311 06/12/23 1337 06/12/23 1530  BP: (!) 142/84     Pulse: 80     Resp: 16     Temp: 97.8 F (36.6 C)     TempSrc: Oral     SpO2:  90% 98% 99%  Weight:      Height:       General exam: Acutely ill-appearing Respiratory system: Absent breath sounds involving the left lower lobe, scattered wheezes in all lung fields Cardiovascular system: S1 & S2 heard Gastrointestinal system: Abdomen is soft and nontender.  Central nervous system: Alert and oriented.  Patient moves all extremities.   Extremities: No leg edema.   Data Reviewed: Labs reviewed.  Sodium 133, creatinine 1.2, There are no new results to review at this time.  Family Communication: Plan of care discussed with patient and her husband at the bedside.  All questions and concerns have been addressed.  They verbalized understanding and agree with the plan.  Disposition: Status is: Inpatient Remains inpatient appropriate because: Requires long-term IV antibiotics for  Enterococcus bacteremia  Planned Discharge Destination: Skilled nursing facility    Time spent: 36 minutes  Author: Lucile Shutters, MD 06/12/2023 4:14 PM  For on call review [email or web address].

## 2023-06-12 NOTE — Progress Notes (Signed)
PT Cancellation Note  Patient Details Name: [name] MRN: [id number] DOB: 1941/10/16   Cancelled Treatment:    Reason Eval/Treat Not Completed: Fatigue/lethargy limiting ability to participate. Pt remains very sleepy after meds for CT. Will try again tomorrow.    Angelina Ok [facility] 06/12/2023, 5:58 PM Skip Mayer PT Acute [employer] [phone]

## 2023-06-12 NOTE — TOC Progression Note (Signed)
Transition of Care [facility]) - Progression Note    Patient Details  Name: AMIEL SPEERS MRN: [id number] Date of Birth: 1941/01/10  [facility]) CM/SW Contact  Epifanio Lesches, RN Phone Number: 06/12/2023, 11:45 AM  Clinical Narrative:    [facility] unable to to provide home infusion services 2/2 out of network. Referral made with [facility], [phone] for home infusion services ( IV ABX therapy and [facility]), acceptance pending.  TOC team following and will continue assisting with needs...  Expected Discharge Plan: Home w Home Health Services Barriers to Discharge: Continued Medical Work up  Expected Discharge Plan and Services In-house Referral: NA Discharge Planning Services: CM Consult Post Acute Care Choice: Home Health Living arrangements for the past 2 months: Single Family Home                   DME Agency: NA                   Social Determinants of Health (SDOH) Interventions SDOH Screenings   Food Insecurity: No Food Insecurity (06/03/2023)  Housing: Low Risk  (06/03/2023)  Transportation Needs: No Transportation Needs (06/03/2023)  Utilities: Not At Risk (06/03/2023)  Tobacco Use: High Risk (06/09/2023)    Readmission Risk Interventions     No data to display

## 2023-06-12 NOTE — Consult Note (Signed)
Full note to follow, query aspiration during TEE. Brownish sputum, culture pending. Really needs to work on nutrition and mobility. No indication for bronch or any pleural intervention at this time.  Myrla Halsted MD PCCM

## 2023-06-13 DIAGNOSIS — J441 Chronic obstructive pulmonary disease with (acute) exacerbation: Secondary | ICD-10-CM | POA: Diagnosis not present

## 2023-06-13 DIAGNOSIS — I480 Paroxysmal atrial fibrillation: Secondary | ICD-10-CM | POA: Diagnosis not present

## 2023-06-13 DIAGNOSIS — R7881 Bacteremia: Secondary | ICD-10-CM | POA: Diagnosis not present

## 2023-06-13 DIAGNOSIS — L989 Disorder of the skin and subcutaneous tissue, unspecified: Secondary | ICD-10-CM | POA: Diagnosis not present

## 2023-06-13 LAB — COMPREHENSIVE METABOLIC PANEL
ALT: 56 U/L — ABNORMAL HIGH (ref 0–44)
AST: 23 U/L (ref 15–41)
Albumin: 2.8 g/dL — ABNORMAL LOW (ref 3.5–5.0)
Alkaline Phosphatase: 45 U/L (ref 38–126)
Anion gap: 10 (ref 5–15)
BUN: 33 mg/dL — ABNORMAL HIGH (ref 8–23)
CO2: 39 mmol/L — ABNORMAL HIGH (ref 22–32)
Calcium: 8.4 mg/dL — ABNORMAL LOW (ref 8.9–10.3)
Chloride: 85 mmol/L — ABNORMAL LOW (ref 98–111)
Creatinine, Ser: 0.96 mg/dL (ref 0.44–1.00)
GFR, Estimated: 60 mL/min — ABNORMAL LOW (ref >60)
Glucose, Bld: 166 mg/dL — ABNORMAL HIGH (ref 70–99)
Potassium: 3.7 mmol/L (ref 3.5–5.1)
Sodium: 134 mmol/L — ABNORMAL LOW (ref 135–145)
Total Bilirubin: 0.8 mg/dL (ref 0.3–1.2)
Total Protein: 5.3 g/dL — ABNORMAL LOW (ref 6.5–8.1)

## 2023-06-13 LAB — ALDOSTERONE + RENIN ACTIVITY W/ RATIO
ALDO / PRA Ratio: 2.4 (ref 0.0–30.0)
Aldosterone: 18.1 ng/dL (ref 0.0–30.0)
PRA LC/MS/MS: 7.648 ng/mL/h — ABNORMAL HIGH (ref 0.167–5.380)

## 2023-06-13 LAB — CBC
HCT: 30.6 % — ABNORMAL LOW (ref 36.0–46.0)
Hemoglobin: 9.4 g/dL — ABNORMAL LOW (ref 12.0–15.0)
MCH: 24.7 pg — ABNORMAL LOW (ref 26.0–34.0)
MCHC: 30.7 g/dL (ref 30.0–36.0)
MCV: 80.5 fL (ref 80.0–100.0)
Platelets: 350 10*3/uL (ref 150–400)
RBC: 3.8 MIL/uL — ABNORMAL LOW (ref 3.87–5.11)
RDW: 22.2 % — ABNORMAL HIGH (ref 11.5–15.5)
WBC: 12.5 10*3/uL — ABNORMAL HIGH (ref 4.0–10.5)
nRBC: 0.4 % — ABNORMAL HIGH (ref 0.0–0.2)

## 2023-06-13 LAB — MAGNESIUM: Magnesium: 2.3 mg/dL (ref 1.7–2.4)

## 2023-06-13 MED ORDER — AMPICILLIN IV (FOR PTA / DISCHARGE USE ONLY): 12.0000 g | INTRAVENOUS | 0 refills | Status: DC

## 2023-06-13 MED ORDER — LORAZEPAM 0.5 MG PO TABS
0.5000 mg | ORAL_TABLET | Freq: Four times a day (QID) | ORAL | Status: DC | PRN
  Administered 2023-06-13 – 2023-06-16 (×2): 0.5 mg via ORAL
  Filled 2023-06-13 (×2): qty 1

## 2023-06-13 MED ORDER — LEVOFLOXACIN IN D5W 250 MG/50ML IV SOLN
250.0000 mg | INTRAVENOUS | Status: AC
  Administered 2023-06-13 – 2023-06-14 (×2): 250 mg via INTRAVENOUS
  Filled 2023-06-13 (×4): qty 50

## 2023-06-13 NOTE — Progress Notes (Signed)
Physical Therapy Treatment Patient Details Name: [name] MRN: [id number] DOB: 03-Nov-1940 Today's Date: 06/13/2023   History of Present Illness Pt is a 82 y/o female presenting on 8/9 with abdominal pain and HTN. Admitted with HTN emergency. S/P TEE 8/16. Blood cultures with bacteremia, but etiology unknown.  PICC line placed 8/17. PMH includes: anxiety, arthritis, CAD, skin CA, cataracts, CHF, COPD, generalized convulsive epilepsy, osteoporosis, seizures.    PT Comments  Pt very difficult to motivate, states she just doesn't feel well, is anxious, but she is "getting better". Pt declines EOB or OOB activity even with max encouragement and explanation of functional implications, agreeable to repositioning and LE exercises which she tolerated well. PT educated pt on need for daily OOB mobility, pt expresses understanding but says "not right now". Pt plans to d/c home with assist of husband.   SPO2 87-93% on 6LO2    If plan is discharge home, recommend the following: A lot of help with walking and/or transfers;A lot of help with bathing/dressing/bathroom   Can travel by private vehicle        Equipment Recommendations  Rollator (4 wheels);Other (comment) (tbd)    Recommendations for Other Services       Precautions / Restrictions Precautions Precautions: Fall;Other (comment) Precaution Comments: watch O2 Restrictions Weight Bearing Restrictions: No     Mobility  Bed Mobility Overal bed mobility: Needs Assistance Bed Mobility: Rolling Rolling: Min assist, Used rails         General bed mobility comments: boost up in bed max +2 with bed pad, pt declines EOB or OOB. min assist for roll bilat for completion of trunk roll, cues for sequencing    Transfers                   General transfer comment: nt    Ambulation/Gait                   Stairs             Wheelchair Mobility     Tilt Bed    Modified Rankin (Stroke Patients Only)        Balance Overall balance assessment: Needs assistance                                          Cognition Arousal: Alert Behavior During Therapy: WFL for tasks assessed/performed, Anxious Overall Cognitive Status: Within Functional Limits for tasks assessed                                 General Comments: pt requiring mod encouragement to participate, min irritable        Exercises General Exercises - Lower Extremity Heel Slides: AAROM, Both, 15 reps, Supine Hip ABduction/ADduction: AAROM, Both, 15 reps, Supine Shoulder Exercises Shoulder Flexion: AROM, Both, 5 reps, Seated Digit Composite Flexion: AROM, Both, 5 reps, Supine Composite Extension: AROM, Both, 5 reps, Supine    General Comments General comments (skin integrity, edema, etc.): SpO2 87-93% on 6LO2, DOE 2/4      Pertinent Vitals/Pain Pain Assessment Pain Assessment: Faces Faces Pain Scale: Hurts little more Pain Location: back Pain Descriptors / Indicators: Discomfort, Grimacing Pain Intervention(s): Limited activity within patient's tolerance, Monitored during session, Repositioned    Home Living  Prior Function            PT Goals (current goals can now be found in the care plan section) Acute Rehab PT Goals Patient Stated Goal: to rest PT Goal Formulation: With patient Time For Goal Achievement: 06/26/23 Potential to Achieve Goals: Good Progress towards PT goals: Progressing toward goals    Frequency    Min 1X/week      PT Plan      Co-evaluation              AM-PAC PT "6 Clicks" Mobility   Outcome Measure  Help needed turning from your back to your side while in a flat bed without using bedrails?: A Little Help needed moving from lying on your back to sitting on the side of a flat bed without using bedrails?: A Little Help needed moving to and from a bed to a chair (including a wheelchair)?: A Lot Help  needed standing up from a chair using your arms (e.g., wheelchair or bedside chair)?: A Lot Help needed to walk in hospital room?: Total Help needed climbing 3-5 steps with a railing? : Total 6 Click Score: 12    End of Session Equipment Utilized During Treatment: Oxygen Activity Tolerance: Patient limited by fatigue;Other (comment) (anxiety) Patient left: in bed;with call bell/phone within reach;with bed alarm set;with family/visitor present Nurse Communication: Mobility status;Other (comment) (requesting anxiety medication) PT Visit Diagnosis: Other abnormalities of gait and mobility (R26.89);Muscle weakness (generalized) (M62.81);Other (comment)     Time: 5366-4403 PT Time Calculation (min) (ACUTE ONLY): 21 min  Charges:    $Therapeutic Exercise: 8-22 mins PT General Charges $$ ACUTE PT VISIT: 1 Visit                     Marye Round, PT DPT Acute Rehabilitation Services Secure Chat Preferred  Office [phone]    Truddie Coco 06/13/2023, 3:33 PM

## 2023-06-13 NOTE — TOC Progression Note (Addendum)
Transition of Care [facility]) - Progression Note    Patient Details  Name: [name] MRN: [id number] Date of Birth: 1941-06-30  [facility] CM/SW Contact  Janae Bridgeman, RN Phone Number: 06/13/2023, 2:16 PM  Clinical Narrative:    CM called and spoke with Hilton Sinclair, CM with [facility] and they plan to provide IV antibiotics for home and teaching at the bedside today for IV infusion teaching and will also provide [facility] RN for the home for dressing changes for PICC line and reinforced teaching for the home.   I called Mayra Reel , RNCM with [phone] and she accepted the patient for [facility] PT.  [facility] plans to reach out to [facility] and coordinate services at the home.  CM will continue to follow the patient for TOC needs to return home when medically stable for discharge.  06/13/23 1531 - [facility] plans to meet with the patient/family at the bedside to teaching tomorrow on the inpatient unit.  MD is aware and states that patient will be medically stable to discharge tomorrow after teaching.  [facility] is currently trying to contract a [facility] RN but this is still pending at this time.  [facility] states that if she is unable to provide a [facility] RN that the patient will be scheduled at their local infusion center as a back up plan.  [facility] will follow up with RN case management in the am.   Expected Discharge Plan: Home w Home Health Services Barriers to Discharge: Continued Medical Work up  Expected Discharge Plan and Services In-house Referral: NA Discharge Planning Services: CM Consult Post Acute Care Choice: Home Health Living arrangements for the past 2 months: Single Family Home                   DME Agency: NA                   Social Determinants of Health (SDOH) Interventions SDOH Screenings   Food Insecurity: No Food Insecurity (06/03/2023)  Housing: Low Risk  (06/03/2023)  Transportation Needs: No Transportation Needs (06/03/2023)  Utilities:  Not At Risk (06/03/2023)  Tobacco Use: High Risk (06/09/2023)    Readmission Risk Interventions     No data to display

## 2023-06-13 NOTE — Progress Notes (Signed)
PHARMACY NOTE:  ANTIMICROBIAL RENAL DOSAGE ADJUSTMENT  Current antimicrobial regimen includes a mismatch between antimicrobial dosage and estimated renal function.  As per policy approved by the Pharmacy & Therapeutics and Medical Executive Committees, the antimicrobial dosage will be adjusted accordingly.  Current antimicrobial dosage:  Levofloxacin 500mg  IV q48h  Indication: COPD treatment  Renal Function:  Estimated Creatinine Clearance: 37.3 mL/min (by C-G formula based on SCr of 0.96 mg/dL). []      On intermittent HD, scheduled: []      On CRRT    Antimicrobial dosage has been changed to:  250mg  IV q24h  Additional comments:   Thank you for allowing pharmacy to be a part of this patient's care.  Loralee Pacas, PharmD, BCPS 06/13/2023 12:44 PM

## 2023-06-13 NOTE — Progress Notes (Signed)
Occupational Therapy Treatment Patient Details Name: [name] MRN: [id number] DOB: May 17, 1941 Today's Date: 06/13/2023   History of present illness Pt is a 82 y/o female presenting on 8/9 with abdominal pain and HTN. Admitted with HTN emergency. S/P TEE 8/16. Blood cultures with bacteremia, but etiology unknown.  PICC line placed 8/17. PMH includes: anxiety, arthritis, CAD, skin CA, cataracts, CHF, COPD, generalized convulsive epilepsy, osteoporosis, seizures.   OT comments  Pt with slow progression towards goals this session, states she feels very tired today. Pt needing min A for bed mobility and CGA for x4 lateral scoots on EOB. Pt able to sit EOB for grooming and UB bathing task, VSS throughout on 6L O2. Pt able to demonstrate use of flutter valve x3. Pt presenting with impairments listed below, will follow acutely. Continue to recommend HHOT at d/c pending progression.      If plan is discharge home, recommend the following:  A lot of help with bathing/dressing/bathroom;A lot of help with walking and/or transfers;Direct supervision/assist for medications management;Assistance with cooking/housework;Direct supervision/assist for financial management;Assist for transportation;Help with stairs or ramp for entrance   Equipment Recommendations  None recommended by OT (pt has all needed DME)    Recommendations for Other Services      Precautions / Restrictions Precautions Precautions: Fall;Other (comment) Precaution Comments: watch O2 Restrictions Weight Bearing Restrictions: No       Mobility Bed Mobility Overal bed mobility: Needs Assistance Bed Mobility: Supine to Sit, Sit to Supine     Supine to sit: Min assist Sit to supine: Min assist   General bed mobility comments: increased time, assist for trunk minimally    Transfers Overall transfer level: Needs assistance                 General transfer comment: lateral scoot performed with CGA, able to scoot up  in bed x4     Balance Overall balance assessment: Needs assistance Sitting-balance support: No upper extremity supported, Feet supported Sitting balance-Leahy Scale: Fair                                     ADL either performed or assessed with clinical judgement   ADL Overall ADL's : Needs assistance/impaired     Grooming: Wash/dry face;Sitting   Upper Body Bathing: Moderate assistance Upper Body Bathing Details (indicate cue type and reason): to wash back                                Extremity/Trunk Assessment Upper Extremity Assessment Upper Extremity Assessment: Generalized weakness   Lower Extremity Assessment Lower Extremity Assessment: Defer to PT evaluation        Vision   Vision Assessment?: No apparent visual deficits   Perception Perception Perception: Not tested   Praxis Praxis Praxis: Not tested    Cognition Arousal: Lethargic Behavior During Therapy: WFL for tasks assessed/performed Overall Cognitive Status: Within Functional Limits for tasks assessed                                          Exercises      Shoulder Instructions       General Comments VSS on 6L O2    Pertinent Vitals/ Pain  Pain Assessment Pain Assessment: No/denies pain  Home Living                                          Prior Functioning/Environment              Frequency  Min 1X/week        Progress Toward Goals  OT Goals(current goals can now be found in the care plan section)  Progress towards OT goals: Progressing toward goals  Acute Rehab OT Goals Patient Stated Goal: to get better OT Goal Formulation: With patient Time For Goal Achievement: 06/25/23 Potential to Achieve Goals: Good ADL Goals Pt Will Perform Grooming: with modified independence;sitting;standing Pt Will Perform Upper Body Dressing: with set-up;sitting Pt Will Perform Lower Body Dressing: with min  assist;with adaptive equipment;sit to/from stand Pt Will Transfer to Toilet: with supervision;bedside commode Additional ADL Goal #1: Pt will demonstrate ability to utilize energy conservation techniques during daily routine to optimize independence and tolerance.  Plan      Co-evaluation                 AM-PAC OT "6 Clicks" Daily Activity     Outcome Measure   Help from another person eating meals?: A Little Help from another person taking care of personal grooming?: A Little Help from another person toileting, which includes using toliet, bedpan, or urinal?: Total Help from another person bathing (including washing, rinsing, drying)?: A Lot Help from another person to put on and taking off regular upper body clothing?: A Little Help from another person to put on and taking off regular lower body clothing?: Total 6 Click Score: 13    End of Session Equipment Utilized During Treatment: Oxygen  OT Visit Diagnosis: Other abnormalities of gait and mobility (R26.89);Muscle weakness (generalized) (M62.81);Other (comment)   Activity Tolerance Patient limited by fatigue   Patient Left in bed;with call bell/phone within reach;with bed alarm set   Nurse Communication Mobility status        Time: 3664-4034 OT Time Calculation (min): 17 min  Charges: OT General Charges $OT Visit: 1 Visit OT Treatments $Self Care/Home Management : 8-22 mins  Carver Fila, OTD, OTR/L SecureChat Preferred Acute Rehab [phone]   [name] 06/13/2023, 11:06 AM

## 2023-06-13 NOTE — Progress Notes (Signed)
PROGRESS NOTE    [name]  ZOX:[id number] DOB: 22-Sep-1941 DOA: 06/02/2023 PCP: Leane Call, PA-C    Brief Narrative:  82 years old female with past medical history of COPD on home oxygen at 2 L/min, coronary artery disease, carotid artery occlusion, congestive heart failure, hyperlipidemia, paroxysmal atrial tachycardia, history of seizure and chronic back pain and asthma presented to hospital with elevated blood pressure with systolic at 240 with leg edema.  Patient was then admitted hospital for further evaluation and treatment.   Assessment and Plan:  Leg swelling On Lasix.  Will continue intake and output charting Daily weights.  Anemia Iron profile done 10 days back showed low iron.  Folate level of 31.  Vitamin B12 elevated.  Continue iron sulfate.  Hypertensive emergency Currently on Coreg and Cardizem.  Hydralazine as needed.  Blood pressure has significantly improved.  COPD with acute exacerbation, acute on chronic hypoxic respiratory failure Received IV steroids.  Continue bronchodilators.  Concern for pneumonia so the patient is on ceftriaxone and azithromycin.  Allergic to Augmentin but  tolerated amoxicillin as well as cefdinir in the past.  Currently on 6 L of oxygen.  Patient is on 2 L of oxygen at baseline.  Will continue to wean as able.  Continue Singulair, on prednisone p.o.  Enterococcal bacteremia.  TEE without any infected leads.  CT of the abdomen pelvis normal.  ID on board and recommend 6 weeks of outpatient treatment with ampicillin.  PICC line placed 06/10/2023.  Chronic A-fib (HCC) Continue Eliquis.   DVT prophylaxis: apixaban (ELIQUIS) tablet 2.5 mg Start: 06/02/23 2300 SCDs Start: 06/02/23 2250 apixaban (ELIQUIS) tablet 2.5 mg   Code Status:     Code Status: Full Code  Disposition:   With home health likely 06/14/2023 if able to wean oxygen with IV antibiotics.  Will need more mobility.  Status is: Inpatient Remains inpatient  appropriate because: IV antibiotic, hypoxia,   Family Communication:  Spoke with the patient's spouse on the phone and updated her about the clinical condition of the patient.  Is concerned about her mobility and oxygen requirement.  Consultants:  Infectious disease PCCM  Procedures:  PICC line placement  Antimicrobials:  Ampicillin.  Anti-infectives (From admission, onward)    Start     Dose/Rate Route Frequency Ordered Stop   06/13/23 1400  Levofloxacin (LEVAQUIN) IVPB 250 mg        250 mg 50 mL/hr over 60 Minutes Intravenous Every 24 hours 06/13/23 1244     06/13/23 0000  ampicillin IVPB        12 g Intravenous Every 24 hours 06/13/23 0935     06/12/23 0900  azithromycin (ZITHROMAX) 250 mg in dextrose 5 % 125 mL IVPB  Status:  Discontinued        250 mg 127.5 mL/hr over 60 Minutes Intravenous Every 24 hours 06/11/23 1247 06/11/23 1248   06/11/23 1400  levofloxacin (LEVAQUIN) IVPB 500 mg  Status:  Discontinued        500 mg 100 mL/hr over 60 Minutes Intravenous Every 48 hours 06/11/23 1311 06/13/23 1244   06/11/23 1345  azithromycin (ZITHROMAX) 500 mg in sodium chloride 0.9 % 250 mL IVPB  Status:  Discontinued        500 mg 250 mL/hr over 60 Minutes Intravenous Every 24 hours 06/11/23 1246 06/11/23 1248   06/11/23 1345  levofloxacin (LEVAQUIN) IVPB 500 mg  Status:  Discontinued        500 mg 100 mL/hr over 60  Minutes Intravenous Every 24 hours 06/11/23 1255 06/11/23 1311   06/05/23 2200  cefTRIAXone (ROCEPHIN) 2 g in sodium chloride 0.9 % 100 mL IVPB  Status:  Discontinued        2 g 200 mL/hr over 30 Minutes Intravenous Every 12 hours 06/05/23 1507 06/09/23 1057   06/05/23 1800  vancomycin (VANCOCIN) IVPB 1000 mg/200 mL premix  Status:  Discontinued        1,000 mg 200 mL/hr over 60 Minutes Intravenous Every 48 hours 06/03/23 1918 06/05/23 1507   06/05/23 1800  ampicillin (OMNIPEN) 2 g in sodium chloride 0.9 % 100 mL IVPB        2 g 300 mL/hr over 20 Minutes Intravenous  Every 6 hours 06/05/23 1507     06/05/23 1030  amoxicillin (AMOXIL) capsule 500 mg        500 mg Oral  Once 06/05/23 0943 06/05/23 1013   06/03/23 1930  vancomycin (VANCOREADY) IVPB 1250 mg/250 mL        1,250 mg 166.7 mL/hr over 90 Minutes Intravenous  Once 06/03/23 1835 06/03/23 2212   06/02/23 2300  cefTRIAXone (ROCEPHIN) 1 g in sodium chloride 0.9 % 100 mL IVPB  Status:  Discontinued       Note to Pharmacy: Patient has tolerated amoxicillin in the past. She has taken cefdinir. Her reaction was only to augmentin (I believe from clavulante acid)   1 g 200 mL/hr over 30 Minutes Intravenous Every 24 hours 06/02/23 2257 06/03/23 1840   06/02/23 2300  azithromycin (ZITHROMAX) 500 mg in sodium chloride 0.9 % 250 mL IVPB  Status:  Discontinued        500 mg 250 mL/hr over 60 Minutes Intravenous Every 24 hours 06/02/23 2257 06/03/23 1840       Subjective:  Today, patient was seen and examined at bedside.  Patient states that she does not feel well and feels a weakness.  Overall breathing has improved.  Denies any pain, nausea, vomiting.  Objective: Vitals:   06/13/23 0523 06/13/23 0713 06/13/23 0812 06/13/23 1220  BP: (!) 141/85  (!) 147/88 (!) 143/75  Pulse:  80 78 79  Resp: 18 20 18 20   Temp: 97.7 F (36.5 C)  (!) 97.5 F (36.4 C) 97.8 F (36.6 C)  TempSrc: Oral  Axillary Axillary  SpO2: 97% 100% 99% 99%  Weight: 58.2 kg     Height:        Intake/Output Summary (Last 24 hours) at 06/13/2023 1328 Last data filed at 06/12/2023 2134 Gross per 24 hour  Intake --  Output 850 ml  Net -850 ml   Filed Weights   06/11/23 0300 06/12/23 0318 06/13/23 0523  Weight: 56.2 kg 57.4 kg 58.2 kg    Physical Examination: Body mass index is 25.06 kg/m.   General:  Average built, not in obvious distress, on nasal canula oxygen HENT:   No scleral pallor or icterus noted. Oral mucosa is moist.  Chest:  Diminished breath sounds bilaterally. No crackles or wheezes.  CVS: S1 &S2 heard. No  murmur.  Regular rate and rhythm. Abdomen: Soft, nontender, nondistended.  Bowel sounds are heard.   Extremities: No cyanosis, clubbing or edema.  Peripheral pulses are palpable. Psych: Alert, awake and oriented, normal mood CNS:  No cranial nerve deficits.  Power equal in all extremities.   Skin: Warm and dry.  No rashes noted.  Data Reviewed:   CBC: Recent Labs  Lab 06/09/23 1455 06/10/23 0239 06/11/23 0028 06/12/23 0036 06/12/23 6213  06/13/23 0500  WBC 14.3* 9.5 11.7*  --  9.2 12.5*  HGB 9.8* 9.4* 9.2* 8.9* 9.4* 9.4*  HCT 31.9* 30.2* 30.3* 29.8* 30.9* 30.6*  MCV 80.8 77.6* 80.4  --  79.2* 80.5  PLT 342 285 301  --  331 350    Basic Metabolic Panel: Recent Labs  Lab 06/10/23 0239 06/10/23 0645 06/11/23 0028 06/12/23 0140 06/13/23 0500  NA 135 136 138 133* 134*  K 2.9* 5.1 3.9 3.9 3.7  CL 89* 91* 89* 86* 85*  CO2 33* 33* 38* 37* 39*  GLUCOSE 154* 161* 168* 202* 166*  BUN 27* 28* 32* 28* 33*  CREATININE 0.97 1.04* 1.24* 1.21* 0.96  CALCIUM 7.4* 7.5* 7.6* 7.7* 8.4*  MG 2.2 2.1 2.2 2.1 2.3    Liver Function Tests: Recent Labs  Lab 06/11/23 0028 06/12/23 0140 06/13/23 0500  AST 26 28 23   ALT 71* 68* 56*  ALKPHOS 52 50 45  BILITOT 0.6 0.5 0.8  PROT 5.5* 5.3* 5.3*  ALBUMIN 3.0* 2.8* 2.8*     Radiology Studies: CT CHEST WO CONTRAST  Result Date: 06/12/2023 CLINICAL DATA:  Shortness of breath, pneumonia suspected EXAM: CT CHEST WITHOUT CONTRAST TECHNIQUE: Multidetector CT imaging of the chest was performed following the standard protocol without IV contrast. RADIATION DOSE REDUCTION: This exam was performed according to the departmental dose-optimization program which includes automated exposure control, adjustment of the mA and/or kV according to patient size and/or use of iterative reconstruction technique. COMPARISON:  Chest x-ray dated June 11, 2023; CT abdomen and pelvis dated June 09, 2023 FINDINGS: Cardiovascular: Cardiomegaly. No pericardial  effusion. Normal caliber thoracic aorta with moderate calcified plaque. Severe calcifications of the partially visualized abdominal aorta causing intraluminal narrowing. Mediastinum/Nodes: Esophagus and thyroid are unremarkable. No pathologically enlarged lymph nodes seen in the chest. Lungs/Pleura: Central airways are patent. Severe centrilobular emphysema. Complete left lower lobe collapse. Small left-greater-than-right pleural effusions. Mild consolidation of the posterior right upper lobe. Irregular solid nodule of the left upper lobe measuring 6 mm on series 4, image 61. No evidence of pneumothorax. Upper Abdomen: Partially visualized cystic left renal lesions, better visualized on recent CT of the abdomen and pelvis. No acute abnormality. Musculoskeletal: Severe age-indeterminate compression deformity of T8. Moderate age indeterminate compression deformities of T9. Additional mild-to-moderate compression deformities of the lower thoracic/upper lumbar spine are unchanged when compared with prior CT of the abdomen and pelvis. No aggressive appearing osseous lesions. IMPRESSION: 1. Complete left lower lobe collapse and small left-greater-than-right pleural effusions. 2. Mild consolidation of the posterior right upper lobe and irregular solid nodule of the left upper lobe measuring 6 mm, likely infectious. Recommend follow-up chest CT in 3 months to ensure resolution. 3. Severe age-indeterminate compression deformity of T8 and moderate age indeterminate compression deformity of T9. Correlate for point tenderness. 4. Aortic Atherosclerosis (ICD10-I70.0) and Emphysema (ICD10-J43.9). Electronically Signed   By: Allegra Lai M.D.   On: 06/12/2023 14:42      LOS: 11 days   Joycelyn Das, MD [facility] Available via Epic secure chat 7am-7pm After these hours, please refer to coverage provider listed on [email or web address] 06/13/2023, 1:28 PM

## 2023-06-13 NOTE — Care Management Important Message (Signed)
Important Message  Patient Details  Name: [name] MRN: [id number] Date of Birth: 1941-04-10   Medicare Important Message Given:  Yes     Renie Ora 06/13/2023, 11:19 AM

## 2023-06-14 ENCOUNTER — Inpatient Hospital Stay (HOSPITAL_COMMUNITY): Payer: No Typology Code available for payment source

## 2023-06-14 DIAGNOSIS — L989 Disorder of the skin and subcutaneous tissue, unspecified: Secondary | ICD-10-CM | POA: Diagnosis not present

## 2023-06-14 DIAGNOSIS — I480 Paroxysmal atrial fibrillation: Secondary | ICD-10-CM | POA: Diagnosis not present

## 2023-06-14 DIAGNOSIS — R7881 Bacteremia: Secondary | ICD-10-CM | POA: Diagnosis not present

## 2023-06-14 DIAGNOSIS — J441 Chronic obstructive pulmonary disease with (acute) exacerbation: Secondary | ICD-10-CM | POA: Diagnosis not present

## 2023-06-14 LAB — URINALYSIS, ROUTINE W REFLEX MICROSCOPIC
Bacteria, UA: NONE SEEN
Bilirubin Urine: NEGATIVE
Glucose, UA: NEGATIVE mg/dL
Ketones, ur: NEGATIVE mg/dL
Leukocytes,Ua: NEGATIVE
Nitrite: NEGATIVE
Protein, ur: NEGATIVE mg/dL
Specific Gravity, Urine: 1.013 (ref 1.005–1.030)
pH: 6 (ref 5.0–8.0)

## 2023-06-14 LAB — BASIC METABOLIC PANEL
Anion gap: 10 (ref 5–15)
BUN: 32 mg/dL — ABNORMAL HIGH (ref 8–23)
CO2: 38 mmol/L — ABNORMAL HIGH (ref 22–32)
Calcium: 8.2 mg/dL — ABNORMAL LOW (ref 8.9–10.3)
Chloride: 83 mmol/L — ABNORMAL LOW (ref 98–111)
Creatinine, Ser: 0.91 mg/dL (ref 0.44–1.00)
GFR, Estimated: >60 mL/min (ref >60)
Glucose, Bld: 174 mg/dL — ABNORMAL HIGH (ref 70–99)
Potassium: 3.2 mmol/L — ABNORMAL LOW (ref 3.5–5.1)
Sodium: 131 mmol/L — ABNORMAL LOW (ref 135–145)

## 2023-06-14 LAB — CBC
HCT: 31.2 % — ABNORMAL LOW (ref 36.0–46.0)
Hemoglobin: 9.6 g/dL — ABNORMAL LOW (ref 12.0–15.0)
MCH: 23.9 pg — ABNORMAL LOW (ref 26.0–34.0)
MCHC: 30.8 g/dL (ref 30.0–36.0)
MCV: 77.8 fL — ABNORMAL LOW (ref 80.0–100.0)
Platelets: 373 10*3/uL (ref 150–400)
RBC: 4.01 MIL/uL (ref 3.87–5.11)
RDW: 22.4 % — ABNORMAL HIGH (ref 11.5–15.5)
WBC: 13.8 10*3/uL — ABNORMAL HIGH (ref 4.0–10.5)
nRBC: 0.2 % (ref 0.0–0.2)

## 2023-06-14 LAB — MAGNESIUM: Magnesium: 2.1 mg/dL (ref 1.7–2.4)

## 2023-06-14 MED ORDER — BISACODYL 10 MG RE SUPP
10.0000 mg | Freq: Once | RECTAL | Status: DC
  Filled 2023-06-14: qty 1

## 2023-06-14 MED ORDER — LEVALBUTEROL HCL 0.63 MG/3ML IN NEBU
INHALATION_SOLUTION | RESPIRATORY_TRACT | Status: AC
  Filled 2023-06-14: qty 3

## 2023-06-14 MED ORDER — POTASSIUM CHLORIDE 10 MEQ/100ML IV SOLN
10.0000 meq | INTRAVENOUS | Status: AC
  Administered 2023-06-14: 10 meq via INTRAVENOUS
  Filled 2023-06-14: qty 100

## 2023-06-14 MED ORDER — FLAVOXATE HCL 100 MG PO TABS
100.0000 mg | ORAL_TABLET | Freq: Three times a day (TID) | ORAL | Status: DC | PRN
  Administered 2023-06-14 – 2023-06-16 (×3): 100 mg via ORAL
  Filled 2023-06-14 (×4): qty 1

## 2023-06-14 MED ORDER — POTASSIUM CHLORIDE 10 MEQ/100ML IV SOLN
10.0000 meq | INTRAVENOUS | Status: DC
  Administered 2023-06-14 (×3): 10 meq via INTRAVENOUS
  Filled 2023-06-14 (×3): qty 100

## 2023-06-14 NOTE — Progress Notes (Signed)
Patient was on 4L HFNC upon shift start. She began to get sob, dyspneic and sats dropped to the 70s. Called respiratory and they came and placed her on the venturi mask at 10L. Patient gradually increased to 98-100% oxygen. The husband came out and asked [place] to take the oxygen down because "that much oxygen will kill her". Patient did not want the venturi mask so I took it off, placed her back on HFNC and tried to turn down to 8 but patient desat to the 70s again. Patient refuses a mask of any kind due to extreme anxiety. Notified MD and got an order for ativan. Respiratory came back and placed patient on heated HFNC @ 30L/40%. Patient's oxygen went up to 95-98%. After taking ativan, patient is now calm and resting.

## 2023-06-14 NOTE — Plan of Care (Signed)
  Problem: Clinical Measurements: Goal: Cardiovascular complication will be avoided Outcome: Progressing   Problem: Pain Managment: Goal: General experience of comfort will improve Outcome: Progressing   Problem: Skin Integrity: Goal: Risk for impaired skin integrity will decrease Outcome: Progressing   

## 2023-06-14 NOTE — Progress Notes (Signed)
Went into patient's room after she desaturated into the 80s. Walked into room and found that her HFNC had been pulled off. Patient asked staff "why are you all letting me die?. Explained to patient that our goal by applying all of the interventions we have throughout the night are attempts to avoid death. Patient stated that she did not trust [place]. Was able to secure HFNC back onto patient and oxygen saturations went back up into the 90s. Rounded on patient to give meds and she stated "I don't want to die like this". Patient obviously has fears about dying. She expressed that she did believe in God. I asked patient if I could pray with her and she permitted me to. Prayed with patient and asked her if there was anything that I could do for her but she denied additional needs. Not currently in respiratory distress. Unable to tolerate not having oxygen on or activity currently.

## 2023-06-14 NOTE — Progress Notes (Addendum)
PROGRESS NOTE    [name]  BJY:[id number] DOB: 1941-08-14 DOA: 06/02/2023 PCP: Leane Call, PA-C    Brief Narrative:   82 years old female with past medical history of COPD on home oxygen at 2 L/min, coronary artery disease, carotid artery occlusion, congestive heart failure, hyperlipidemia, paroxysmal atrial tachycardia, history of seizure and chronic back pain and asthma presented to hospital with elevated blood pressure with systolic at 240 with leg edema.  Patient was then admitted hospital for further evaluation and treatment.   Assessment and Plan:  Leg swelling On Lasix.  Will continue intake and output charting Daily weights.  Negative balance for 8140 mL.  Anemia Iron profile done 10 days back showed low iron.  Folate level of 31.  Vitamin B12 elevated.  Continue iron sulfate.  Hypertensive emergency Currently on Coreg and Cardizem.  Hydralazine as needed.  Blood pressure has significantly improved.  COPD with acute exacerbation,  acute on chronic hypoxic respiratory failure Received IV steroids.  Continue bronchodilators.  Concern for pneumonia so the patient is on ceftriaxone and azithromycin.  Allergic to Augmentin but  tolerated amoxicillin as well as cefdinir in the past.  Currently on 30 L of oxygen per minute.  Patient is on 2 L of oxygen at baseline.    Continue Singulair, on prednisone p.o. patient states that she does not wish to continue any aggressive treatment at this time and wishes to go home.  Enterococcal bacteremia.  TEE without any infected leads.  CT of the abdomen pelvis normal.  ID on board and recommend 6 weeks of outpatient treatment with ampicillin.  PICC line placed 06/10/2023.  Chronic A-fib (HCC) Continue Eliquis.  Hypokalemia.  Will replenish.  Check levels in AM.  Goals of care.  Had a prolonged discussion with the patient's husband and the patient herself at bedside.  Patient clearly expresses that if she does not wish to go  through aggressive treatment and wants to go home and peacefully passed away if that were to happen.  She is open to the idea of palliative care to set up/hospice set up at home.  Will consult TOC for hospice consult and palliative care for ongoing addressing patient's need/family support.  Overall condition of the patient is poor due to increasing oxygen needs, debility deconditioning and frailty.   DVT prophylaxis: apixaban (ELIQUIS) tablet 2.5 mg Start: 06/02/23 2300 SCDs Start: 06/02/23 2250 apixaban (ELIQUIS) tablet 2.5 mg   Code Status:     Code Status: DNR had a prolonged discussion with patient's husband and patient at bedside.  Disposition:   Likely disposition in 1 to 2 days with home health/home hospice.  Will consult TOC/palliative care team.  Status is: Inpatient Remains inpatient appropriate because: IV antibiotic, hypoxia,   Family Communication:   Spoke with the patient's spouse on the phone and updated him about the clinical condition of the patient.  I also updated the patient's son on the phone.   Consultants:  Infectious disease PCCM Palliative care.  Procedures:  PICC line placement  Antimicrobials:  Ampicillin 8/20>   Subjective:  Today, patient was seen and examined at bedside.  Overnight patient had the increased oxygen demand.  Patient denies any chest pain, fever chills but does not wish to proceed with aggressive care.  Wants to go home today.  Had a prolonged discussion with the patient's husband at bedside as well.    Objective: Vitals:   06/14/23 0848 06/14/23 0859 06/14/23 1138 06/14/23 1222  BP: Marland Kitchen)  178/97 (!) 181/94 (!) 150/94   Pulse: 80 82 81   Resp: 20 (!) 23 20   Temp: 97.7 F (36.5 C)  97.6 F (36.4 C)   TempSrc: Oral  Oral   SpO2: 96%  90% 92%  Weight:      Height:        Intake/Output Summary (Last 24 hours) at 06/14/2023 1320 Last data filed at 06/14/2023 1020 Gross per 24 hour  Intake 480 ml  Output 900 ml  Net -420 ml    Filed Weights   06/12/23 0318 06/13/23 0523 06/14/23 0425  Weight: 57.4 kg 58.2 kg 59.9 kg    Physical Examination: Body mass index is 25.79 kg/m.   General:  Average built, not in obvious distress, on high flow nasal cannula oxygen HENT:   No scleral pallor or icterus noted. Oral mucosa is moist.  Chest:  Diminished breath sounds bilaterally. CVS: S1 &S2 heard. No murmur.  Regular rate and rhythm. Abdomen: Soft, nontender, nondistended.  Bowel sounds are heard.   Extremities: No cyanosis, clubbing or edema.  Peripheral pulses are palpable. Psych: Alert, awake and oriented, normal mood CNS:  No cranial nerve deficits.  Power equal in all extremities.   Skin: Warm and dry.  No rashes noted.  Data Reviewed:   CBC: Recent Labs  Lab 06/10/23 0239 06/11/23 0028 06/12/23 0036 06/12/23 0922 06/13/23 0500 06/14/23 0539  WBC 9.5 11.7*  --  9.2 12.5* 13.8*  HGB 9.4* 9.2* 8.9* 9.4* 9.4* 9.6*  HCT 30.2* 30.3* 29.8* 30.9* 30.6* 31.2*  MCV 77.6* 80.4  --  79.2* 80.5 77.8*  PLT 285 301  --  331 350 373    Basic Metabolic Panel: Recent Labs  Lab 06/10/23 0645 06/11/23 0028 06/12/23 0140 06/13/23 0500 06/14/23 0539  NA 136 138 133* 134* 131*  K 5.1 3.9 3.9 3.7 3.2*  CL 91* 89* 86* 85* 83*  CO2 33* 38* 37* 39* 38*  GLUCOSE 161* 168* 202* 166* 174*  BUN 28* 32* 28* 33* 32*  CREATININE 1.04* 1.24* 1.21* 0.96 0.91  CALCIUM 7.5* 7.6* 7.7* 8.4* 8.2*  MG 2.1 2.2 2.1 2.3 2.1    Liver Function Tests: Recent Labs  Lab 06/11/23 0028 06/12/23 0140 06/13/23 0500  AST 26 28 23   ALT 71* 68* 56*  ALKPHOS 52 50 45  BILITOT 0.6 0.5 0.8  PROT 5.5* 5.3* 5.3*  ALBUMIN 3.0* 2.8* 2.8*     Radiology Studies: DG CHEST PORT 1 VIEW  Result Date: 06/14/2023 CLINICAL DATA:  Hypoxia, abdominal pain, hypertension EXAM: PORTABLE CHEST 1 VIEW COMPARISON:  Chest x-ray dated June 11, 2023 FINDINGS: Cardiac and mediastinal contours are within normal limits. Unchanged position of right arm  PICC and left chest wall pacer. Stable small left pleural effusion. Mild diffuse interstitial opacities, unchanged. Skinfold overlying the lateral left hemithorax. IMPRESSION: 1. Stable small left pleural effusion. 2. Stable mild diffuse interstitial opacities, likely due to pulmonary edema. Electronically Signed   By: Allegra Lai M.D.   On: 06/14/2023 10:18      LOS: 12 days   Joycelyn Das, MD [facility] Available via Epic secure chat 7am-7pm After these hours, please refer to coverage provider listed on [email or web address] 06/14/2023, 1:20 PM

## 2023-06-14 NOTE — Progress Notes (Signed)
Patient stating having dysuria and abdominal discomfort. Patient stating she need to have a bowel moment. Patient was able to have small bowel movement but dysuria persisted. Paged Attending. Awaiting for orders. Will continue to monitor patient.

## 2023-06-15 DIAGNOSIS — J441 Chronic obstructive pulmonary disease with (acute) exacerbation: Secondary | ICD-10-CM | POA: Diagnosis not present

## 2023-06-15 DIAGNOSIS — L989 Disorder of the skin and subcutaneous tissue, unspecified: Secondary | ICD-10-CM | POA: Diagnosis not present

## 2023-06-15 DIAGNOSIS — I480 Paroxysmal atrial fibrillation: Secondary | ICD-10-CM | POA: Diagnosis not present

## 2023-06-15 DIAGNOSIS — R7881 Bacteremia: Secondary | ICD-10-CM | POA: Diagnosis not present

## 2023-06-15 LAB — CBC
HCT: 26.9 % — ABNORMAL LOW (ref 36.0–46.0)
Hemoglobin: 8.6 g/dL — ABNORMAL LOW (ref 12.0–15.0)
MCH: 24.4 pg — ABNORMAL LOW (ref 26.0–34.0)
MCHC: 32 g/dL (ref 30.0–36.0)
MCV: 76.4 fL — ABNORMAL LOW (ref 80.0–100.0)
Platelets: 327 10*3/uL (ref 150–400)
RBC: 3.52 MIL/uL — ABNORMAL LOW (ref 3.87–5.11)
RDW: 22 % — ABNORMAL HIGH (ref 11.5–15.5)
WBC: 11.8 10*3/uL — ABNORMAL HIGH (ref 4.0–10.5)
nRBC: 0.3 % — ABNORMAL HIGH (ref 0.0–0.2)

## 2023-06-15 LAB — BASIC METABOLIC PANEL
Anion gap: 11 (ref 5–15)
BUN: 32 mg/dL — ABNORMAL HIGH (ref 8–23)
CO2: 35 mmol/L — ABNORMAL HIGH (ref 22–32)
Calcium: 7.6 mg/dL — ABNORMAL LOW (ref 8.9–10.3)
Chloride: 80 mmol/L — ABNORMAL LOW (ref 98–111)
Creatinine, Ser: 0.87 mg/dL (ref 0.44–1.00)
GFR, Estimated: >60 mL/min (ref >60)
Glucose, Bld: 189 mg/dL — ABNORMAL HIGH (ref 70–99)
Potassium: 3.5 mmol/L (ref 3.5–5.1)
Sodium: 126 mmol/L — ABNORMAL LOW (ref 135–145)

## 2023-06-15 LAB — MAGNESIUM: Magnesium: 2 mg/dL (ref 1.7–2.4)

## 2023-06-15 MED ORDER — DOCUSATE SODIUM 100 MG PO CAPS
100.0000 mg | ORAL_CAPSULE | Freq: Two times a day (BID) | ORAL | Status: DC
  Administered 2023-06-15 – 2023-06-16 (×4): 100 mg via ORAL
  Filled 2023-06-15 (×5): qty 1

## 2023-06-15 MED ORDER — FUROSEMIDE 10 MG/ML IJ SOLN
40.0000 mg | Freq: Once | INTRAMUSCULAR | Status: AC
  Administered 2023-06-15: 40 mg via INTRAVENOUS
  Filled 2023-06-15: qty 4

## 2023-06-15 MED ORDER — LEVOFLOXACIN IN D5W 250 MG/50ML IV SOLN
250.0000 mg | INTRAVENOUS | Status: AC
  Administered 2023-06-15: 250 mg via INTRAVENOUS
  Filled 2023-06-15: qty 50

## 2023-06-15 MED ORDER — OXYCODONE-ACETAMINOPHEN 5-325 MG PO TABS
1.0000 | ORAL_TABLET | Freq: Four times a day (QID) | ORAL | Status: DC | PRN
  Administered 2023-06-15: 1 via ORAL
  Filled 2023-06-15: qty 2
  Filled 2023-06-15 (×2): qty 1

## 2023-06-15 MED ORDER — LEVALBUTEROL HCL 0.63 MG/3ML IN NEBU
INHALATION_SOLUTION | RESPIRATORY_TRACT | Status: AC
  Administered 2023-06-15: 0.63 mg
  Filled 2023-06-15: qty 3

## 2023-06-15 MED ORDER — POTASSIUM CHLORIDE CRYS ER 20 MEQ PO TBCR
40.0000 meq | EXTENDED_RELEASE_TABLET | Freq: Once | ORAL | Status: AC
  Administered 2023-06-15: 40 meq via ORAL
  Filled 2023-06-15: qty 2

## 2023-06-15 NOTE — Plan of Care (Signed)
  Problem: Education: Goal: Knowledge of General Education information will improve Description: Including pain rating scale, medication(s)/side effects and non-pharmacologic comfort measures Outcome: Progressing   Problem: Clinical Measurements: Goal: Ability to maintain clinical measurements within normal limits will improve Outcome: Progressing Goal: Will remain free from infection Outcome: Progressing Goal: Cardiovascular complication will be avoided Outcome: Progressing   Problem: Nutrition: Goal: Adequate nutrition will be maintained Outcome: Progressing   Problem: Coping: Goal: Level of anxiety will decrease Outcome: Progressing   Problem: Elimination: Goal: Will not experience complications related to bowel motility Outcome: Progressing Goal: Will not experience complications related to urinary retention Outcome: Progressing   Problem: Pain Managment: Goal: General experience of comfort will improve Outcome: Progressing   Problem: Safety: Goal: Ability to remain free from injury will improve Outcome: Progressing   Problem: Skin Integrity: Goal: Risk for impaired skin integrity will decrease Outcome: Progressing

## 2023-06-15 NOTE — Progress Notes (Signed)
Occupational Therapy Treatment Patient Details Name: [name] MRN: [id number] DOB: 1941/06/30 Today's Date: 06/15/2023   History of present illness Pt is a 82 y/o female presenting on 8/9 with abdominal pain and HTN. Admitted with HTN emergency. S/P TEE 8/16. Blood cultures with bacteremia, but etiology unknown.  PICC line placed 8/17. PMH includes: anxiety, arthritis, CAD, skin CA, cataracts, CHF, COPD, generalized convulsive epilepsy, osteoporosis, seizures.   OT comments  Patient in a good mood this date, lying in bed with spouse at bedside.  Patient agreeable to transition to recliner to eat breakfast.  Mod A for step pivot.  Patient and spouse will be meeting with Palliative and there is mention of home with possibly hospice.  OT can continue efforts depending on goals of care.  HH has been recommended, but that will depend on discharge disposition.        If plan is discharge home, recommend the following:  A lot of help with bathing/dressing/bathroom;A lot of help with walking and/or transfers;Direct supervision/assist for medications management;Assistance with cooking/housework;Direct supervision/assist for financial management;Assist for transportation;Help with stairs or ramp for entrance   Equipment Recommendations  None recommended by OT    Recommendations for Other Services      Precautions / Restrictions Precautions Precautions: Fall;Other (comment) Precaution Comments: watch O2 Restrictions Weight Bearing Restrictions: No       Mobility Bed Mobility Overal bed mobility: Needs Assistance Bed Mobility: Supine to Sit     Supine to sit: Min assist, HOB elevated       Patient Response: Cooperative  Transfers Overall transfer level: Needs assistance Equipment used: 1 person hand held assist Transfers: Sit to/from Stand, Bed to chair/wheelchair/BSC Sit to Stand: Min assist     Step pivot transfers: Mod assist           Balance Overall balance  assessment: Needs assistance Sitting-balance support: No upper extremity supported, Feet supported Sitting balance-Leahy Scale: Fair     Standing balance support: Bilateral upper extremity supported Standing balance-Leahy Scale: Poor                             ADL either performed or assessed with clinical judgement   ADL       Grooming: Wash/dry face;Sitting               Lower Body Dressing: Sitting/lateral leans;Maximal assistance               Functional mobility during ADLs: Moderate assistance      Extremity/Trunk Assessment Upper Extremity Assessment Upper Extremity Assessment: Generalized weakness   Lower Extremity Assessment Lower Extremity Assessment: Defer to PT evaluation   Cervical / Trunk Assessment Cervical / Trunk Assessment: Kyphotic    Vision Patient Visual Report: No change from baseline     Perception Perception Perception: Not tested   Praxis Praxis Praxis: Not tested    Cognition Arousal: Alert Behavior During Therapy: [facility] for tasks assessed/performed Overall Cognitive Status: Within Functional Limits for tasks assessed                                          Exercises      Shoulder Instructions       General Comments      Pertinent Vitals/ Pain       Pain Assessment Pain Assessment: No/denies pain  Frequency  Min 1X/week        Progress Toward Goals  OT Goals(current goals can now be found in the care plan section)  Progress towards OT goals: Progressing toward goals  Acute Rehab OT Goals OT Goal Formulation: With patient Time For Goal Achievement: 06/25/23 Potential to Achieve Goals: Good  Plan      Co-evaluation                 AM-PAC OT "6 Clicks" Daily Activity     Outcome Measure   Help from another person eating meals?: A Little Help from another person taking care of  personal grooming?: A Little Help from another person toileting, which includes using toliet, bedpan, or urinal?: A Lot Help from another person bathing (including washing, rinsing, drying)?: A Lot Help from another person to put on and taking off regular upper body clothing?: A Little Help from another person to put on and taking off regular lower body clothing?: A Lot 6 Click Score: 15    End of Session Equipment Utilized During Treatment: Oxygen  OT Visit Diagnosis: Other abnormalities of gait and mobility (R26.89);Muscle weakness (generalized) (M62.81);Other (comment)   Activity Tolerance Patient limited by fatigue   Patient Left in chair;with call bell/phone within reach;with family/visitor present   Nurse Communication Mobility status        Time: 1610-9604 OT Time Calculation (min): 21 min  Charges: OT General Charges $OT Visit: 1 Visit OT Treatments $Self Care/Home Management : 8-22 mins  06/15/2023  RP, OTR/L  Acute Rehabilitation Services  Office:  [phone]   [name] 06/15/2023, 10:34 AM

## 2023-06-15 NOTE — Progress Notes (Signed)
Physical Therapy Treatment Patient Details Name: [name] MRN: [id number] DOB: 1941-09-06 Today's Date: 06/15/2023   History of Present Illness Pt is a 82 y/o female presenting on 8/9 with abdominal pain and HTN. Admitted with HTN emergency. S/P TEE 8/16. Blood cultures with bacteremia, but etiology unknown.  PICC line placed 8/17. PMH includes: anxiety, arthritis, CAD, skin CA, cataracts, CHF, COPD, generalized convulsive epilepsy, osteoporosis, seizures.    PT Comments  Pt up in chair, requesting back to bed upon PT arrival to room. Pt with increased O2 needs today, on HHFNC 25L/min and 28%FiO2. Pt tolerated x2 stands and x1 pivot from recliner to bed with mod +2 assist. Pt struggling with power up from support surfaces, requiring momentum-building rocking and multiple attempts before successfully standing. Pt's daughter and husband present, supportive. Pt will need at least moderate physical assist at home once d/c for transfer-level mobility, per chart and pt/family plan is d/c home.      If plan is discharge home, recommend the following: A lot of help with walking and/or transfers;A lot of help with bathing/dressing/bathroom   Can travel by private vehicle        Equipment Recommendations  Rollator (4 wheels);Other (comment)    Recommendations for Other Services       Precautions / Restrictions Precautions Precautions: Fall Precaution Comments: watch O2 Restrictions Weight Bearing Restrictions: No     Mobility  Bed Mobility Overal bed mobility: Needs Assistance Bed Mobility: Sit to Supine Rolling: Used rails, Mod assist, +2 for physical assistance         General bed mobility comments: up in chair upon PT arrival to room. assist for LE lift, trunk lower, boost up in bed with bed pad and trendelenburg    Transfers Overall transfer level: Needs assistance Equipment used: 2 person hand held assist Transfers: Sit to/from Stand, Bed to chair/wheelchair/BSC Sit  to Stand: Mod assist, +2 physical assistance   Step pivot transfers: Mod assist, +2 safety/equipment       General transfer comment: mod +2 for power up, rise, steadying, and pivot to bed. stand x2, from recliner and EOB. x2 stand attempts before successful    Ambulation/Gait               General Gait Details: pivotal steps only   Stairs             Wheelchair Mobility     Tilt Bed    Modified Rankin (Stroke Patients Only)       Balance Overall balance assessment: Needs assistance Sitting-balance support: No upper extremity supported, Feet supported Sitting balance-Leahy Scale: Fair     Standing balance support: Bilateral upper extremity supported Standing balance-Leahy Scale: Poor Standing balance comment: reliant on external assist                            Cognition Arousal: Alert Behavior During Therapy: WFL for tasks assessed/performed Overall Cognitive Status: Within Functional Limits for tasks assessed                                          Exercises General Exercises - Lower Extremity Ankle Circles/Pumps: AROM, Both, 20 reps, Supine Heel Slides: AAROM, Both, Supine, 5 reps Hip ABduction/ADduction: AAROM, Both, Supine, 5 reps    General Comments General comments (skin integrity, edema, etc.): SpO2 86-94% on 25L,  28%FiO2 via HHFNC      Pertinent Vitals/Pain Pain Assessment Pain Assessment: No/denies pain    Home Living                          Prior Function            PT Goals (current goals can now be found in the care plan section) Acute Rehab PT Goals Patient Stated Goal: to rest PT Goal Formulation: With patient Time For Goal Achievement: 06/26/23 Potential to Achieve Goals: Good Progress towards PT goals: Progressing toward goals    Frequency    Min 1X/week      PT Plan      Co-evaluation              AM-PAC PT "6 Clicks" Mobility   Outcome Measure  Help  needed turning from your back to your side while in a flat bed without using bedrails?: A Little Help needed moving from lying on your back to sitting on the side of a flat bed without using bedrails?: A Lot Help needed moving to and from a bed to a chair (including a wheelchair)?: A Lot Help needed standing up from a chair using your arms (e.g., wheelchair or bedside chair)?: A Lot Help needed to walk in hospital room?: Total Help needed climbing 3-5 steps with a railing? : Total 6 Click Score: 11    End of Session Equipment Utilized During Treatment: Oxygen Activity Tolerance: Patient limited by fatigue Patient left: in bed;with call bell/phone within reach;with bed alarm set;with family/visitor present Nurse Communication: Mobility status PT Visit Diagnosis: Other abnormalities of gait and mobility (R26.89);Muscle weakness (generalized) (M62.81);Other (comment)     Time: 5284-1324 PT Time Calculation (min) (ACUTE ONLY): 30 min  Charges:    $Therapeutic Exercise: 8-22 mins $Therapeutic Activity: 8-22 mins PT General Charges $$ ACUTE PT VISIT: 1 Visit                     Marye Round, PT DPT Acute Rehabilitation Services Secure Chat Preferred  Office [phone]    Lucifer Soja Sheliah Plane 06/15/2023, 1:47 PM

## 2023-06-15 NOTE — Progress Notes (Addendum)
PROGRESS NOTE    [name]  WUJ:[id number] DOB: 23-Sep-1941 DOA: 06/02/2023 PCP: Leane Call, PA-C    Brief Narrative:   82 years old female with past medical history of COPD on home oxygen at 2 L/min, coronary artery disease, carotid artery occlusion, congestive heart failure, hyperlipidemia, paroxysmal atrial tachycardia, history of seizure and chronic back pain and asthma presented to hospital with elevated blood pressure with systolic at 240 with leg edema.  Patient was then admitted hospital for further evaluation and treatment.   Assessment and Plan:  COPD with acute exacerbation,  acute on chronic hypoxic respiratory failure Received IV steroids.  Continue bronchodilators.  Concern for pneumonia so the patient is on ceftriaxone and azithromycin.  Allergic to Augmentin but  tolerated amoxicillin as well as cefdinir in the past.  Currently on 30 L of oxygen per high flow nasal cannula/minute.  Patient is on 2 L of oxygen at baseline.    Continue Singulair, on prednisone p.o. Palliative care Johny Shock has been consulted at this time.  Leg swelling On Lasix.  Will continue intake and output charting Daily weights.  Negative balance for .  Patient has been believes that she is likely retaining fluids and edematous.  Will give 1 dose of IV Lasix today.  Reassess daily.  Anemia Iron profile showed low iron.  Folate level of 31.  Vitamin B12 elevated.  Continue iron sulfate.  Hypertensive emergency Currently on Coreg and Cardizem.  Hydralazine as needed.  Blood pressure has  improved.  Enterococcal bacteremia.   TEE without any infected leads.  CT of the abdomen pelvis normal.  ID on board and recommend 6 weeks of outpatient treatment with ampicillin.  PICC line placed 06/10/2023.  Chronic A-fib (HCC) Continue Eliquis.  Hypokalemia.  Borderline low.  Potassium of 3.5 today.  Will continue to replenish.  Goals of care.  Had a prolonged discussion with the patient's  husband and the patient herself at bedside on 06/14/2023.  Plan is to proceed with palliative care hospice at home. Overall condition of the patient is poor due to increasing oxygen needs, debility deconditioning and frailty.   DVT prophylaxis: apixaban (ELIQUIS) tablet 2.5 mg Start: 06/02/23 2300 SCDs Start: 06/02/23 2250 apixaban (ELIQUIS) tablet 2.5 mg   Code Status:     Code Status: DNR   Disposition:   Likely disposition in 1 to 2 days with home health/home hospice.  TOC/palliative care has been consulted.  Status is: Inpatient Remains inpatient appropriate because: IV antibiotic, hypoxia, disposition plan.   Family Communication:   Spoke with the patient's spouse at bedside.   Consultants:  Infectious disease PCCM Palliative care.  Procedures:  PICC line placement  Antimicrobials:  Ampicillin 8/20>   Subjective:  Today, patient was seen and examined at bedside.  Patient feels little better had bowel movement yesterday.  Has less dysuria today.  Has mild shortness of breath but no overt dyspnea.  Denies any chest pain.  Inquiring about going home.  Patient's husband at bedside.    Objective: Vitals:   06/14/23 2347 06/15/23 0457 06/15/23 0741 06/15/23 0959  BP: 137/77   (!) 165/87  Pulse: 79 79 80 80  Resp: 20 16 17    Temp: 98.7 F (37.1 C) 98.5 F (36.9 C)    TempSrc: Oral Oral    SpO2: 94%     Weight:  60.5 kg    Height:        Intake/Output Summary (Last 24 hours) at 06/15/2023 1103 Last data  filed at 06/14/2023 2128 Gross per 24 hour  Intake 2633.41 ml  Output 700 ml  Net 1933.41 ml   Filed Weights   06/13/23 0523 06/14/23 0425 06/15/23 0457  Weight: 58.2 kg 59.9 kg 60.5 kg    Physical Examination: Body mass index is 26.05 kg/m.   General:  Average built, not in obvious distress, on high flow nasal cannula oxygen At 30 L/min.  HENT:   No scleral pallor or icterus noted. Oral mucosa is moist.  Elderly female. Chest:  Diminished breath sounds  bilaterally.  Coarse breath sounds noted. CVS: S1 &S2 heard. No murmur.  Regular rate and rhythm. Abdomen: Soft, nontender, nondistended.  Bowel sounds are heard.   Extremities: No cyanosis, clubbing or edema.  Peripheral pulses are palpable. Psych: Alert, awake and oriented, normal mood CNS:  No cranial nerve deficits.  Power equal in all extremities.   Skin: Warm and dry.  No rashes noted.  Data Reviewed:   CBC: Recent Labs  Lab 06/11/23 0028 06/12/23 0036 06/12/23 0922 06/13/23 0500 06/14/23 0539 06/15/23 0023  WBC 11.7*  --  9.2 12.5* 13.8* 11.8*  HGB 9.2* 8.9* 9.4* 9.4* 9.6* 8.6*  HCT 30.3* 29.8* 30.9* 30.6* 31.2* 26.9*  MCV 80.4  --  79.2* 80.5 77.8* 76.4*  PLT 301  --  331 350 373 327    Basic Metabolic Panel: Recent Labs  Lab 06/11/23 0028 06/12/23 0140 06/13/23 0500 06/14/23 0539 06/15/23 0023  NA 138 133* 134* 131* 126*  K 3.9 3.9 3.7 3.2* 3.5  CL 89* 86* 85* 83* 80*  CO2 38* 37* 39* 38* 35*  GLUCOSE 168* 202* 166* 174* 189*  BUN 32* 28* 33* 32* 32*  CREATININE 1.24* 1.21* 0.96 0.91 0.87  CALCIUM 7.6* 7.7* 8.4* 8.2* 7.6*  MG 2.2 2.1 2.3 2.1 2.0    Liver Function Tests: Recent Labs  Lab 06/11/23 0028 06/12/23 0140 06/13/23 0500  AST 26 28 23   ALT 71* 68* 56*  ALKPHOS 52 50 45  BILITOT 0.6 0.5 0.8  PROT 5.5* 5.3* 5.3*  ALBUMIN 3.0* 2.8* 2.8*     Radiology Studies: DG CHEST PORT 1 VIEW  Result Date: 06/14/2023 CLINICAL DATA:  Hypoxia, abdominal pain, hypertension EXAM: PORTABLE CHEST 1 VIEW COMPARISON:  Chest x-ray dated June 11, 2023 FINDINGS: Cardiac and mediastinal contours are within normal limits. Unchanged position of right arm PICC and left chest wall pacer. Stable small left pleural effusion. Mild diffuse interstitial opacities, unchanged. Skinfold overlying the lateral left hemithorax. IMPRESSION: 1. Stable small left pleural effusion. 2. Stable mild diffuse interstitial opacities, likely due to pulmonary edema. Electronically Signed    By: Allegra Lai M.D.   On: 06/14/2023 10:18      LOS: 13 days   Joycelyn Das, MD [facility] Available via Epic secure chat 7am-7pm After these hours, please refer to coverage provider listed on [email or web address] 06/15/2023, 11:03 AM

## 2023-06-16 DIAGNOSIS — I160 Hypertensive urgency: Secondary | ICD-10-CM | POA: Diagnosis not present

## 2023-06-16 DIAGNOSIS — J9621 Acute and chronic respiratory failure with hypoxia: Secondary | ICD-10-CM | POA: Diagnosis not present

## 2023-06-16 DIAGNOSIS — J441 Chronic obstructive pulmonary disease with (acute) exacerbation: Secondary | ICD-10-CM | POA: Diagnosis not present

## 2023-06-16 DIAGNOSIS — R7881 Bacteremia: Secondary | ICD-10-CM | POA: Diagnosis not present

## 2023-06-16 DIAGNOSIS — Z7189 Other specified counseling: Secondary | ICD-10-CM

## 2023-06-16 DIAGNOSIS — I480 Paroxysmal atrial fibrillation: Secondary | ICD-10-CM | POA: Diagnosis not present

## 2023-06-16 DIAGNOSIS — Z515 Encounter for palliative care: Secondary | ICD-10-CM

## 2023-06-16 DIAGNOSIS — L989 Disorder of the skin and subcutaneous tissue, unspecified: Secondary | ICD-10-CM | POA: Diagnosis not present

## 2023-06-16 LAB — BASIC METABOLIC PANEL
Anion gap: 12 (ref 5–15)
BUN: 28 mg/dL — ABNORMAL HIGH (ref 8–23)
CO2: 36 mmol/L — ABNORMAL HIGH (ref 22–32)
Calcium: 7.9 mg/dL — ABNORMAL LOW (ref 8.9–10.3)
Chloride: 79 mmol/L — ABNORMAL LOW (ref 98–111)
Creatinine, Ser: 0.77 mg/dL (ref 0.44–1.00)
GFR, Estimated: >60 mL/min (ref >60)
Glucose, Bld: 156 mg/dL — ABNORMAL HIGH (ref 70–99)
Potassium: 3.9 mmol/L (ref 3.5–5.1)
Sodium: 127 mmol/L — ABNORMAL LOW (ref 135–145)

## 2023-06-16 LAB — METANEPHRINES, PLASMA
Metanephrine, Free: 75.5 pg/mL (ref 0.0–88.0)
Normetanephrine, Free: 151.8 pg/mL (ref 0.0–285.2)

## 2023-06-16 LAB — CBC
HCT: 27.3 % — ABNORMAL LOW (ref 36.0–46.0)
Hemoglobin: 8.5 g/dL — ABNORMAL LOW (ref 12.0–15.0)
MCH: 24.2 pg — ABNORMAL LOW (ref 26.0–34.0)
MCHC: 31.1 g/dL (ref 30.0–36.0)
MCV: 77.8 fL — ABNORMAL LOW (ref 80.0–100.0)
Platelets: 327 10*3/uL (ref 150–400)
RBC: 3.51 MIL/uL — ABNORMAL LOW (ref 3.87–5.11)
RDW: 22.5 % — ABNORMAL HIGH (ref 11.5–15.5)
WBC: 12.7 10*3/uL — ABNORMAL HIGH (ref 4.0–10.5)
nRBC: 0.2 % (ref 0.0–0.2)

## 2023-06-16 LAB — MAGNESIUM: Magnesium: 2.1 mg/dL (ref 1.7–2.4)

## 2023-06-16 MED ORDER — ALUM & MAG HYDROXIDE-SIMETH 200-200-20 MG/5ML PO SUSP
15.0000 mL | Freq: Four times a day (QID) | ORAL | Status: DC | PRN
  Administered 2023-06-16: 15 mL via ORAL
  Filled 2023-06-16: qty 30

## 2023-06-16 MED ORDER — METHYLPREDNISOLONE SODIUM SUCC 40 MG IJ SOLR
40.0000 mg | Freq: Two times a day (BID) | INTRAMUSCULAR | Status: DC
  Administered 2023-06-16 – 2023-06-17 (×2): 40 mg via INTRAVENOUS
  Filled 2023-06-16 (×2): qty 1

## 2023-06-16 NOTE — Plan of Care (Signed)

## 2023-06-16 NOTE — Progress Notes (Signed)
PROGRESS NOTE    [name]  WUJ:[id number] DOB: 26-Mar-1941 DOA: 06/23/2023 PCP: Leane Call, PA-C    Brief Narrative:   82 years old female with past medical history of COPD on home oxygen at 2 L/min, coronary artery disease, carotid artery occlusion, congestive heart failure, hyperlipidemia, paroxysmal atrial tachycardia, history of seizure and chronic back pain and asthma presented to hospital with elevated blood pressure with systolic at 240 with leg edema.  Patient was then admitted hospital for further evaluation and treatment.   Assessment and Plan:  COPD with acute exacerbation,  Acute on chronic hypoxic respiratory failure Has been receiving high-dose IV steroids.  Continue bronchodilators.  On ampicillin and received Levaquin.  Currently on 35 L of oxygen per high flow nasal cannula/minute.  Patient is on 2 L of oxygen at baseline.  Has been requiring increased oxygen demand with ongoing dyspnea and shortness of breath.  Patient does not seem to be improving.   Continue Singulair. Palliative care Johny Shock has been consulted at this time, pending assessment.  Plan for hospice has been communicated with TOC.  Leg swelling Received as needed IV Lasix.  Will continue intake and output charting Daily weights.  Negative balance for 5933 mL.  Will hold off with further dose today.  Anemia Iron profile showed low iron.  Folate level of 31.  Vitamin B12 elevated.  Continue iron sulfate.  Hypertensive emergency Currently on Coreg and Cardizem.  Hydralazine as needed.  Blood pressure has  improved.  Enterococcal bacteremia.   TEE without any infected leads.  CT of the abdomen pelvis normal.  ID on board and recommend 6 weeks of outpatient treatment with ampicillin.  PICC line placed 06/10/2023.  At this time plan is possible hospice on discharge.  Chronic A-fib (HCC) Continue Eliquis.  Hypokalemia.  Borderline low.  Potassium of 3.9 today.   Goals of care.  Awaiting  hospice evaluation   DVT prophylaxis: apixaban (ELIQUIS) tablet 2.5 mg Start: 06/13/2023 2300 SCDs Start: 06/13/2023 2250 apixaban (ELIQUIS) tablet 2.5 mg   Code Status:     Code Status: DNR   Disposition:   Likely disposition in 1 to 2 days with home health/home hospice.  TOC/palliative care has been consulted.  Status is: Inpatient  Remains inpatient appropriate because: IV antibiotic, hypoxia, plan for hospice   Family Communication:   Spoke with the patient's spouse at bedside on 06/16/2023.   Consultants:  Infectious disease PCCM Palliative care.  Procedures:  PICC line placement  Antimicrobials:  Ampicillin 8/20>   Subjective:  Today, patient was seen and examined at bedside.  Still complains of shortness of breath dyspnea and has required 35 L of high flow nasal cannula oxygen.  Feels weak and debilitated.  Patient has been at bedside.  Has been having trouble urinating and bladder scan underway.  Feels anxious.  Objective: Vitals:   06/16/23 0552 06/16/23 0627 06/16/23 0744 06/16/23 0836  BP:  138/74  (!) 156/91  Pulse: 80 81 80 83  Resp: 15 19 18 20   Temp: 98.1 F (36.7 C)   97.9 F (36.6 C)  TempSrc: Oral   Axillary  SpO2:   97% 96%  Weight: 60.3 kg     Height:        Intake/Output Summary (Last 24 hours) at 06/16/2023 1150 Last data filed at 06/16/2023 0600 Gross per 24 hour  Intake 873.93 ml  Output 400 ml  Net 473.93 ml   Filed Weights   06/14/23 0425 06/15/23 0457  06/16/23 0552  Weight: 59.9 kg 60.5 kg 60.3 kg    Physical Examination: Body mass index is 25.96 kg/m.   General:  Average built, not in obvious distress, on 35 L/min, high flow nasal cannula.Marland Kitchen  HENT:   No scleral pallor or icterus noted. Oral mucosa is moist.  Elderly female. Chest:  Diminished breath sounds bilaterally.  Coarse breath sounds noted. CVS: S1 &S2 heard. No murmur.  Regular rate and rhythm. Abdomen: Soft, nontender, nondistended.  Bowel sounds are heard.    Extremities: No cyanosis, clubbing or edema.  Peripheral pulses are palpable. Psych: Alert, awake and oriented, flat affect, CNS:  No cranial nerve deficits.  Generalized weakness noted. Skin: Warm and dry.  No rashes noted.  Data Reviewed:   CBC: Recent Labs  Lab 06/12/23 0922 06/13/23 0500 06/14/23 0539 06/15/23 0023 06/16/23 0714  WBC 9.2 12.5* 13.8* 11.8* 12.7*  HGB 9.4* 9.4* 9.6* 8.6* 8.5*  HCT 30.9* 30.6* 31.2* 26.9* 27.3*  MCV 79.2* 80.5 77.8* 76.4* 77.8*  PLT 331 350 373 327 327    Basic Metabolic Panel: Recent Labs  Lab 06/12/23 0140 06/13/23 0500 06/14/23 0539 06/15/23 0023 06/16/23 0714  NA 133* 134* 131* 126* 127*  K 3.9 3.7 3.2* 3.5 3.9  CL 86* 85* 83* 80* 79*  CO2 37* 39* 38* 35* 36*  GLUCOSE 202* 166* 174* 189* 156*  BUN 28* 33* 32* 32* 28*  CREATININE 1.21* 0.96 0.91 0.87 0.77  CALCIUM 7.7* 8.4* 8.2* 7.6* 7.9*  MG 2.1 2.3 2.1 2.0 2.1    Liver Function Tests: Recent Labs  Lab 06/11/23 0028 06/12/23 0140 06/13/23 0500  AST 26 28 23   ALT 71* 68* 56*  ALKPHOS 52 50 45  BILITOT 0.6 0.5 0.8  PROT 5.5* 5.3* 5.3*  ALBUMIN 3.0* 2.8* 2.8*     Radiology Studies: No results found.    LOS: 14 days   Joycelyn Das, MD [facility] Available via Epic secure chat 7am-7pm After these hours, please refer to coverage provider listed on [email or web address] 06/16/2023, 11:50 AM

## 2023-06-16 NOTE — TOC Progression Note (Addendum)
Transition of Care [facility]) - Progression Note    Patient Details  Name: CURRAN POTENZA MRN: [id number] Date of Birth: 06-22-1941  Transition of [facility]) CM/SW Contact  Tom-Johnson, Hershal Coria, RN Phone Number: 06/16/2023, 4:14 PM  Clinical Narrative:     CM consulted for Home with Hospice. CM met with patient and husband at bedside and explained referral. CM gave husband list of Hospice Agencies from [email or web address] Husband states he spoke with Palliative and has decided not to make a decision now d/t patient's high need of O2. Patient is currenty on 35 L HFNC. Wants to see if demand decreases before deciding.   CM will continue to follow.       Expected Discharge Plan: Home w Home Health Services Barriers to Discharge: Continued Medical Work up  Expected Discharge Plan and Services In-house Referral: NA Discharge Planning Services: CM Consult Post Acute Care Choice: Home Health Living arrangements for the past 2 months: Single Family Home                   DME Agency: NA                   Social Determinants of Health (SDOH) Interventions SDOH Screenings   Food Insecurity: No Food Insecurity (06/03/2023)  Housing: Low Risk  (06/03/2023)  Transportation Needs: No Transportation Needs (06/03/2023)  Utilities: Not At Risk (06/03/2023)  Tobacco Use: High Risk (05/30/2023)    Readmission Risk Interventions     No data to display

## 2023-06-16 NOTE — Consult Note (Signed)
Palliative Care Consult Note                                  Date: 06/16/2023   Patient Name: [name]  DOB: 1941-03-21  MRN: [id number]  Age / Sex: 82 y.o., female  PCP: Nodal, Joline Salt, PA-C Referring Physician: Joycelyn Das, MD  Reason for Consultation: {Reason for Consult:23484}  HPI/Patient Profile: 82 y.o. female  with past medical history of COPD on home oxygen 2 liters, CAD, HFpEF, carotid artery occlusion, paroxysmal atrial tachycardia, pacemaker placement, hyperlipidemia, history of seizure, and chronic back pain who presented to the ED on 06/03/2023 with elevated blood pressure and leg edema.  She was admitted with hypertensive urgency and COPD with acute exacerbation.  She was also found to have enterococcal bacteremia.  TEE was done 8/16 and showed no evidence of endocarditis.  Hospitalization has been complicated by worsening acute on chronic hypoxemia with increasing oxygen requirements.  Past Medical History:  Diagnosis Date   Allergy    Anxiety    Arthritis    Asthma    Bronchitis    CAD (coronary artery disease)    Cancer (HCC)    skin cancer right arm   Carotid artery occlusion    Cataracts, bilateral    CHF (congestive heart failure) (HCC)    Chronic airway obstruction, not elsewhere classified    COPD (chronic obstructive pulmonary disease) (HCC)    Dysphagia, unspecified(787.20)    Generalized convulsive epilepsy without mention of intractable epilepsy    GERD (gastroesophageal reflux disease)    Headache    Hearing impaired    Hyperlipidemia    Hypersomnia with sleep apnea, unspecified    Osteoporosis    Oxygen desaturation during sleep    PRN;02 liters   Paroxysmal atrial tachycardia    PONV (postoperative nausea and vomiting)    " I had problems where my heart starts speeding and fluttering; it goes away by itself ."   Pure hypercholesterolemia    Reflux    Rheumatic fever    as a  child   Seizures (HCC)    Shortness of breath 03/11/2010   while laying still   Wears glasses    Wears hearing aids     Subjective:   I have reviewed medical records including EPIC notes, labs and imaging, and assessed the patient at bedside. She is sleepy/lethargic, has recently received Ativan for anxiety.  She will awaken with prompting, and is able to intermittently engage in GOC discussion.   I met with her husband [name], son [name], and daughter [name] at bedside to discuss diagnosis, prognosis, GOC, EOL wishes, disposition, and options.  I introduced Palliative Medicine as specialized medical care for people living with serious illness. It focuses on providing relief from the symptoms and stress of a serious illness.   We discussed patient's current illness and what it means in the larger context of his/her ongoing co-morbidities. Current clinical status was reviewed. Natural disease trajectory of *** was discussed.  Created space and opportunity for patient and family to explore thoughts and feelings regarding current medical situation. Values and goals of care important to patient and family were attempted to be elicited.  Questions and concerns addressed. Patient/family encouraged to call with questions or concerns.     Life Review: ***  Functional Status: ***  Patient/Family Understanding of Illness: ***  Patient Values: ***  Goals: ***  Additional  Discussion: Patient and family both report that her overall health has "gone downhill"   Review of Systems  Objective:   Primary Diagnoses: Present on Admission:  A-fib (HCC)   Physical Exam  Vital Signs:  BP (!) 151/83 (BP Location: Left Arm)   Pulse 79   Temp 98 F (36.7 C) (Oral)   Resp 20   Ht 5' (1.524 m)   Wt 60.3 kg   SpO2 96%   BMI 25.96 kg/m   Palliative Assessment/Data: ***     Assessment & Plan:   SUMMARY OF RECOMMENDATIONS   Continue current interventions for now  I will follow-up  with patient and family tomorrow  Primary Decision Maker: {Primary Decision OZHYQ:65784}  Code Status/Advance Care Planning: {Palliative Code status:23503}  Symptom Management:  ***  Prognosis:  {Palliative Care Prognosis:23504}  Discharge Planning:  {Palliative dispostion:23505}   Discussed with: ***    Thank you for allowing [place] to participate in the care of Cathe D Feliciano   Time Total: ***  Greater than 50%  of this time was spent counseling and coordinating care related to the above assessment and plan.  Signed by: Sherlean Foot, NP Palliative Medicine Team  Team Phone # [phone]  For individual providers, please see AMION

## 2023-06-17 DIAGNOSIS — L989 Disorder of the skin and subcutaneous tissue, unspecified: Secondary | ICD-10-CM | POA: Diagnosis not present

## 2023-06-17 DIAGNOSIS — J9601 Acute respiratory failure with hypoxia: Secondary | ICD-10-CM | POA: Diagnosis not present

## 2023-06-17 DIAGNOSIS — J441 Chronic obstructive pulmonary disease with (acute) exacerbation: Secondary | ICD-10-CM | POA: Diagnosis not present

## 2023-06-17 DIAGNOSIS — Z515 Encounter for palliative care: Secondary | ICD-10-CM | POA: Diagnosis not present

## 2023-06-17 DIAGNOSIS — R7881 Bacteremia: Secondary | ICD-10-CM | POA: Diagnosis not present

## 2023-06-17 DIAGNOSIS — I480 Paroxysmal atrial fibrillation: Secondary | ICD-10-CM | POA: Diagnosis not present

## 2023-06-17 MED ORDER — LORAZEPAM 2 MG/ML IJ SOLN
1.0000 mg | INTRAMUSCULAR | Status: DC | PRN
  Administered 2023-06-17: 2 mg via INTRAVENOUS
  Filled 2023-06-17: qty 1

## 2023-06-17 MED ORDER — BIOTENE DRY MOUTH MT LIQD: 15.0000 mL | OROMUCOSAL | Status: DC | PRN

## 2023-06-17 MED ORDER — POLYVINYL ALCOHOL 1.4 % OP SOLN: 1.0000 [drp] | Freq: Four times a day (QID) | OPHTHALMIC | Status: DC | PRN

## 2023-06-17 MED ORDER — MORPHINE BOLUS VIA INFUSION
2.0000 mg | INTRAVENOUS | Status: DC | PRN
  Administered 2023-06-17 (×7): 2 mg via INTRAVENOUS

## 2023-06-17 MED ORDER — MORPHINE 100MG IN NS 100ML (1MG/ML) PREMIX INFUSION
1.0000 mg/h | INTRAVENOUS | Status: DC
  Administered 2023-06-17: 1 mg/h via INTRAVENOUS
  Filled 2023-06-17: qty 100

## 2023-06-17 MED ORDER — HALOPERIDOL LACTATE 5 MG/ML IJ SOLN: 0.5000 mg | INTRAMUSCULAR | Status: DC | PRN

## 2023-06-17 MED ORDER — GLYCOPYRROLATE 0.2 MG/ML IJ SOLN
0.2000 mg | INTRAMUSCULAR | Status: DC | PRN
  Administered 2023-06-17: 0.2 mg via INTRAVENOUS
  Filled 2023-06-17: qty 1

## 2023-06-17 NOTE — Progress Notes (Signed)
Patient daughter at the bedside all night asking patient a lot questions all night and giving her water frequently as well. She refused ativan for the patient, called respiratory for heated high  flow adjustment , sat at 100% on 60L/40% day nurse updated, will continue to monitor.

## 2023-06-17 NOTE — Progress Notes (Signed)
PROGRESS NOTE    [name]  GLO:[id number] DOB: January 05, 1941 DOA: 06/12/2023 PCP: Melinda Call, PA-C    Brief Narrative:   82 years old female with past medical history of COPD on home oxygen at 2 L/min, coronary artery disease, carotid artery occlusion, congestive heart failure, hyperlipidemia, paroxysmal atrial tachycardia, history of seizure and chronic back pain and asthma presented to hospital with elevated blood pressure with systolic at 240 with leg edema.  Patient was then admitted to the  hospital for further evaluation and treatment.   Assessment and Plan:  COPD with acute exacerbation,  Acute on chronic hypoxic respiratory failure Has been receiving high-dose IV steroids.  Continue bronchodilators.  On ampicillin and received Levaquin.  Currently on 60 L of oxygen per high flow nasal cannula/minute.  Patient is on 2 L of oxygen at baseline.  Has been requiring increased oxygen demand with ongoing dyspnea and shortness of breath.  CT chest on 06/12/2023 showed complete left lower lobe collapse and small left greater than right pleural effusion with mild consolidation of the right upper lobe.  Pulmonary was consulted at that time with high suspicion for aspiration during TEE.  Recommendation was pulmonary toileting and repeat chest x-ray in 4 to 6 weeks.  Already on ampicillin.  No further plans for bronchoscopy were made.  Patient does not seem to be improving at this time.   Continue Singulair.  Palliative care on board at this time.  Patient has clearly expressed no aggressive treatment and wishes to pass away comfortably.  Palliative care to help further family meeting and discussion today.  Poor prognosis.  Hyponatremia.  Latest sodium level of 127. Will continue to monitor.  Leg swelling Received as needed IV Lasix.  Will continue intake and output charting Daily weights.  Negative balance for 7733 mL so far.hold off with further IV Lasix today.  Anemia Iron profile  showed low iron.  Folate level of 31.  Vitamin B12 elevated.  Continue iron sulfate.  Hypertensive emergency Currently on Coreg and Cardizem.  Hydralazine as needed.  Blood pressure has  improved.  Enterococcal bacteremia.   TEE without any infected leads.  CT of the abdomen pelvis normal.  ID on board and recommend 6 weeks of outpatient treatment with ampicillin.  PICC line placed 06/10/2023.  Currently receiving IV antibiotic.  Patient is overall not doing well  Chronic A-fib (HCC) Continue Eliquis.  Hypokalemia.  Borderline low.  Latest potassium of 3.9.  Goals of care.  Palliative care on board.  Hospice evaluation underway.  Palliative care to have further discussion today about goals of care.   DVT prophylaxis: apixaban (ELIQUIS) tablet 2.5 mg Start: 05/26/2023 2300 SCDs Start: 06/19/2023 2250 apixaban (ELIQUIS) tablet 2.5 mg   Code Status:     Code Status: DNR.  Poor prognosis.  Disposition:   Uncertain at this time.  Requiring high flow oxygen at this time limiting disposition.  Palliative care on board.  Status is: Inpatient  Remains inpatient appropriate because: IV antibiotic, hypoxia, plan for hospice, further goals of care discussion.   Family Communication:  Spoke with the patient's spouse at bedside on 06/16/2023.  Spoke with the patient's daughter at bedside on 06/22/2023   Consultants:  Infectious disease PCCM Palliative care.  Procedures:  PICC line placement  Antimicrobials:  Ampicillin 8/20>   Subjective:  Today, patient was seen and examined at bedside.  Complains of dyspnea weakness shortness of breath.  Feels frustrated as she is still in the hospital.  Patient daughter at bedside.    Objective: Vitals:   06/04/2023 0618 06/21/2023 0620 05/29/2023 0730 05/31/2023 1135  BP:   133/84 (!) 174/80  Pulse: 80  80 80  Resp: 18  (!) 28 18  Temp:   98 F (36.7 C) 97.7 F (36.5 C)  TempSrc:   Oral Oral  SpO2:  100% 98% 100%  Weight:      Height:         Intake/Output Summary (Last 24 hours) at 06/20/2023 1142 Last data filed at 06/16/2023 0615 Gross per 24 hour  Intake 100 ml  Output 1900 ml  Net -1800 ml   Filed Weights   06/15/23 0457 06/16/23 0552 06/20/2023 0500  Weight: 60.5 kg 60.3 kg 60 kg    Physical Examination: Body mass index is 25.83 kg/m.   General:  Average built,, deconditioned weak and frail, on 60 L of oxygen by high flow nasal cannula.  Communicative, in moderate distress.   HENT:   No scleral pallor or icterus noted. Oral mucosa is mildly dry.. Chest:  Diminished breath sounds bilaterally.  Coarse breath sounds noted. CVS: S1 &S2 heard. No murmur.  Regular rate and rhythm. Abdomen: Soft, nontender, nondistended.  Bowel sounds are heard.   Extremities: No cyanosis, clubbing or edema.  Peripheral pulses are palpable. Psych: Alert, awake and Communicative, in mild distress, tachypneic, CNS:  No cranial nerve deficits.  Generalized weakness noted. Skin: Warm and dry.  No rashes noted.  Diffuse bruising noted on the skin.  Data Reviewed:   CBC: Recent Labs  Lab 06/12/23 0922 06/13/23 0500 06/14/23 0539 06/15/23 0023 06/16/23 0714  WBC 9.2 12.5* 13.8* 11.8* 12.7*  HGB 9.4* 9.4* 9.6* 8.6* 8.5*  HCT 30.9* 30.6* 31.2* 26.9* 27.3*  MCV 79.2* 80.5 77.8* 76.4* 77.8*  PLT 331 350 373 327 327    Basic Metabolic Panel: Recent Labs  Lab 06/12/23 0140 06/13/23 0500 06/14/23 0539 06/15/23 0023 06/16/23 0714  NA 133* 134* 131* 126* 127*  K 3.9 3.7 3.2* 3.5 3.9  CL 86* 85* 83* 80* 79*  CO2 37* 39* 38* 35* 36*  GLUCOSE 202* 166* 174* 189* 156*  BUN 28* 33* 32* 32* 28*  CREATININE 1.21* 0.96 0.91 0.87 0.77  CALCIUM 7.7* 8.4* 8.2* 7.6* 7.9*  MG 2.1 2.3 2.1 2.0 2.1    Liver Function Tests: Recent Labs  Lab 06/11/23 0028 06/12/23 0140 06/13/23 0500  AST 26 28 23   ALT 71* 68* 56*  ALKPHOS 52 50 45  BILITOT 0.6 0.5 0.8  PROT 5.5* 5.3* 5.3*  ALBUMIN 3.0* 2.8* 2.8*     Radiology Studies: No  results found.    LOS: 15 days   Joycelyn Das, MD [facility] Available via Epic secure chat 7am-7pm After these hours, please refer to coverage provider listed on [email or web address] 06/23/2023, 11:42 AM

## 2023-06-17 NOTE — Progress Notes (Signed)
Pt passed at 2122 with family at bedside.  Verification with auscultation provided by self and Francene Castle, RN.  Dr. Antionette Char notified.  Personal belongings sent with patient husband.

## 2023-06-17 NOTE — Progress Notes (Signed)
    1315  Spiritual Encounters  Type of Visit Initial  Care provided to: Pt and family  Conversation partners present during encounter Nurse  Referral source Nurse (RN/NT/LPN)  Reason for visit End-of-life  OnCall Visit Yes   Chaplain responded to visit a patient who is actively dying. Chaplain met with patient's family. Chaplain offered prayer and support.Chaplain extended hospitality.Chaplain introduced spiritual care services. Spiritual care services available as needed.   Alda Ponder, Chaplain

## 2023-06-25 NOTE — Progress Notes (Signed)
Palliative Medicine Progress Note   Patient Name: [name]       Date: 05/26/2023 DOB: [birth year]  Age: 82 y.o. MRN#: [id number] Attending Physician: No att. providers found Primary Care Physician: Leane Call, PA-C Admit Date: 06/07/2023  Reason for Consultation/Follow-up: {Reason for Consult:23484}  HPI/Patient Profile: 82 y.o. female  with past medical history of COPD on home oxygen 2 liters, CAD, HFpEF, carotid artery occlusion, paroxysmal atrial tachycardia, pacemaker placement, hyperlipidemia, history of seizure, and chronic back pain who presented to the ED on 06/03/2023 with elevated blood pressure and leg edema.  She was admitted with hypertensive urgency and COPD with acute exacerbation.  She was also found to have enterococcal bacteremia.  TEE was done 8/16 and showed no evidence of endocarditis.  Hospitalization has been complicated by worsening acute on chronic hypoxemia with increasing oxygen requirements.   Palliative Medicine was consulted for goals of care.   Subjective: ***  Objective:  Physical Exam          Vital Signs: BP (!) 174/80 (BP Location: Left Arm)   Pulse 80   Temp 97.7 F (36.5 C) (Oral)   Resp 18   Ht 5' (1.524 m)   Wt 60 kg   SpO2 (!) 80%   BMI 25.83 kg/m  SpO2: SpO2: (!) 80 % O2 Device: O2 Device: Room Air (Pt taken off HHFNC to comfort per family and pallative) O2 Flow Rate: O2 Flow Rate (L/min): 60 L/min  Intake/output summary:  Intake/Output Summary (Last 24 hours) at 06/16/2023 1245 Last data filed at  2200 Gross per 24 hour  Intake 42.19 ml  Output 325 ml  Net -282.81 ml    LBM: Last BM Date : 06/15/23     Palliative Assessment/Data: ***     Palliative Medicine Assessment & Plan   Assessment: Principal  Problem:   Bacteremia due to Enterococcus Active Problems:   A-fib (HCC)   COPD with acute exacerbation (HCC)   Hypertensive emergency   Anemia   Leg swelling   Pacemaker infection (HCC)   Left lower quadrant abdominal pain   Foot lesion    Recommendations/Plan: ***  Goals of Care and Additional Recommendations: Limitations on Scope of Treatment: {Recommended Scope and Preferences:21019}  Code Status:   Prognosis:  {Palliative Care Prognosis:23504}  Discharge Planning: {Palliative dispostion:23505}  Care plan was discussed with ***  Thank you for allowing the Palliative Medicine Team to assist in the care of this patient.   ***   Merry Proud, NP   Please contact Palliative Medicine Team phone at [phone] for questions and concerns.  For individual providers, please see AMION.

## 2023-06-25 NOTE — Progress Notes (Signed)
IV morphine drip wasted with Francene Castle, RN in [employer] 60 ml.

## 2023-06-25 NOTE — Discharge Summary (Addendum)
DEATH SUMMARY   Patient Details  Name: RAYNIA NISWONGER MRN: [id number] DOB: [birth year] WUJ:[id number], Joline Salt, PA-C  Admission/Discharge Information   Admit Date:  Jun 09, 2023  Date of Death: Date of Death: 2023-06-24  Time of Death: Time of Death: 09-19-21  Length of Stay: 09/16/23   Principle Cause of death: Respiratory failure  Hospital Diagnoses: Principal Problem:   Bacteremia due to Enterococcus Active Problems:   A-fib (HCC)   COPD with acute exacerbation (HCC)   Hypertensive emergency   Anemia   Leg swelling   Pacemaker infection (HCC)   Left lower quadrant abdominal pain   Foot lesion   Hospital Course: 82 years old female with past medical history of COPD on home oxygen at 2 L/min, coronary artery disease, carotid artery occlusion, congestive heart failure, hyperlipidemia, paroxysmal atrial tachycardia, history of seizure and chronic back pain and asthma presented to the hospital with elevated blood pressure with systolic at 240 with leg edema.  Patient was then admitted to the  hospital for further evaluation and treatment.   Following conditions were addressed during hospitalization,.   COPD with acute exacerbation,  Acute on chronic hypoxic respiratory failure Received IV high-dose steroids bronchodilators during hospitalization and was on ampicillin.  Patient had worsening dyspnea shortness of breath and hypoxia required up to 6 L of oxygen by high flow nasal cannula. CT chest on 06/12/2023 showed complete left lower lobe collapse and small left greater than right pleural effusion with mild consolidation of the right upper lobe.  Pulmonary was consulted  with high suspicion for aspiration during TEE.  Recommendation was pulmonary toileting and repeat chest x-ray in 4 to 6 weeks. No  plans for bronchoscopy were made.  Patient continued to deteriorate during hospitalization and clearly expressed that she did not want to go through aggressive treatment including intubation so  palliative care was consulted.  Subsequently patient was put on comfort care.    Hyponatremia.  Latest sodium level was 127.   Leg swelling Received as needed IV Lasix.     Anemia Iron profile showed low iron.  Folate level of 31.  Vitamin B12 elevated.  Was on iron sulfate  Hypertensive Urgency Received Coreg and Cardizem.     Enterococcal bacteremia.   TEE without any infected leads.  CT of the abdomen pelvis normal.  ID  was on board and recommend 6 weeks of outpatient treatment with ampicillin and PICC line was placed 06/10/2023.  IV antibiotic during hospitalization.    Chronic A-fib (HCC) Was on Eliquis.   Hypokalemia.     Goals of care.  Palliative care consulted and patient was transition to comfort care during hospitalization.  Procedures: PICC line placement  Consultations:  PCCM Palliative care Infectious disease  The results of significant diagnostics from this hospitalization (including imaging, microbiology, ancillary and laboratory) are listed below for reference.   Significant Diagnostic Studies: DG CHEST PORT 1 VIEW  Result Date: 06/14/2023 CLINICAL DATA:  Hypoxia, abdominal pain, hypertension EXAM: PORTABLE CHEST 1 VIEW COMPARISON:  Chest x-ray dated June 11, 2023 FINDINGS: Cardiac and mediastinal contours are within normal limits. Unchanged position of right arm PICC and left chest wall pacer. Stable small left pleural effusion. Mild diffuse interstitial opacities, unchanged. Skinfold overlying the lateral left hemithorax. IMPRESSION: 1. Stable small left pleural effusion. 2. Stable mild diffuse interstitial opacities, likely due to pulmonary edema. Electronically Signed   By: Allegra Lai M.D.   On: 06/14/2023 10:18   CT CHEST WO CONTRAST  Result  Date: 06/12/2023 CLINICAL DATA:  Shortness of breath, pneumonia suspected EXAM: CT CHEST WITHOUT CONTRAST TECHNIQUE: Multidetector CT imaging of the chest was performed following the standard protocol without  IV contrast. RADIATION DOSE REDUCTION: This exam was performed according to the departmental dose-optimization program which includes automated exposure control, adjustment of the mA and/or kV according to patient size and/or use of iterative reconstruction technique. COMPARISON:  Chest x-ray dated June 11, 2023; CT abdomen and pelvis dated June 09, 2023 FINDINGS: Cardiovascular: Cardiomegaly. No pericardial effusion. Normal caliber thoracic aorta with moderate calcified plaque. Severe calcifications of the partially visualized abdominal aorta causing intraluminal narrowing. Mediastinum/Nodes: Esophagus and thyroid are unremarkable. No pathologically enlarged lymph nodes seen in the chest. Lungs/Pleura: Central airways are patent. Severe centrilobular emphysema. Complete left lower lobe collapse. Small left-greater-than-right pleural effusions. Mild consolidation of the posterior right upper lobe. Irregular solid nodule of the left upper lobe measuring 6 mm on series 4, image 61. No evidence of pneumothorax. Upper Abdomen: Partially visualized cystic left renal lesions, better visualized on recent CT of the abdomen and pelvis. No acute abnormality. Musculoskeletal: Severe age-indeterminate compression deformity of T8. Moderate age indeterminate compression deformities of T9. Additional mild-to-moderate compression deformities of the lower thoracic/upper lumbar spine are unchanged when compared with prior CT of the abdomen and pelvis. No aggressive appearing osseous lesions. IMPRESSION: 1. Complete left lower lobe collapse and small left-greater-than-right pleural effusions. 2. Mild consolidation of the posterior right upper lobe and irregular solid nodule of the left upper lobe measuring 6 mm, likely infectious. Recommend follow-up chest CT in 3 months to ensure resolution. 3. Severe age-indeterminate compression deformity of T8 and moderate age indeterminate compression deformity of T9. Correlate for point  tenderness. 4. Aortic Atherosclerosis (ICD10-I70.0) and Emphysema (ICD10-J43.9). Electronically Signed   By: Allegra Lai M.D.   On: 06/12/2023 14:42   DG CHEST PORT 1 VIEW  Result Date: 06/11/2023 CLINICAL DATA:  Dyspnea. EXAM: PORTABLE CHEST 1 VIEW COMPARISON:  06/10/2023 FINDINGS: Exam detailed diminished due to rotational artifact. There is a right arm PICC line with tip in the projection of the SVC. Dual lead left chest wall pacer device is identified. The right ventricular lead appears in appropriate position. The left atrial lead projects over the expected location of the junction between the right atrium and right ventricle. Right lung appears clear. Persistent opacification within the left lower lung which appears increased from previous exam. This may reflect progressive atelectasis and or pleural effusion. IMPRESSION: Persistent opacification within the left lower lung which appears increased from previous exam. This may reflect progressive atelectasis and or pleural effusion. Left chest wall dual lead pacer with possible malpositioned right atrial lead. Clinical correlation advised. Electronically Signed   By: Signa Kell M.D.   On: 06/11/2023 17:13   DG CHEST PORT 1 VIEW  Result Date: 06/10/2023 CLINICAL DATA:  Central line placement EXAM: PORTABLE CHEST 1 VIEW COMPARISON:  06/24/2023 FINDINGS: Normal heart size and mediastinal contours. Dual-chamber pacer leads from the left. The right atrial lead is somewhat more vertical but this is likely due to inflation and positioning. Right PICC with tip at the SVC. Hazy density at the bases greater on the left where there is pleural fluid and volume loss. IMPRESSION: 1. Unremarkable central line placement. 2. Stable opacification at the left more than right base. Electronically Signed   By: Tiburcio Pea M.D.   On: 06/10/2023 11:24   CT ABDOMEN PELVIS W CONTRAST  Result Date: 06/10/2023 CLINICAL DATA:  Sepsis. EXAM:  CT ABDOMEN AND PELVIS  WITH CONTRAST TECHNIQUE: Multidetector CT imaging of the abdomen and pelvis was performed using the standard protocol following bolus administration of intravenous contrast. RADIATION DOSE REDUCTION: This exam was performed according to the departmental dose-optimization program which includes automated exposure control, adjustment of the mA and/or kV according to patient size and/or use of iterative reconstruction technique. CONTRAST:  75mL OMNIPAQUE IOHEXOL 350 MG/ML SOLN COMPARISON:  12/30/2020. FINDINGS: Lower chest: The heart is enlarged and coronary artery calcifications are noted. Pacemaker leads are noted in the heart. There are small bilateral pleural effusions. Mild atelectasis or infiltrate is present at the left lung base. Hepatobiliary: No focal liver abnormality is seen. Fatty infiltration of the liver is noted. No gallstones, gallbladder wall thickening, or biliary dilatation. Pancreas: Unremarkable. No pancreatic ductal dilatation or surrounding inflammatory changes. Spleen: Normal in size without focal abnormality. Adrenals/Urinary Tract: No adrenal nodule or mass. Left renal atrophy is noted. Cysts are present in the left kidney. There is perinephric edema on the right. Calcification is noted in the upper pole of the left kidney, possible vascular calcification versus renal calculus. No hydroureteronephrosis bilaterally. The urinary bladder is distended. Stomach/Bowel: Stomach is within normal limits. Appendix is not seen. No evidence of bowel wall thickening, distention, or inflammatory changes. No free air or pneumatosis. Scattered diverticula are present along the colon without evidence of diverticulitis. Vascular/Lymphatic: Aortic atherosclerosis. No enlarged abdominal or pelvic lymph nodes. Reproductive: Status post hysterectomy. No adnexal masses. Other: No abdominopelvic ascites. Musculoskeletal: Degenerative changes are present in the thoracolumbar spine. Stable compression deformities are  present in the thoracolumbar spine. No acute osseous abnormality. IMPRESSION: 1. Mild atelectasis or infiltrate at the left lung base. 2. Small bilateral pleural effusions. 3. Hepatic steatosis. 4. Colonic diverticulosis without diverticulitis. 5. Aortic atherosclerosis. Electronically Signed   By: Thornell Sartorius M.D.   On: 06/10/2023 00:16   [place] EKG SITE RITE  Result Date: 06/19/2023 If Site Rite image not attached, placement could not be confirmed due to current cardiac rhythm.  DG CHEST PORT 1 VIEW  Result Date: 06-19-2023 CLINICAL DATA:  Aspiration into airway EXAM: PORTABLE CHEST 1 VIEW COMPARISON:  Chest x-ray 06/06/2023. FINDINGS: Similar small left pleural effusion with overlying left basilar opacities. No visible pneumothorax. Similar cardiomediastinal silhouette. Left subclavian approach cardiac rhythm is device. IMPRESSION: Similar small left pleural effusion with overlying left basilar atelectasis and/or consolidation. Electronically Signed   By: Feliberto Harts M.D.   On: 06-19-23 15:31   ECHO TEE  Result Date: June 19, 2023    TRANSESOPHOGEAL ECHO REPORT   Patient Name:   VALKYRIE DEROSE Date of Exam: 06/19/23 Medical Rec #:  [id number]          Height:       60.0 in Accession #:    [id number]         Weight:       120.4 lb Date of Birth:  09/14/1941           BSA:          1.504 m Patient Age:    81 years           BP:           137/89 mmHg Patient Gender: F                  HR:           80 bpm. Exam Location:  Inpatient Procedure: Transesophageal Echo, Color Doppler, Cardiac Doppler and 3D  Echo Indications:     Bacteremia  History:         Patient has prior history of Echocardiogram examinations, most                  recent 06/04/2023. CHF, CAD, Pacemaker, COPD and history of                  Rheumatic fever, Arrythmias:Atrial Fibrillation,                  Signs/Symptoms:Edema; Risk Factors:Hypertension, Dyslipidemia                  and Current Smoker.  Sonographer:     Delcie Roch RDCS Referring Phys:  [id number] Graciella Freer Diagnosing Phys: Lennie Odor MD PROCEDURE: After discussion of the risks and benefits of a TEE, an informed consent was obtained from the patient. TEE procedure time was 7 minutes. The transesophogeal probe was passed without difficulty through the esophogus of the patient. Imaged were  obtained with the patient in a left lateral decubitus position. Sedation performed by different physician. The patient was monitored while under deep sedation. Anesthestetic sedation was provided intravenously by Anesthesiology: 30mg  of Propofol, 50mg  of Lidocaine. Image quality was excellent. The patient's vital signs; including heart rate, blood pressure, and oxygen saturation; remained stable throughout the procedure. The patient developed no complications during the procedure.  IMPRESSIONS  1. No evidence of endocarditis on the ppm leads.  2. Left ventricular ejection fraction, by estimation, is 55 to 60%. The left ventricle has normal function. There is the interventricular septum is flattened in systole and diastole, consistent with right ventricular pressure and volume overload.  3. Right ventricular systolic function is mildly reduced. The right ventricular size is moderately enlarged.  4. Left atrial size was severely dilated. No left atrial/left atrial appendage thrombus was detected.  5. Right atrial size was severely dilated.  6. The mitral valve is normal in structure. Mild to moderate mitral valve regurgitation. No evidence of mitral stenosis.  7. Severe, torrential TR. Septal leaflet appears impinged on 3D assessment with dilation of the posterior annulus and incomplete coaptation. RA bows into the LA, consistent with elevated RA pressure. The tricuspid valve is abnormal. Tricuspid valve regurgitation is severe.  8. The aortic valve is tricuspid. Aortic valve regurgitation is not visualized. No aortic stenosis is present.  9. There is mild (Grade II)  layered plaque involving the descending aorta. Conclusion(s)/Recommendation(s): No evidence of vegetation/infective endocarditis on this transesophageael echocardiogram. FINDINGS  Left Ventricle: Left ventricular ejection fraction, by estimation, is 55 to 60%. The left ventricle has normal function. The left ventricular internal cavity size was normal in size. The interventricular septum is flattened in systole and diastole, consistent with right ventricular pressure and volume overload. Right Ventricle: The right ventricular size is moderately enlarged. No increase in right ventricular wall thickness. Right ventricular systolic function is mildly reduced. Left Atrium: Left atrial size was severely dilated. No left atrial/left atrial appendage thrombus was detected. Right Atrium: Right atrial size was severely dilated. Pericardium: There is no evidence of pericardial effusion. Mitral Valve: The mitral valve is normal in structure. Mild to moderate mitral valve regurgitation. No evidence of mitral valve stenosis. There is no evidence of mitral valve vegetation. Tricuspid Valve: Severe, torrential TR. Septal leaflet appears impinged on 3D assessment with dilation of the posterior annulus and incomplete coaptation. RA bows into the LA, consistent with elevated RA pressure. The tricuspid valve is abnormal.  Tricuspid valve regurgitation is severe. No evidence of tricuspid stenosis. There is no evidence of tricuspid valve vegetation. Aortic Valve: The aortic valve is tricuspid. Aortic valve regurgitation is not visualized. No aortic stenosis is present. There is no evidence of aortic valve vegetation. Pulmonic Valve: The pulmonic valve was normal in structure. Pulmonic valve regurgitation is trivial. No evidence of pulmonic stenosis. There is no evidence of pulmonic valve vegetation. Aorta: The aortic root and ascending aorta are structurally normal, with no evidence of dilitation. There is mild (Grade II) layered plaque  involving the descending aorta. IAS/Shunts: There is left bowing of the interatrial septum, suggestive of elevated right atrial pressure. No atrial level shunt detected by color flow Doppler. Additional Comments: A device lead is visualized in the right atrium and right ventricle.  AORTA Ao Root diam: 2.95 cm Ao Asc diam:  2.96 cm Lennie Odor MD Electronically signed by Lennie Odor MD Signature Date/Time: 06-22-23/9:33:42 AM    Final    EP STUDY  Result Date: Jun 22, 2023 See surgical note for result.  DG Chest Port 1 View  Result Date: 06/06/2023 CLINICAL DATA:  Shortness of breath EXAM: PORTABLE CHEST 1 VIEW COMPARISON:  06/13/2023 FINDINGS: No significant change in AP portable chest radiograph. Cardiomegaly with left chest multi lead pacer. Diffuse bilateral interstitial pulmonary opacity superimposed upon emphysema and small bilateral pleural effusions and or pleural thickening. No new airspace opacity. IMPRESSION: 1. No significant change in AP portable chest radiograph. 2. Cardiomegaly with diffuse bilateral interstitial pulmonary opacity superimposed upon emphysema, likely edema. No new airspace opacity. 3. Small bilateral pleural effusions and or pleural thickening. Electronically Signed   By: Jearld Lesch M.D.   On: 06/06/2023 09:28   CT THORACIC SPINE WO CONTRAST  Result Date: 06/05/2023 CLINICAL DATA:  Bacteremia, concern for vertebral impaction EXAM: CT THORACIC SPINE WITHOUT CONTRAST TECHNIQUE: Multidetector CT images of the thoracic were obtained using the standard protocol without intravenous contrast. RADIATION DOSE REDUCTION: This exam was performed according to the departmental dose-optimization program which includes automated exposure control, adjustment of the mA and/or kV according to patient size and/or use of iterative reconstruction technique. COMPARISON:  Chest radiograph dated 10/06/2022 FINDINGS: Alignment: Normal thoracic kyphosis. Vertebrae: Moderate anterior compression  fracture deformity at T8, chronic. Mild superior endplate compression fracture deformity at T9, chronic. Mild to moderate superior endplate compression fracture with Schmorl's node deformity at T12, chronic. No acute fracture is seen. Paraspinal and other soft tissues: Atherosclerotic calcifications of the aortic arch. Moderate three-vessel coronary atherosclerosis. Left subclavian ICD, incompletely visualized. Trace bilateral pleural effusions, right greater than left. Mild centrilobular and paraseptal emphysematous changes, upper lung predominant. Disc levels: Mild multilevel degenerative changes, most prominent at T8-9. Spinal canal remains patent. No fluid collections or destructive changes to suggest discitis osteomyelitis. IMPRESSION: No evidence of discitis osteomyelitis. Chronic compression fracture deformities, as above. Mild multilevel degenerative changes. Electronically Signed   By: Charline Bills M.D.   On: 06/05/2023 19:57   CT LUMBAR SPINE WO CONTRAST  Result Date: 06/05/2023 CLINICAL DATA:  Bacteremia, concern for vertebral infection EXAM: CT LUMBAR SPINE WITHOUT CONTRAST TECHNIQUE: Multidetector CT imaging of the lumbar spine was performed without intravenous contrast administration. Multiplanar CT image reconstructions were also generated. RADIATION DOSE REDUCTION: This exam was performed according to the departmental dose-optimization program which includes automated exposure control, adjustment of the mA and/or kV according to patient size and/or use of iterative reconstruction technique. COMPARISON:  None Available. FINDINGS: Segmentation: 5 lumbar type vertebral bodies. Alignment: Normal lumbar  lordosis.  Upper lumbar dextroscoliosis. Vertebrae: Mild superior endplate compression fracture deformity at L1, chronic. No acute fracture is seen. Paraspinal and other soft tissues: Nonobstructing left renal calculus and left renal cysts, poorly visualized due to motion degradation. Vascular  calcifications. Disc levels: Moderate multilevel degenerative changes, most prominent at L3-4 with an associated left paracentral disc protrusion (series 3/image 77), with mild spinal canal narrowing at this level. No fluid collections or destructive changes to suggest discitis osteomyelitis. IMPRESSION: No evidence of discitis osteomyelitis. Moderate multilevel degenerative changes, most prominent at L3-4, as described above. Electronically Signed   By: Charline Bills M.D.   On: 06/05/2023 19:54   ECHOCARDIOGRAM COMPLETE  Result Date: 06/04/2023    ECHOCARDIOGRAM REPORT   Patient Name:   LIESL OBEY Date of Exam: 06/04/2023 Medical Rec #:  [id number]          Height:       60.0 in Accession #:    [id number]         Weight:       128.5 lb Date of Birth:  February 14, 1941           BSA:          1.547 m Patient Age:    81 years           BP:           150/84 mmHg Patient Gender: F                  HR:           80 bpm. Exam Location:  Inpatient Procedure: 2D Echo, Color Doppler and Cardiac Doppler Indications:    Bactermia  History:        Patient has prior history of Echocardiogram examinations, most                 recent 07/20/2016. CHF, COPD, Arrythmias:Paroxysmal Atrial                 Tachycardia, Signs/Symptoms:Shortness of Breath; Risk                 Factors:Dyslipidemia and Current Smoker. Rheumatic Fever.  Sonographer:    Raeford Razor RDCS Referring Phys: [id number] SYLVESTER I OGBATA IMPRESSIONS  1. Left ventricular ejection fraction, by estimation, is 50 to 55%. The left ventricle has low normal function. The left ventricle has no regional wall motion abnormalities. There is severe asymmetric left ventricular hypertrophy of the inferior segment. Left ventricular diastolic parameters are indeterminate.  2. Right ventricular systolic function is mildly reduced. The right ventricular size is mildly enlarged. There is moderately elevated pulmonary artery systolic pressure.  3. Left atrial size was mildly dilated.   4. Right atrial size was severely dilated.  5. The mitral valve is normal in structure. Mild mitral valve regurgitation. No evidence of mitral stenosis.  6. 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  7. The aortic valve is normal in structure. Aortic valve regurgitation is not visualized. No aortic stenosis is present.  8. The inferior vena cava is dilated in size with <50% respiratory variability, suggesting right atrial pressure of 15 mmHg. FINDINGS  Left Ventricle: Left ventricular ejection fraction, by estimation, is 50 to 55%. The left ventricle has low normal function. The left ventricle has no regional wall motion abnormalities. The left ventricular internal cavity size was normal in size. There is severe asymmetric left ventricular hypertrophy of the inferior segment. Left ventricular diastolic parameters are  indeterminate. Right Ventricle: The right ventricular size is mildly enlarged. No increase in right ventricular wall thickness. Right ventricular systolic function is mildly reduced. There is moderately elevated pulmonary artery systolic pressure. The tricuspid regurgitant velocity is 3.11 m/s, and with an assumed right atrial pressure of 15 mmHg, the estimated right ventricular systolic pressure is 53.7 mmHg. Left Atrium: Left atrial size was mildly dilated. Right Atrium: Right atrial size was severely dilated. Pericardium: There is no evidence of pericardial effusion. Presence of epicardial fat layer. Mitral Valve: The mitral valve is normal in structure. Mild mitral valve regurgitation. No evidence of mitral valve stenosis. Tricuspid Valve: The tricuspid valve is normal in structure. 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 Aortic Valve: The aortic valve is normal in structure. Aortic valve regurgitation is not visualized. No aortic stenosis is present. Aortic valve peak gradient measures 5.0 mmHg. Pulmonic Valve: The pulmonic  valve was normal in structure. Pulmonic valve regurgitation is not visualized. No evidence of pulmonic stenosis. Aorta: The aortic root is normal in size and structure. Venous: The inferior vena cava is dilated in size with less than 50% respiratory variability, suggesting right atrial pressure of 15 mmHg. IAS/Shunts: No atrial level shunt detected by color flow Doppler.  LEFT VENTRICLE PLAX 2D LVIDd:         3.50 cm     Diastology LVIDs:         2.60 cm     LV e' medial:    8.59 cm/s LV PW:         1.60 cm     LV E/e' medial:  11.5 LV IVS:        1.20 cm     LV e' lateral:   12.10 cm/s LVOT diam:     1.90 cm     LV E/e' lateral: 8.1 LV SV:         41 LV SV Index:   26 LVOT Area:     2.84 cm  LV Volumes (MOD) LV vol d, MOD A2C: 52.2 ml LV vol d, MOD A4C: 46.4 ml LV vol s, MOD A2C: 23.5 ml LV vol s, MOD A4C: 23.0 ml LV SV MOD A2C:     28.7 ml LV SV MOD A4C:     46.4 ml LV SV MOD BP:      25.5 ml RIGHT VENTRICLE            IVC RV Basal diam:  2.90 cm    IVC diam: 2.30 cm RV S prime:     8.81 cm/s TAPSE (M-mode): 1.7 cm LEFT ATRIUM             Index        RIGHT ATRIUM           Index LA diam:        3.70 cm 2.39 cm/m   RA Area:     24.40 cm LA Vol (A2C):   63.9 ml 41.31 ml/m  RA Volume:   79.20 ml  51.20 ml/m LA Vol (A4C):   31.7 ml 20.49 ml/m LA Biplane Vol: 47.7 ml 30.84 ml/m  AORTIC VALVE AV Area (Vmax): 2.24 cm AV Vmax:        112.00 cm/s AV Peak Grad:   5.0 mmHg LVOT Vmax:      88.53 cm/s LVOT Vmean:     55.167 cm/s LVOT VTI:       0.143 m  AORTA Ao Root diam: 2.70 cm Ao  Asc diam:  3.40 cm MITRAL VALVE               TRICUSPID VALVE MV Area (PHT): 4.31 cm    TR Peak grad:   38.7 mmHg MV Decel Time: 176 msec    TR Vmax:        311.00 cm/s MR Peak grad: 73.3 mmHg MR Vmax:      428.00 cm/s  SHUNTS MV E velocity: 98.50 cm/s  Systemic VTI:  0.14 m                            Systemic Diam: 1.90 cm Kardie Tobb DO Electronically signed by Thomasene Ripple DO Signature Date/Time: 06/04/2023/2:01:46 PM    Final    DG  Abd 1 View  Result Date: 06/03/2023 CLINICAL DATA:  [id number] Abdominal pain [id number] EXAM: ABDOMEN - 1 VIEW COMPARISON:  None Available. FINDINGS: Two pacemaker leads terminate over the heart. No disproportionately dilated small bowel loops. Moderate to large volume of gas and stool throughout the colon. No evidence of pneumatosis or pneumoperitoneum. Small left pleural effusion. Marked lumbar degenerative disc disease. No radiopaque nephrolithiasis. IMPRESSION: 1. Nonobstructive bowel gas pattern. Moderate to large volume of gas and stool throughout the colon, which may indicate constipation. 2. Small left pleural effusion. Electronically Signed   By: Delbert Phenix M.D.   On: 06/03/2023 15:12   DG Chest 2 View  Result Date: 2023-06-10 CLINICAL DATA:  Shortness of breath. EXAM: CHEST - 2 VIEW COMPARISON:  Radiographs 05/29/2023 and 05/13/2023.  CT 09/22/2021. FINDINGS: 1538 hours. Two views obtained. The heart size and mediastinal contours are stable with aortic atherosclerosis. Left subclavian pacemaker leads project over the right ventricular apex and appear unchanged. Stable chronic pleural thickening at the left costophrenic angle, likely related to old left-sided rib fractures. The lungs appear clear. No evidence of significant pleural effusion or pneumothorax. Multiple thoracolumbar compression deformities are grossly unchanged. IMPRESSION: No evidence of acute cardiopulmonary process. 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 Electronically Signed   By: Carey Bullocks M.D.   On: 06/10/2023 16:23    Microbiology: Recent Results (from the past 240 hour(s))  Expectorated Sputum Assessment w Gram Stain, Rflx to Resp Cult     Status: None   Collection Time: 06/11/23  1:41 PM   Specimen: Sputum  Result Value Ref Range Status   Specimen Description SPUTUM  Final   Special Requests NONE  Final   Sputum evaluation   Final    Sputum specimen not acceptable for testing.   Please recollect.   RESULT CALLED TO, READ BACK BY AND VERIFIED WITH: RN SIERRA  Performed at [facility], [address], [place], [place] [address]    Report Status 06/11/2023 FINAL  Final     Signed: Joycelyn Das, MD 06/19/23

## 2023-06-25 DEATH — deceased

## 2023-07-17 ENCOUNTER — Ambulatory Visit: Payer: Self-pay | Admitting: Infectious Disease
